# Patient Record
Sex: Male | Born: 1960
Health system: Southern US, Community
[De-identification: ages and names within clinical notes are randomized; demographics above are authoritative.]

## PROBLEM LIST (undated history)

## (undated) DIAGNOSIS — M1990 Unspecified osteoarthritis, unspecified site: Secondary | ICD-10-CM

## (undated) DIAGNOSIS — Z87891 Personal history of nicotine dependence: Secondary | ICD-10-CM

## (undated) DIAGNOSIS — I10 Essential (primary) hypertension: Secondary | ICD-10-CM

## (undated) DIAGNOSIS — I82409 Acute embolism and thrombosis of unspecified deep veins of unspecified lower extremity: Secondary | ICD-10-CM

## (undated) DIAGNOSIS — F329 Major depressive disorder, single episode, unspecified: Secondary | ICD-10-CM

## (undated) DIAGNOSIS — M5430 Sciatica, unspecified side: Secondary | ICD-10-CM

## (undated) DIAGNOSIS — G5600 Carpal tunnel syndrome, unspecified upper limb: Secondary | ICD-10-CM

## (undated) DIAGNOSIS — C61 Malignant neoplasm of prostate: Secondary | ICD-10-CM

## (undated) DIAGNOSIS — R319 Hematuria, unspecified: Secondary | ICD-10-CM

## (undated) DIAGNOSIS — M549 Dorsalgia, unspecified: Secondary | ICD-10-CM

## (undated) DIAGNOSIS — R011 Cardiac murmur, unspecified: Secondary | ICD-10-CM

## (undated) DIAGNOSIS — F32A Depression, unspecified: Secondary | ICD-10-CM

## (undated) DIAGNOSIS — E119 Type 2 diabetes mellitus without complications: Secondary | ICD-10-CM

## (undated) DIAGNOSIS — N529 Male erectile dysfunction, unspecified: Secondary | ICD-10-CM

## (undated) DIAGNOSIS — F419 Anxiety disorder, unspecified: Secondary | ICD-10-CM

## (undated) DIAGNOSIS — I213 ST elevation (STEMI) myocardial infarction of unspecified site: Secondary | ICD-10-CM

## (undated) DIAGNOSIS — R7611 Nonspecific reaction to tuberculin skin test without active tuberculosis: Secondary | ICD-10-CM

## (undated) DIAGNOSIS — M4800 Spinal stenosis, site unspecified: Secondary | ICD-10-CM

## (undated) DIAGNOSIS — E785 Hyperlipidemia, unspecified: Secondary | ICD-10-CM

## (undated) DIAGNOSIS — G8929 Other chronic pain: Secondary | ICD-10-CM

## (undated) DIAGNOSIS — M797 Fibromyalgia: Secondary | ICD-10-CM

## (undated) HISTORY — PX: BACK SURGERY: SHX140

## (undated) HISTORY — DX: Carpal tunnel syndrome, unspecified upper limb: G56.00

## (undated) HISTORY — DX: Spinal stenosis, site unspecified: M48.00

## (undated) HISTORY — DX: Sciatica, unspecified side: M54.30

---

## 1993-02-04 HISTORY — PX: APPENDECTOMY: SHX54

## 2001-02-04 HISTORY — PX: OTHER SURGICAL HISTORY: SHX169

## 2012-03-26 ENCOUNTER — Other Ambulatory Visit: Payer: Self-pay | Admitting: Urology

## 2012-04-08 ENCOUNTER — Encounter (HOSPITAL_BASED_OUTPATIENT_CLINIC_OR_DEPARTMENT_OTHER): Admission: RE | Payer: Self-pay | Source: Ambulatory Visit

## 2012-04-08 ENCOUNTER — Ambulatory Visit (HOSPITAL_BASED_OUTPATIENT_CLINIC_OR_DEPARTMENT_OTHER): Admission: RE | Admit: 2012-04-08 | Payer: Self-pay | Source: Ambulatory Visit | Admitting: Urology

## 2012-04-08 SURGERY — BIOPSY, PROSTATE, RECTAL APPROACH, WITH US GUIDANCE
Anesthesia: Monitor Anesthesia Care

## 2012-10-06 ENCOUNTER — Emergency Department (HOSPITAL_COMMUNITY): Payer: No Typology Code available for payment source

## 2012-10-06 ENCOUNTER — Emergency Department (HOSPITAL_COMMUNITY)
Admission: EM | Admit: 2012-10-06 | Discharge: 2012-10-06 | Disposition: A | Discharge status: Home / Self Care | Payer: No Typology Code available for payment source | Attending: Emergency Medicine | Admitting: Emergency Medicine

## 2012-10-06 ENCOUNTER — Encounter (HOSPITAL_COMMUNITY): Payer: Self-pay | Admitting: Emergency Medicine

## 2012-10-06 DIAGNOSIS — R1013 Epigastric pain: Secondary | ICD-10-CM | POA: Insufficient documentation

## 2012-10-06 DIAGNOSIS — E119 Type 2 diabetes mellitus without complications: Secondary | ICD-10-CM | POA: Insufficient documentation

## 2012-10-06 DIAGNOSIS — Z87891 Personal history of nicotine dependence: Secondary | ICD-10-CM | POA: Insufficient documentation

## 2012-10-06 DIAGNOSIS — R109 Unspecified abdominal pain: Secondary | ICD-10-CM

## 2012-10-06 DIAGNOSIS — Z9089 Acquired absence of other organs: Secondary | ICD-10-CM | POA: Insufficient documentation

## 2012-10-06 DIAGNOSIS — Z79899 Other long term (current) drug therapy: Secondary | ICD-10-CM | POA: Insufficient documentation

## 2012-10-06 DIAGNOSIS — R110 Nausea: Secondary | ICD-10-CM | POA: Insufficient documentation

## 2012-10-06 DIAGNOSIS — R739 Hyperglycemia, unspecified: Secondary | ICD-10-CM

## 2012-10-06 DIAGNOSIS — I10 Essential (primary) hypertension: Secondary | ICD-10-CM | POA: Insufficient documentation

## 2012-10-06 HISTORY — DX: Essential (primary) hypertension: I10

## 2012-10-06 LAB — URINALYSIS W MICROSCOPIC + REFLEX CULTURE
Glucose, UA: NEGATIVE mg/dL
Hgb urine dipstick: NEGATIVE
Ketones, ur: NEGATIVE mg/dL
Protein, ur: NEGATIVE mg/dL
Urine-Other: NONE SEEN
Urobilinogen, UA: 1 mg/dL (ref 0.0–1.0)

## 2012-10-06 LAB — CBC WITH DIFFERENTIAL/PLATELET
Basophils Relative: 0 % (ref 0–1)
Eosinophils Absolute: 0.2 10*3/uL (ref 0.0–0.7)
Eosinophils Relative: 3 % (ref 0–5)
Hemoglobin: 12.8 g/dL — ABNORMAL LOW (ref 13.0–17.0)
Lymphs Abs: 2.3 10*3/uL (ref 0.7–4.0)
MCH: 27.4 pg (ref 26.0–34.0)
MCHC: 32.3 g/dL (ref 30.0–36.0)
MCV: 84.8 fL (ref 78.0–100.0)
Monocytes Relative: 8 % (ref 3–12)
Neutrophils Relative %: 64 % (ref 43–77)
Platelets: 254 10*3/uL (ref 150–400)
RBC: 4.67 MIL/uL (ref 4.22–5.81)

## 2012-10-06 LAB — COMPREHENSIVE METABOLIC PANEL
GFR calc Af Amer: >90 mL/min (ref >90)
GFR calc non Af Amer: >90 mL/min (ref >90)
Potassium: 3.6 mEq/L (ref 3.5–5.1)
Sodium: 131 mEq/L — ABNORMAL LOW (ref 135–145)
Total Bilirubin: 0.8 mg/dL (ref 0.3–1.2)
Total Protein: 7.4 g/dL (ref 6.0–8.3)

## 2012-10-06 LAB — CG4 I-STAT (LACTIC ACID): Lactic Acid, Venous: 1.43 mmol/L (ref 0.5–2.2)

## 2012-10-06 MED ORDER — HYDROMORPHONE HCL PF 1 MG/ML IJ SOLN
0.5000 mg | Freq: Once | INTRAMUSCULAR | Status: AC
  Administered 2012-10-06: 0.5 mg via INTRAVENOUS
  Filled 2012-10-06: qty 1

## 2012-10-06 MED ORDER — IOHEXOL 300 MG/ML  SOLN
100.0000 mL | Freq: Once | INTRAMUSCULAR | Status: AC | PRN
  Administered 2012-10-06: 100 mL via INTRAVENOUS

## 2012-10-06 MED ORDER — IOHEXOL 300 MG/ML  SOLN
50.0000 mL | Freq: Once | INTRAMUSCULAR | Status: AC | PRN
  Administered 2012-10-06: 50 mL via ORAL

## 2012-10-06 MED ORDER — POLYETHYLENE GLYCOL 3350 17 GM/SCOOP PO POWD: 17.0000 g | Freq: Every day | ORAL | Status: DC

## 2012-10-06 MED ORDER — HYDROCODONE-ACETAMINOPHEN 5-325 MG PO TABS: 1.0000 | ORAL_TABLET | ORAL | Status: DC | PRN

## 2012-10-06 MED ORDER — FAMOTIDINE 20 MG PO TABS: 20.0000 mg | ORAL_TABLET | Freq: Two times a day (BID) | ORAL | Status: DC

## 2012-10-06 MED ORDER — ONDANSETRON HCL 4 MG/2ML IJ SOLN
4.0000 mg | Freq: Once | INTRAMUSCULAR | Status: AC
  Administered 2012-10-06: 4 mg via INTRAVENOUS
  Filled 2012-10-06: qty 2

## 2012-10-06 NOTE — ED Notes (Signed)
Patient is alert and oriented x3.  He was given DC instructions and follow up visit instructions.  Patient gave verbal understanding.  He was DC ambulatory under his own power to home.  V/S stable.  He was not showing any signs of distress on DC 

## 2012-10-06 NOTE — ED Provider Notes (Signed)
CSN: [id number]     Arrival date & time 10/06/12  1547 History   First MD Initiated Contact with Patient 10/06/12 1552     Chief Complaint  Patient presents with  . Abdominal Pain   (Consider location/radiation/quality/duration/timing/severity/associated sxs/prior Treatment) HPI [name] is a 52 y.o. male who presents to ED with complaint of abdominal pain. Pt states pain began last night. It is constant, rated 7/10, with episodes of sharp pain up to 10/10. States abdomen feels distended, associated nausea. Denies vomiting. States noted that his stools were black today. States that he did take pepto bismol for pain yesterday. Pt states he recently stopped metformin, and started on amaryl and not sure if that is what causing him to have pain. Pt went to PCP today and was sent here for evaluation.    Past Medical History  Diagnosis Date  . Diabetes mellitus without complication   . Hypertension    Past Surgical History  Procedure Laterality Date  . Appendectomy     No family history on file. History  Substance Use Topics  . Smoking status: Former Games developer  . Smokeless tobacco: Not on file  . Alcohol Use: Yes     Comment: social    Review of Systems  Constitutional: Negative for fever and chills.  HENT: Negative for neck pain and neck stiffness.   Respiratory: Negative for cough, chest tightness and shortness of breath.   Cardiovascular: Negative for chest pain, palpitations and leg swelling.  Gastrointestinal: Positive for nausea and abdominal pain. Negative for vomiting, diarrhea, constipation and abdominal distention.  Genitourinary: Negative for dysuria, urgency, frequency and hematuria.  Musculoskeletal: Negative for myalgias and arthralgias.  Skin: Negative for rash.  Allergic/Immunologic: Negative for immunocompromised state.  Neurological: Negative for dizziness, weakness, light-headedness, numbness and headaches.    Allergies  Review of patient's allergies  indicates no known allergies.  Home Medications   Current Outpatient Rx  Name  Route  Sig  Dispense  Refill  . bismuth subsalicylate (PEPTO BISMOL) 262 MG/15ML suspension   Oral   Take 15 mLs by mouth every 6 (six) hours as needed for indigestion.         Marland Kitchen glimepiride (AMARYL) 1 MG tablet   Oral   Take 1 mg by mouth daily before breakfast.         . lisinopril (PRINIVIL,ZESTRIL) 10 MG tablet   Oral   Take 10 mg by mouth daily.         . nabumetone (RELAFEN) 500 MG tablet   Oral   Take 500 mg by mouth 2 (two) times daily.         Marland Kitchen oxymetazoline (AFRIN) 0.05 % nasal spray   Nasal   Place 1 spray into the nose 2 (two) times daily.          There were no vitals taken for this visit. Physical Exam  Nursing note and vitals reviewed. Constitutional: He appears well-developed and well-nourished. No distress.  HENT:  Head: Normocephalic and atraumatic.  Eyes: Conjunctivae are normal.  Neck: Neck supple.  Cardiovascular: Normal rate, regular rhythm and normal heart sounds.   Pulmonary/Chest: Effort normal. No respiratory distress. He has no wheezes. He has no rales.  Abdominal: He exhibits distension. There is tenderness. There is guarding. There is no rebound.  Diffuse tenderness, with most tender area in epigastric region.   Musculoskeletal: He exhibits no edema.  Neurological: He is alert.  Skin: Skin is warm and dry.    ED Course  Procedures (including critical care time) Results for orders placed during the hospital encounter of 10/06/12  COMPREHENSIVE METABOLIC PANEL      Result Value Range   Sodium 131 (*) 135 - 145 mEq/L   Potassium 3.6  3.5 - 5.1 mEq/L   Chloride 99  96 - 112 mEq/L   CO2 24  19 - 32 mEq/L   Glucose, Bld 194 (*) 70 - 99 mg/dL   BUN 10  6 - 23 mg/dL   Creatinine, Ser 1.61  0.50 - 1.35 mg/dL   Calcium 8.4  8.4 - 09.6 mg/dL   Total Protein 7.4  6.0 - 8.3 g/dL   Albumin 3.4 (*) 3.5 - 5.2 g/dL   AST 16  0 - 37 U/L   ALT 17  0 - 53 U/L    Alkaline Phosphatase 73  39 - 117 U/L   Total Bilirubin 0.8  0.3 - 1.2 mg/dL   GFR calc non Af Amer >90  >90 mL/min   GFR calc Af Amer >90  >90 mL/min  LIPASE, BLOOD      Result Value Range   Lipase 34  11 - 59 U/L  CBC WITH DIFFERENTIAL      Result Value Range   WBC 8.6  4.0 - 10.5 K/uL   RBC 4.67  4.22 - 5.81 MIL/uL   Hemoglobin 12.8 (*) 13.0 - 17.0 g/dL   HCT 04.5  40.9 - 81.1 %   MCV 84.8  78.0 - 100.0 fL   MCH 27.4  26.0 - 34.0 pg   MCHC 32.3  30.0 - 36.0 g/dL   RDW 91.4  78.2 - 95.6 %   Platelets 254  150 - 400 K/uL   Neutrophils Relative % 64  43 - 77 %   Neutro Abs 5.5  1.7 - 7.7 K/uL   Lymphocytes Relative 26  12 - 46 %   Lymphs Abs 2.3  0.7 - 4.0 K/uL   Monocytes Relative 8  3 - 12 %   Monocytes Absolute 0.7  0.1 - 1.0 K/uL   Eosinophils Relative 3  0 - 5 %   Eosinophils Absolute 0.2  0.0 - 0.7 K/uL   Basophils Relative 0  0 - 1 %   Basophils Absolute 0.0  0.0 - 0.1 K/uL  CG4 I-STAT (LACTIC ACID)      Result Value Range   Lactic Acid, Venous 1.43  0.5 - 2.2 mmol/L  OCCULT BLOOD, POC DEVICE      Result Value Range   Fecal Occult Bld NEGATIVE  NEGATIVE   Ct Abdomen Pelvis W Contrast  10/06/2012   *RADIOLOGY REPORT*  Clinical Data: Abdominal pain centered at left lower quadrant with rebound.  Change in bowel habits.  Diabetes.  Hypertension.  CT ABDOMEN AND PELVIS WITH CONTRAST  Technique:  Multidetector CT imaging of the abdomen and pelvis was performed following the standard protocol during bolus administration of intravenous contrast.  Contrast: 50mL OMNIPAQUE IOHEXOL 300 MG/ML  SOLN, OMNIPAQUE IOHEXOL 300 MG/ML  SOLN  Comparison: Plain films earlier today.  No prior CT.  Findings: Lung bases:  Clear lung bases.  Normal heart size without pericardial or pleural effusion.  Abdomen/pelvis:  Mild hepatic steatosis.  Mild hepatomegaly, 19.6 cm cranial caudal.  Mild motion degradation in the upper abdomen. Normal spleen, stomach, pancreas, gallbladder, biliary tract,  adrenal glands.  Interpolar 9 mm left renal lesion which is too small to characterize.  Likely a cyst or minimally complex cyst. Normal right  kidney.  No retroperitoneal or retrocrural adenopathy.  Normal colon and terminal ileum. Appendix surgically absent. Normal small bowel without abdominal ascites.    No pelvic adenopathy.    Normal urinary bladder and prostate.  No significant free fluid.  Bones/Musculoskeletal:  Incomplete fusion of the posterior elements at L5. No acute osseous abnormality.  IMPRESSION:  1. No acute process in the abdomen or pelvis. 2.  Mild hepatic steatosis and hepatomegaly.   Original Report Authenticated By: Jeronimo Greaves, M.D.   Dg Abd Acute W/chest  10/06/2012   CLINICAL DATA:  52 year old male abdominal pain.  EXAM: ACUTE ABDOMEN SERIES (ABDOMEN 2 VIEW & CHEST 1 VIEW)  COMPARISON:  Lumbar radiographs 06/24/2012.  FINDINGS: Mild cardiomegaly. Other mediastinal contours are within normal limits. No pneumothorax or pneumoperitoneum. No confluent pulmonary opacity.  Non obstructed bowel gas pattern. Abdominal and pelvic visceral contours are within normal limits. No acute osseous abnormality identified.  IMPRESSION: Non obstructed bowel gas pattern, no free air.  No acute cardiopulmonary abnormality.   Electronically Signed   By: Augusto Gamble   On: 10/06/2012 18:00      MDM   1. Abdominal pain   2. Hyperglycemia      PT in ED with abdominal pain onset last night. Nausea. Abdominal distention. Sent from PCP's office. On exam, initially pt's abdomen is distended, tender diffusely with main pain in epigastric area. Some guarding. CT and labs obtained.   CT normal. CBC including WBC normal. The rest of the labs unremarkable. UA shows elevated specific gravity otherwise unremarkable. Hemoccult negative negative lactic acid. Given the above findings I do not suspect a surgical abdomen. A shunt will need further followup by primary care doctor gastroenterologist. Patient will be  discharged home with pain medications, Pepcid, followup is as able.  Filed Vitals:   10/06/12 1821  BP: 131/88  Pulse: 75  Resp: 18  SpO2: 99%     Lottie Mussel, PA-C 10/07/12 0056

## 2012-10-06 NOTE — ED Notes (Signed)
Per EMS pt comes from [place] [facility] c/o abdominal pain started in [place] and has rebound, abd is distended.  Pain starts lower then intermittently radiates sharply up to epigastric area. Pt denies chest pain and ShoB.  Pt recently was stopped of Metformin due to pain in his body.  Pt states that he had dark tarry stools yesterday that were loose and abd pain started last night. Pt also states that when he urinates the flow is slower than usual, denies pain.

## 2012-10-07 LAB — URINALYSIS W MICROSCOPIC + REFLEX CULTURE

## 2012-10-14 NOTE — ED Provider Notes (Signed)
Medical screening examination/treatment/procedure(s) were performed by non-physician practitioner and as supervising physician I was immediately available for consultation/collaboration. Devoria Albe, MD, Armando Gang   Ward Givens, MD 10/14/12 [phone]

## 2012-12-16 ENCOUNTER — Encounter: Payer: Self-pay | Admitting: Physical Medicine & Rehabilitation

## 2012-12-16 ENCOUNTER — Other Ambulatory Visit: Payer: Self-pay | Admitting: Rheumatology

## 2012-12-16 DIAGNOSIS — M545 Low back pain: Secondary | ICD-10-CM

## 2012-12-22 ENCOUNTER — Ambulatory Visit
Admission: RE | Admit: 2012-12-22 | Discharge: 2012-12-22 | Disposition: A | Discharge status: Home / Self Care | Payer: No Typology Code available for payment source | Source: Ambulatory Visit | Attending: Rheumatology | Admitting: Rheumatology

## 2012-12-22 DIAGNOSIS — M545 Low back pain: Secondary | ICD-10-CM

## 2012-12-24 ENCOUNTER — Other Ambulatory Visit: Payer: Self-pay | Admitting: Rheumatology

## 2012-12-24 DIAGNOSIS — M4800 Spinal stenosis, site unspecified: Secondary | ICD-10-CM

## 2012-12-28 ENCOUNTER — Ambulatory Visit
Admission: RE | Admit: 2012-12-28 | Discharge: 2012-12-28 | Disposition: A | Discharge status: Home / Self Care | Payer: No Typology Code available for payment source | Source: Ambulatory Visit | Attending: Rheumatology | Admitting: Rheumatology

## 2012-12-28 DIAGNOSIS — M4800 Spinal stenosis, site unspecified: Secondary | ICD-10-CM

## 2012-12-28 MED ORDER — IOHEXOL 180 MG/ML  SOLN
1.0000 mL | Freq: Once | INTRAMUSCULAR | Status: AC | PRN
  Administered 2012-12-28: 1 mL via EPIDURAL

## 2012-12-28 MED ORDER — METHYLPREDNISOLONE ACETATE 40 MG/ML INJ SUSP (RADIOLOG
120.0000 mg | Freq: Once | INTRAMUSCULAR | Status: AC
  Administered 2012-12-28: 120 mg via EPIDURAL

## 2013-01-17 ENCOUNTER — Emergency Department (HOSPITAL_COMMUNITY): Payer: Self-pay

## 2013-01-17 ENCOUNTER — Encounter (HOSPITAL_COMMUNITY): Payer: Self-pay | Admitting: Emergency Medicine

## 2013-01-17 ENCOUNTER — Emergency Department (HOSPITAL_COMMUNITY)
Admission: EM | Admit: 2013-01-17 | Discharge: 2013-01-17 | Disposition: A | Discharge status: Home / Self Care | Payer: Self-pay | Attending: Emergency Medicine | Admitting: Emergency Medicine

## 2013-01-17 DIAGNOSIS — I10 Essential (primary) hypertension: Secondary | ICD-10-CM | POA: Insufficient documentation

## 2013-01-17 DIAGNOSIS — R0789 Other chest pain: Secondary | ICD-10-CM | POA: Insufficient documentation

## 2013-01-17 DIAGNOSIS — Z87891 Personal history of nicotine dependence: Secondary | ICD-10-CM | POA: Insufficient documentation

## 2013-01-17 DIAGNOSIS — R079 Chest pain, unspecified: Secondary | ICD-10-CM

## 2013-01-17 DIAGNOSIS — E119 Type 2 diabetes mellitus without complications: Secondary | ICD-10-CM | POA: Insufficient documentation

## 2013-01-17 DIAGNOSIS — Z79899 Other long term (current) drug therapy: Secondary | ICD-10-CM | POA: Insufficient documentation

## 2013-01-17 LAB — BASIC METABOLIC PANEL
BUN: 14 mg/dL (ref 6–23)
CO2: 24 mEq/L (ref 19–32)
Calcium: 9.2 mg/dL (ref 8.4–10.5)
Creatinine, Ser: 1.01 mg/dL (ref 0.50–1.35)
Glucose, Bld: 269 mg/dL — ABNORMAL HIGH (ref 70–99)
Potassium: 4 mEq/L (ref 3.5–5.1)

## 2013-01-17 LAB — POCT I-STAT TROPONIN I: Troponin i, poc: 0 ng/mL (ref 0.00–0.08)

## 2013-01-17 LAB — CBC
HCT: 40.5 % (ref 39.0–52.0)
Hemoglobin: 13.6 g/dL (ref 13.0–17.0)
MCH: 28.4 pg (ref 26.0–34.0)
MCHC: 33.6 g/dL (ref 30.0–36.0)
MCV: 84.6 fL (ref 78.0–100.0)
Platelets: 261 10*3/uL (ref 150–400)
RBC: 4.79 MIL/uL (ref 4.22–5.81)
RDW: 12.3 % (ref 11.5–15.5)

## 2013-01-17 MED ORDER — NAPROXEN 375 MG PO TABS: 375.0000 mg | ORAL_TABLET | Freq: Two times a day (BID) | ORAL | Status: DC | PRN

## 2013-01-17 MED ORDER — KETOROLAC TROMETHAMINE 30 MG/ML IJ SOLN
30.0000 mg | Freq: Once | INTRAMUSCULAR | Status: AC
  Administered 2013-01-17: 30 mg via INTRAVENOUS
  Filled 2013-01-17: qty 1

## 2013-01-17 MED ORDER — LORAZEPAM 2 MG/ML IJ SOLN
1.0000 mg | Freq: Once | INTRAMUSCULAR | Status: AC
  Administered 2013-01-17: 1 mg via INTRAVENOUS
  Filled 2013-01-17: qty 1

## 2013-01-17 NOTE — ED Notes (Signed)
Pt complains of R chest pain since yesterday evening along with sob and lightheadedness. Pt describes pain as sharp, no aggravating/relieving factors. Pt also has bilateral shoulder pain, HA and leg numbness. Pt has chronic back pain. Pain worse with palpation.

## 2013-01-17 NOTE — ED Provider Notes (Signed)
CSN: [id number]     Arrival date & time 01/17/13  4098 History   First MD Initiated Contact with Patient 01/17/13 [phone]     Chief Complaint  Patient presents with  . Chest Pain   (Consider location/radiation/quality/duration/timing/severity/associated sxs/prior Treatment) HPI  52 year old male with chest pain. Gradual onset yesterday afternoon while sitting on the couch watching TV. Pain is along the right parasternal border. Patient describes the pain as sharp. It is constant. Does not radiate. No appreciable exacerbating relieving factors. No shortness of breath, palpitations, diaphoresis or nausea. No history similar pain. No fevers or chills. No cough. No unusual leg pain or swelling. No diagnosed history of coronary artery disease. He does have a history of diabetes, hypertension and a former smoker.  Past Medical History  Diagnosis Date  . Diabetes mellitus without complication   . Hypertension    Past Surgical History  Procedure Laterality Date  . Appendectomy     History reviewed. No pertinent family history. History  Substance Use Topics  . Smoking status: Former Games developer  . Smokeless tobacco: Not on file  . Alcohol Use: Yes     Comment: social    Review of Systems  All systems reviewed and negative, other than as noted in HPI.   Allergies  Review of patient's allergies indicates no known allergies.  Home Medications   Current Outpatient Rx  Name  Route  Sig  Dispense  Refill  . bismuth subsalicylate (PEPTO BISMOL) 262 MG/15ML suspension   Oral   Take 15 mLs by mouth every 6 (six) hours as needed for indigestion.         . famotidine (PEPCID) 20 MG tablet   Oral   Take 1 tablet (20 mg total) by mouth 2 (two) times daily.   30 tablet   0   . glimepiride (AMARYL) 1 MG tablet   Oral   Take 1 mg by mouth daily before breakfast.         . HYDROcodone-acetaminophen (NORCO/VICODIN) 5-325 MG per tablet   Oral   Take 1 tablet by mouth every 4 (four) hours  as needed for pain.   15 tablet   0   . lisinopril (PRINIVIL,ZESTRIL) 10 MG tablet   Oral   Take 10 mg by mouth daily.         . nabumetone (RELAFEN) 500 MG tablet   Oral   Take 500 mg by mouth 2 (two) times daily.         Marland Kitchen oxymetazoline (AFRIN) 0.05 % nasal spray   Nasal   Place 1 spray into the nose 2 (two) times daily.         . polyethylene glycol powder (MIRALAX) powder   Oral   Take 17 g by mouth daily.   255 g   0    BP 142/92  Pulse 88  Temp(Src) 98.3 F (36.8 C) (Oral)  Resp 20  SpO2 99% Physical Exam  Nursing note and vitals reviewed. Constitutional: He appears well-developed and well-nourished. No distress.  HENT:  Head: Normocephalic and atraumatic.  Eyes: Conjunctivae are normal. Right eye exhibits no discharge. Left eye exhibits no discharge.  Neck: Neck supple.  Cardiovascular: Normal rate, regular rhythm and normal heart sounds.  Exam reveals no gallop and no friction rub.   No murmur heard. Pulmonary/Chest: Effort normal and breath sounds normal. No respiratory distress. He exhibits tenderness.  Tenderness along the right parasternal border. No concerning skin changes noted.  Abdominal: Soft. He exhibits no  distension. There is no tenderness.  Musculoskeletal: He exhibits no edema and no tenderness.  Lower extremities symmetric as compared to each other. No calf tenderness. Negative Homan's. No palpable cords.   Neurological: He is alert.  Skin: Skin is warm and dry.  Psychiatric: He has a normal mood and affect. His behavior is normal. Thought content normal.    ED Course  Procedures (including critical care time) Labs Review Labs Reviewed  BASIC METABOLIC PANEL - Abnormal; Notable for the following:    Sodium 132 (*)    Glucose, Bld 269 (*)    GFR calc non Af Amer 85 (*)    All other components within normal limits  CBC  PRO B NATRIURETIC PEPTIDE  POCT I-STAT TROPONIN I   Imaging Review  No results found. No results  found.  Dg Chest 2 View  01/17/2013   CLINICAL DATA:  Right chest pain  EXAM: CHEST  2 VIEW  COMPARISON:  10/06/2012  FINDINGS: The heart size and mediastinal contours are within normal limits. Both lungs are clear. The visualized skeletal structures are unremarkable.  IMPRESSION: No active cardiopulmonary disease.   Electronically Signed   By: Ruel Favors M.D.   On: 01/17/2013 09:48     EKG Interpretation    Date/Time:  Sunday January 17 2013 09:04:30 EST Ventricular Rate:  86 PR Interval:  163 QRS Duration: 90 QT Interval:  352 QTC Calculation: 421 R Axis:   43 Text Interpretation:  Sinus rhythm EARLY R WAVE PROGRESSION NSST changes, t wave flattening multiple leads No old tracing to compare Confirmed by Kenetra Hildenbrand  MD, Daymeon Fischman ([id number]) on 01/17/2013 9:19:57 AM            MDM   1. Chest pain    52 year old male with chest pain. Very atypical for ACS given constant duration for more than a day in such reproducibility with palpation. Suspect musculoskeletal etiology such as costochondritis and will treat as such at this time. Doubt pulmonary embolism, infectious or dissection.     Raeford Razor, MD 01/20/13 [phone]

## 2013-01-19 ENCOUNTER — Ambulatory Visit: Payer: No Typology Code available for payment source | Admitting: Physical Medicine & Rehabilitation

## 2013-01-28 ENCOUNTER — Emergency Department (HOSPITAL_COMMUNITY)
Admission: EM | Admit: 2013-01-28 | Discharge: 2013-01-28 | Disposition: A | Discharge status: Home / Self Care | Payer: No Typology Code available for payment source | Attending: Emergency Medicine | Admitting: Emergency Medicine

## 2013-01-28 ENCOUNTER — Encounter (HOSPITAL_COMMUNITY): Payer: Self-pay | Admitting: Emergency Medicine

## 2013-01-28 DIAGNOSIS — Z87891 Personal history of nicotine dependence: Secondary | ICD-10-CM | POA: Insufficient documentation

## 2013-01-28 DIAGNOSIS — M545 Low back pain, unspecified: Secondary | ICD-10-CM | POA: Insufficient documentation

## 2013-01-28 DIAGNOSIS — E119 Type 2 diabetes mellitus without complications: Secondary | ICD-10-CM | POA: Insufficient documentation

## 2013-01-28 DIAGNOSIS — F3289 Other specified depressive episodes: Secondary | ICD-10-CM | POA: Insufficient documentation

## 2013-01-28 DIAGNOSIS — F329 Major depressive disorder, single episode, unspecified: Secondary | ICD-10-CM

## 2013-01-28 DIAGNOSIS — Z79899 Other long term (current) drug therapy: Secondary | ICD-10-CM | POA: Insufficient documentation

## 2013-01-28 DIAGNOSIS — Z791 Long term (current) use of non-steroidal anti-inflammatories (NSAID): Secondary | ICD-10-CM | POA: Insufficient documentation

## 2013-01-28 DIAGNOSIS — I10 Essential (primary) hypertension: Secondary | ICD-10-CM | POA: Insufficient documentation

## 2013-01-28 DIAGNOSIS — G8929 Other chronic pain: Secondary | ICD-10-CM | POA: Insufficient documentation

## 2013-01-28 MED ORDER — DIAZEPAM 5 MG PO TABS
5.0000 mg | ORAL_TABLET | Freq: Once | ORAL | Status: AC
  Administered 2013-01-28: 5 mg via ORAL
  Filled 2013-01-28: qty 1

## 2013-01-28 MED ORDER — OXYCODONE-ACETAMINOPHEN 5-325 MG PO TABS
2.0000 | ORAL_TABLET | Freq: Once | ORAL | Status: AC
  Administered 2013-01-28: 2 via ORAL
  Filled 2013-01-28: qty 2

## 2013-01-28 MED ORDER — DIAZEPAM 5 MG PO TABS: 5.0000 mg | ORAL_TABLET | Freq: Three times a day (TID) | ORAL | Status: DC | PRN

## 2013-01-28 NOTE — ED Notes (Signed)
Pt arrived to the Ed with a complaint of pain in his spine, legs, wrists.  Pt has spinal setosus.  Pt has codeine, naproxen for the pain but it has been ineffective.  Pt's pain began to become severe yesterday and he has been unable to sleep last night due to the pain.

## 2013-01-28 NOTE — ED Provider Notes (Signed)
CSN: [id number]     Arrival date & time 01/28/13  7829 History   First MD Initiated Contact with Patient 01/28/13 [phone]     Chief Complaint  Patient presents with  . Back Pain   (Consider location/radiation/quality/duration/timing/severity/associated sxs/prior Treatment) HPI Comments: Patient has been treated by Rheumatologist for chronic pain. Patient states his codeine hasn't been working. Patient has depression, denies SI.  Patient is a 52 y.o. male presenting with back pain. The history is provided by the patient.  Back Pain Location:  Thoracic spine and lumbar spine Quality:  Aching Radiates to:  Does not radiate Pain severity:  Moderate Pain is:  Same all the time Onset quality:  Sudden Timing:  Constant Progression:  Worsening Chronicity:  Chronic Context: not MCA, not MVA, not occupational injury, not recent illness and not recent injury   Relieved by:  Nothing Worsened by:  Nothing tried Associated symptoms: no abdominal pain and no fever     Past Medical History  Diagnosis Date  . Diabetes mellitus without complication   . Hypertension    Past Surgical History  Procedure Laterality Date  . Appendectomy     History reviewed. No pertinent family history. History  Substance Use Topics  . Smoking status: Former Games developer  . Smokeless tobacco: Not on file  . Alcohol Use: Yes     Comment: social    Review of Systems  Constitutional: Negative for fever.  Respiratory: Negative for cough and shortness of breath.   Gastrointestinal: Negative for nausea, vomiting and abdominal pain.  Musculoskeletal: Positive for back pain.  All other systems reviewed and are negative.    Allergies  Review of patient's allergies indicates no known allergies.  Home Medications   Current Outpatient Rx  Name  Route  Sig  Dispense  Refill  . glimepiride (AMARYL) 1 MG tablet   Oral   Take 1 mg by mouth daily before breakfast.         . HYDROcodone-acetaminophen  (NORCO/VICODIN) 5-325 MG per tablet   Oral   Take 1 tablet by mouth every 4 (four) hours as needed for pain.   15 tablet   0   . ibuprofen (ADVIL,MOTRIN) 200 MG tablet   Oral   Take 400 mg by mouth every 6 (six) hours as needed.         Marland Kitchen lisinopril (PRINIVIL,ZESTRIL) 10 MG tablet   Oral   Take 10 mg by mouth daily.         . nabumetone (RELAFEN) 500 MG tablet   Oral   Take 500 mg by mouth 2 (two) times daily.         . naproxen (NAPROSYN) 375 MG tablet   Oral   Take 1 tablet (375 mg total) by mouth 2 (two) times daily as needed.   30 tablet   0   . oxymetazoline (AFRIN) 0.05 % nasal spray   Nasal   Place 1 spray into the nose 2 (two) times daily.         . polyethylene glycol powder (MIRALAX) powder   Oral   Take 17 g by mouth daily.   255 g   0    BP 155/91  Pulse 85  Temp(Src) 98.3 F (36.8 C) (Oral)  Resp 20  Ht 5\' 11"  (1.803 m)  Wt 260 lb (117.935 kg)  BMI 36.28 kg/m2  SpO2 95% Physical Exam  Nursing note and vitals reviewed. Constitutional: He is oriented to person, place, and time. He appears well-developed  and well-nourished. No distress.  HENT:  Head: Normocephalic and atraumatic.  Mouth/Throat: No oropharyngeal exudate.  Eyes: EOM are normal. Pupils are equal, round, and reactive to light.  Neck: Normal range of motion. Neck supple.  Cardiovascular: Normal rate and regular rhythm.  Exam reveals no friction rub.   No murmur heard. Pulmonary/Chest: Effort normal and breath sounds normal. No respiratory distress. He has no wheezes. He has no rales.  Abdominal: He exhibits no distension. There is no tenderness. There is no rebound.  Musculoskeletal: Normal range of motion. He exhibits no edema.       Thoracic back: He exhibits spasm (in mid back).  Neurological: He is alert and oriented to person, place, and time. No cranial nerve deficit. He exhibits normal muscle tone. Coordination normal.  Skin: No rash noted. He is not diaphoretic.    ED  Course  Procedures (including critical care time) Labs Review Labs Reviewed - No data to display Imaging Review No results found.  EKG Interpretation   None       MDM   1. Chronic back pain   2. Depression    52 year old male presents with chronic pain. He's been followed by Dr. Kellie Simmering, who is a rheumatologist. Patient states he has spinal stenosis, carpal tunnel syndrome. He states he's on codeine 3 times a day is not helping his pain. Is also reporting depression. He denies any suicidal ideation or homicidal ideation. He really wants relief from his pain and wants some help with his depression. Patient denies any falls, acute worsening of his chronic pain from any trauma, fevers, nausea, vomiting. Patient's vitals are stable here. Exam is benign. We will give him 2 Percocet for pain relief.. I instructed him to continue taking his codeine at home as prescribed. Also given prescription for sign of muscle spasm as he reports muscle tightening in his back. We'll give him resource guide for followup for psychiatry. He is stable for discharge.    Dagmar Hait, MD 01/28/13 [phone]

## 2013-01-28 NOTE — ED Notes (Signed)
Pt reports pain in his lower back which radiates to bilateral legs. Pt also sts weakness in legs, reports hx of spinal stenosis.

## 2013-02-17 ENCOUNTER — Encounter (HOSPITAL_COMMUNITY): Payer: Self-pay | Admitting: Emergency Medicine

## 2013-02-17 ENCOUNTER — Emergency Department (HOSPITAL_COMMUNITY)
Admission: EM | Admit: 2013-02-17 | Discharge: 2013-02-17 | Disposition: A | Discharge status: Home / Self Care | Payer: No Typology Code available for payment source | Attending: Emergency Medicine | Admitting: Emergency Medicine

## 2013-02-17 DIAGNOSIS — IMO0001 Reserved for inherently not codable concepts without codable children: Secondary | ICD-10-CM | POA: Insufficient documentation

## 2013-02-17 DIAGNOSIS — Z79899 Other long term (current) drug therapy: Secondary | ICD-10-CM | POA: Insufficient documentation

## 2013-02-17 DIAGNOSIS — I10 Essential (primary) hypertension: Secondary | ICD-10-CM | POA: Insufficient documentation

## 2013-02-17 DIAGNOSIS — Z87891 Personal history of nicotine dependence: Secondary | ICD-10-CM | POA: Insufficient documentation

## 2013-02-17 DIAGNOSIS — M4800 Spinal stenosis, site unspecified: Secondary | ICD-10-CM

## 2013-02-17 DIAGNOSIS — G8929 Other chronic pain: Secondary | ICD-10-CM

## 2013-02-17 DIAGNOSIS — Z791 Long term (current) use of non-steroidal anti-inflammatories (NSAID): Secondary | ICD-10-CM | POA: Insufficient documentation

## 2013-02-17 DIAGNOSIS — M25519 Pain in unspecified shoulder: Secondary | ICD-10-CM | POA: Insufficient documentation

## 2013-02-17 DIAGNOSIS — F329 Major depressive disorder, single episode, unspecified: Secondary | ICD-10-CM | POA: Insufficient documentation

## 2013-02-17 DIAGNOSIS — M5430 Sciatica, unspecified side: Secondary | ICD-10-CM

## 2013-02-17 DIAGNOSIS — E119 Type 2 diabetes mellitus without complications: Secondary | ICD-10-CM | POA: Insufficient documentation

## 2013-02-17 DIAGNOSIS — F3289 Other specified depressive episodes: Secondary | ICD-10-CM | POA: Insufficient documentation

## 2013-02-17 MED ORDER — OXYCODONE-ACETAMINOPHEN 5-325 MG PO TABS: 1.0000 | ORAL_TABLET | Freq: Four times a day (QID) | ORAL | Status: DC | PRN

## 2013-02-17 MED ORDER — PREDNISONE 20 MG PO TABS: ORAL_TABLET | ORAL | Status: DC

## 2013-02-17 NOTE — ED Notes (Signed)
Patient c/o of generalized body aches, unwilling to sit at triage due to pain.  States he has previous diagnosis of spinal stenosis and carpal tunnel syndrome.  C/o radiating pain down leg bilaterally , thigh pain and buttock pain, shoulder pain was being seen for same and put out of work but has since lost insurance and has been unable to follow up.  C/o of numb fingers and toes in all extremities.  C/o pain in back when flexing neck

## 2013-02-17 NOTE — Discharge Instructions (Signed)
Take percocet for severe pain only. No driving or operating heavy machinery while taking percocet. This medication may cause drowsiness. Take prednisone as directed. Follow up with your doctor. It is important to monitor your blood sugar closely while on prednisone. Back Pain, Adult Back pain is very common. The pain often gets better over time. The cause of back pain is usually not dangerous. Most people can learn to manage their back pain on their own.  HOME CARE   Stay active. Start with short walks on flat ground if you can. Try to walk farther each day.  Do not sit, drive, or stand in one place for more than 30 minutes. Do not stay in bed.  Do not avoid exercise or work. Activity can help your back heal faster.  Be careful when you bend or lift an object. Bend at your knees, keep the object close to you, and do not twist.  Sleep on a firm mattress. Lie on your side, and bend your knees. If you lie on your back, put a pillow under your knees.  Only take medicines as told by your doctor.  Put ice on the injured area.  Put ice in a plastic bag.  Place a towel between your skin and the bag.  Leave the ice on for 15-20 minutes, 03-04 times a day for the first 2 to 3 days. After that, you can switch between ice and heat packs.  Ask your doctor about back exercises or massage.  Avoid feeling anxious or stressed. Find good ways to deal with stress, such as exercise. GET HELP RIGHT AWAY IF:   Your pain does not go away with rest or medicine.  Your pain does not go away in 1 week.  You have new problems.  You do not feel well.  The pain spreads into your legs.  You cannot control when you poop (bowel movement) or pee (urinate).  Your arms or legs feel weak or lose feeling (numbness).  You feel sick to your stomach (nauseous) or throw up (vomit).  You have belly (abdominal) pain.  You feel like you may pass out (faint). MAKE SURE YOU:   Understand these  instructions.  Will watch your condition.  Will get help right away if you are not doing well or get worse. Document Released: 07/10/2007 Document Revised: 04/15/2011 Document Reviewed: 06/11/2010 [facility] Patient Information 2014 [place].  Chronic Pain Chronic pain can be defined as pain that is off and on and lasts for 3 6 months or longer. Many things cause chronic pain, which can make it difficult to make a diagnosis. There are many treatment options available for chronic pain. However, finding a treatment that works well for you may require trying various approaches until the right one is found. Many people benefit from a combination of two or more types of treatment to control their pain. SYMPTOMS  Chronic pain can occur anywhere in the body and can range from mild to very severe. Some types of chronic pain include:  Headache.  Low back pain.  Cancer pain.  Arthritis pain.  Neurogenic pain. This is pain resulting from damage to nerves. People with chronic pain may also have other symptoms such as:  Depression.  Anger.  Insomnia.  Anxiety. DIAGNOSIS  Your health care provider will help diagnose your condition over time. In many cases, the initial focus will be on excluding possible conditions that could be causing the pain. Depending on your symptoms, your health care provider may order  tests to diagnose your condition. Some of these tests may include:   Blood tests.   CT scan.   MRI.   X-rays.   Ultrasounds.   Nerve conduction studies.  You may need to see a specialist.  TREATMENT  Finding treatment that works well may take time. You may be referred to a pain specialist. He or she may prescribe medicine or therapies, such as:   Mindful meditation or yoga.  Shots (injections) of numbing or pain-relieving medicines into the spine or area of pain.  Local electrical stimulation.  Acupuncture.   Massage therapy.   Aroma, color, light, or  sound therapy.   Biofeedback.   Working with a physical therapist to keep from getting stiff.   Regular, gentle exercise.   Cognitive or behavioral therapy.   Group support.  Sometimes, surgery may be recommended.  HOME CARE INSTRUCTIONS   Take all medicines as directed by your health care provider.   Lessen stress in your life by relaxing and doing things such as listening to calming music.   Exercise or be active as directed by your health care provider.   Eat a healthy diet and include things such as vegetables, fruits, fish, and lean meats in your diet.   Keep all follow-up appointments with your health care provider.   Attend a support group with others suffering from chronic pain. SEEK MEDICAL CARE IF:   Your pain gets worse.   You develop a new pain that was not there before.   You cannot tolerate medicines given to you by your health care provider.   You have new symptoms since your last visit with your health care provider.  SEEK IMMEDIATE MEDICAL CARE IF:   You feel weak.   You have decreased sensation or numbness.   You lose control of bowel or bladder function.   Your pain suddenly gets much worse.   You develop shaking.  You develop chills.  You develop confusion.  You develop chest pain.  You develop shortness of breath.  MAKE SURE YOU:  Understand these instructions.  Will watch your condition.  Will get help right away if you are not doing well or get worse. Document Released: 10/13/2001 Document Revised: 09/23/2012 Document Reviewed: 07/17/2012 [facility] Patient Information 2014 [place].

## 2013-02-17 NOTE — ED Provider Notes (Signed)
CSN: [id number]     Arrival date & time 02/17/13  0820 History   First MD Initiated Contact with Patient 02/17/13 [phone]     Chief Complaint  Patient presents with  . Generalized Body Aches   (Consider location/radiation/quality/duration/timing/severity/associated sxs/prior Treatment) HPI Comments: Pt is a 53 y/o male with a PMHx of DM, HTN and spinal stenosis who presents to the ED complaining of generalized body aches, back pain, shoulder pain, hip pain which has been going on for a few months, has been managed by PCP, however out of work and lost insurance and has been unable to f/u with neurosurgeon due to lack of insurance. He is complaining of radiating pain down bilateral legs, left worse than right, left buttock pain and bilateral shoulder pain. States he feels weak, gets occasional numbness and tingling down extremities. Pain worse when sitting or walking, alleviated by laying down. He has been taking vicodin with minimal relief of his symptoms along with valium. States he is depressed because he is not working and cannot care for his wife as he used to. Denies loss of control of bowels or bladder or saddle anesthesia. Denies fever, chills or night sweats. No hx of IV drug abuse. Denies SI/HI.  The history is provided by the patient.    Past Medical History  Diagnosis Date  . Diabetes mellitus without complication   . Hypertension    Past Surgical History  Procedure Laterality Date  . Appendectomy     No family history on file. History  Substance Use Topics  . Smoking status: Former Research scientist (life sciences)  . Smokeless tobacco: Not on file  . Alcohol Use: Yes     Comment: social    Review of Systems  Constitutional: Positive for activity change.  Musculoskeletal: Positive for arthralgias, back pain and myalgias.  All other systems reviewed and are negative.    Allergies  Review of patient's allergies indicates no known allergies.  Home Medications   Current Outpatient Rx  Name   Route  Sig  Dispense  Refill  . Aspirin-Salicylamide-Caffeine (BC HEADACHE POWDER PO)   Oral   Take 1 packet by mouth daily as needed (pain).         . diazepam (VALIUM) 5 MG tablet   Oral   Take 1 tablet (5 mg total) by mouth every 8 (eight) hours as needed for anxiety.   20 tablet   0   . glimepiride (AMARYL) 1 MG tablet   Oral   Take 1 mg by mouth daily before breakfast.         . HYDROcodone-acetaminophen (NORCO/VICODIN) 5-325 MG per tablet   Oral   Take 1 tablet by mouth every 4 (four) hours as needed for pain.   15 tablet   0   . lisinopril (PRINIVIL,ZESTRIL) 10 MG tablet   Oral   Take 10 mg by mouth daily.         . nabumetone (RELAFEN) 500 MG tablet   Oral   Take 500 mg by mouth 2 (two) times daily.         . naproxen sodium (ANAPROX) 220 MG tablet   Oral   Take 440 mg by mouth daily as needed (pain).         Marland Kitchen oxymetazoline (AFRIN) 0.05 % nasal spray   Nasal   Place 1 spray into the nose 3 (three) times daily.          . polyethylene glycol (MIRALAX / GLYCOLAX) packet   Oral  Take 17 g by mouth daily.         Marland Kitchen oxyCODONE-acetaminophen (PERCOCET) 5-325 MG per tablet   Oral   Take 1-2 tablets by mouth every 6 (six) hours as needed for severe pain.   15 tablet   0   . polyethylene glycol powder (MIRALAX) powder   Oral   Take 17 g by mouth daily.   255 g   0   . predniSONE (DELTASONE) 20 MG tablet      3 tabs po day one, then 2 po daily x 4 days   11 tablet   0    BP 172/96  Pulse 80  Temp(Src) 98.9 F (37.2 C) (Oral)  SpO2 100% Physical Exam  Nursing note and vitals reviewed. Constitutional: He is oriented to person, place, and time. He appears well-developed and well-nourished. No distress.  HENT:  Head: Normocephalic and atraumatic.  Mouth/Throat: Oropharynx is clear and moist.  Eyes: Conjunctivae are normal.  Neck: Normal range of motion. Neck supple.  Cardiovascular: Normal rate, regular rhythm and normal heart sounds.    Pulmonary/Chest: Effort normal and breath sounds normal.  Abdominal: Soft. Bowel sounds are normal. There is no tenderness.  Musculoskeletal: Normal range of motion. He exhibits tenderness. He exhibits no edema.  Generalized tenderness of low back, bilateral shoulders and hips. Full ROM. Pain exacerbated by standing. Positive SLR on left. No deformities.  Neurological: He is alert and oriented to person, place, and time. He has normal strength. No sensory deficit.  Skin: Skin is warm and dry. He is not diaphoretic.  Psychiatric: His behavior is normal. He exhibits a depressed mood.  Tearful.    ED Course  Procedures (including critical care time) Labs Review Labs Reviewed - No data to display Imaging Review No results found.  EKG Interpretation   None       MDM   1. Chronic pain   2. Spinal stenosis   3. Sciatica    Patient with chronic pain presenting with generalized body pain, worsening back pain. He is depressed, however denies suicidal or homicidal ideations. He appears frustrated that he lost his insurance and is unable to see the neurosurgeon. He has a followup appointment with his primary care physician on January 21. The Vicodin he has been prescribed is not helping him much. No red flags concerning patient's back pain. No s/s of central cord compression or cauda equina. Lower extremities are neurovascularly intact and patient ambulated on his own into the ED. I will place him on a prednisone taper, prescribe Percocet. Advised him to followup with his PCP. Stable for discharge. Return precautions given. Patient states understanding of treatment care plan and is agreeable.      Illene Labrador, PA-C 02/17/13 [phone]

## 2013-02-17 NOTE — ED Notes (Signed)
Vital signs stable. 

## 2013-02-17 NOTE — ED Provider Notes (Signed)
Medical screening examination/treatment/procedure(s) were performed by non-physician practitioner and as supervising physician I was immediately available for consultation/collaboration.  EKG Interpretation   None        Merryl Hacker, MD 02/17/13 [phone]

## 2013-02-17 NOTE — ED Notes (Signed)
[name] Miss, PA student at bedside

## 2013-02-24 ENCOUNTER — Encounter: Payer: Self-pay | Admitting: Internal Medicine

## 2013-02-24 ENCOUNTER — Ambulatory Visit: Payer: No Typology Code available for payment source | Attending: Internal Medicine | Admitting: Internal Medicine

## 2013-02-24 VITALS — BP 159/91 | HR 78 | Temp 98.0°F | Resp 15 | Wt 265.4 lb

## 2013-02-24 DIAGNOSIS — I10 Essential (primary) hypertension: Secondary | ICD-10-CM

## 2013-02-24 DIAGNOSIS — Z139 Encounter for screening, unspecified: Secondary | ICD-10-CM

## 2013-02-24 DIAGNOSIS — R202 Paresthesia of skin: Secondary | ICD-10-CM

## 2013-02-24 DIAGNOSIS — E119 Type 2 diabetes mellitus without complications: Secondary | ICD-10-CM

## 2013-02-24 DIAGNOSIS — M545 Low back pain, unspecified: Secondary | ICD-10-CM

## 2013-02-24 DIAGNOSIS — K029 Dental caries, unspecified: Secondary | ICD-10-CM

## 2013-02-24 DIAGNOSIS — R200 Anesthesia of skin: Secondary | ICD-10-CM

## 2013-02-24 DIAGNOSIS — M48061 Spinal stenosis, lumbar region without neurogenic claudication: Secondary | ICD-10-CM

## 2013-02-24 DIAGNOSIS — R209 Unspecified disturbances of skin sensation: Secondary | ICD-10-CM

## 2013-02-24 LAB — CBC WITH DIFFERENTIAL/PLATELET
BASOS PCT: 1 % (ref 0–1)
Basophils Absolute: 0 10*3/uL (ref 0.0–0.1)
Eosinophils Absolute: 0.3 10*3/uL (ref 0.0–0.7)
Eosinophils Relative: 5 % (ref 0–5)
HCT: 40.7 % (ref 39.0–52.0)
Hemoglobin: 13.4 g/dL (ref 13.0–17.0)
Lymphocytes Relative: 44 % (ref 12–46)
Lymphs Abs: 2.2 10*3/uL (ref 0.7–4.0)
MCH: 27.9 pg (ref 26.0–34.0)
MCHC: 32.9 g/dL (ref 30.0–36.0)
MCV: 84.8 fL (ref 78.0–100.0)
MONOS PCT: 8 % (ref 3–12)
Monocytes Absolute: 0.4 10*3/uL (ref 0.1–1.0)
NEUTROS PCT: 42 % — AB (ref 43–77)
Neutro Abs: 2.1 10*3/uL (ref 1.7–7.7)
PLATELETS: 316 10*3/uL (ref 150–400)
RBC: 4.8 MIL/uL (ref 4.22–5.81)
RDW: 13.1 % (ref 11.5–15.5)
WBC: 5 10*3/uL (ref 4.0–10.5)

## 2013-02-24 LAB — COMPLETE METABOLIC PANEL WITH GFR
ALT: 23 U/L (ref 0–53)
AST: 19 U/L (ref 0–37)
Albumin: 4 g/dL (ref 3.5–5.2)
Alkaline Phosphatase: 64 U/L (ref 39–117)
BILIRUBIN TOTAL: 0.4 mg/dL (ref 0.3–1.2)
BUN: 13 mg/dL (ref 6–23)
CO2: 25 meq/L (ref 19–32)
Calcium: 9.2 mg/dL (ref 8.4–10.5)
Chloride: 100 mEq/L (ref 96–112)
Creat: 0.94 mg/dL (ref 0.50–1.35)
GLUCOSE: 154 mg/dL — AB (ref 70–99)
Potassium: 4 mEq/L (ref 3.5–5.3)
SODIUM: 135 meq/L (ref 135–145)
TOTAL PROTEIN: 7.3 g/dL (ref 6.0–8.3)

## 2013-02-24 LAB — LIPID PANEL
CHOLESTEROL: 199 mg/dL (ref 0–200)
HDL: 45 mg/dL (ref >39)
LDL CALC: 117 mg/dL — AB (ref 0–99)
TRIGLYCERIDES: 186 mg/dL — AB (ref <150)
Total CHOL/HDL Ratio: 4.4 Ratio
VLDL: 37 mg/dL (ref 0–40)

## 2013-02-24 LAB — GLUCOSE, POCT (MANUAL RESULT ENTRY): POC GLUCOSE: 189 mg/dL — AB (ref 70–99)

## 2013-02-24 LAB — POCT GLYCOSYLATED HEMOGLOBIN (HGB A1C): Hemoglobin A1C: 7.6

## 2013-02-24 MED ORDER — METFORMIN HCL 500 MG PO TABS: 500.0000 mg | ORAL_TABLET | Freq: Two times a day (BID) | ORAL | Status: DC

## 2013-02-24 MED ORDER — LISINOPRIL 10 MG PO TABS: 10.0000 mg | ORAL_TABLET | Freq: Every day | ORAL | Status: DC

## 2013-02-24 MED ORDER — NABUMETONE 500 MG PO TABS: 500.0000 mg | ORAL_TABLET | Freq: Two times a day (BID) | ORAL | Status: DC

## 2013-02-24 NOTE — Progress Notes (Signed)
Patient here to establish care Has history of DM Spinal stenosis Carpal tunnel syndrome Chronic pain

## 2013-02-24 NOTE — Patient Instructions (Signed)
Diabetes Meal Planning Guide The diabetes meal planning guide is a tool to help you plan your meals and snacks. It is important for people with diabetes to manage their blood glucose (sugar) levels. Choosing the right foods and the right amounts throughout your day will help control your blood glucose. Eating right can even help you improve your blood pressure and reach or maintain a healthy weight. CARBOHYDRATE COUNTING MADE EASY When you eat carbohydrates, they turn to sugar. This raises your blood glucose level. Counting carbohydrates can help you control this level so you feel better. When you plan your meals by counting carbohydrates, you can have more flexibility in what you eat and balance your medicine with your food intake. Carbohydrate counting simply means adding up the total amount of carbohydrate grams in your meals and snacks. Try to eat about the same amount at each meal. Foods with carbohydrates are listed below. Each portion below is 1 carbohydrate serving or 15 grams of carbohydrates. Ask your dietician how many grams of carbohydrates you should eat at each meal or snack. Grains and Starches  1 slice bread.   English muffin or hotdog/hamburger bun.   cup cold cereal (unsweetened).   cup cooked pasta or rice.   cup starchy vegetables (corn, potatoes, peas, beans, winter squash).  1 tortilla (6 inches).   bagel.  1 waffle or pancake (size of a CD).   cup cooked cereal.  4 to 6 small crackers. *Whole grain is recommended. Fruit  1 cup fresh unsweetened berries, melon, papaya, pineapple.  1 small fresh fruit.   banana or mango.   cup fruit juice (4 oz unsweetened).   cup canned fruit in natural juice or water.  2 tbs dried fruit.  12 to 15 grapes or cherries. Milk and Yogurt  1 cup fat-free or 1% milk.  1 cup soy milk.  6 oz light yogurt with sugar-free sweetener.  6 oz low-fat soy yogurt.  6 oz plain yogurt. Vegetables  1 cup raw or  cup  cooked is counted as 0 carbohydrates or a "free" food.  If you eat 3 or more servings at 1 meal, count them as 1 carbohydrate serving. Other Carbohydrates   oz chips or pretzels.   cup ice cream or frozen yogurt.   cup sherbet or sorbet.  2 inch square cake, no frosting.  1 tbs honey, sugar, jam, jelly, or syrup.  2 small cookies.  3 squares of graham crackers.  3 cups popcorn.  6 crackers.  1 cup broth-based soup.  Count 1 cup casserole or other mixed foods as 2 carbohydrate servings.  Foods with less than 20 calories in a serving may be counted as 0 carbohydrates or a "free" food. You may want to purchase a book or computer software that lists the carbohydrate gram counts of different foods. In addition, the nutrition facts panel on the labels of the foods you eat are a good source of this information. The label will tell you how big the serving size is and the total number of carbohydrate grams you will be eating per serving. Divide this number by 15 to obtain the number of carbohydrate servings in a portion. Remember, 1 carbohydrate serving equals 15 grams of carbohydrate. SERVING SIZES Measuring foods and serving sizes helps you make sure you are getting the right amount of food. The list below tells how big or small some common serving sizes are.  1 oz.........4 stacked dice.  3 oz.........Deck of cards.  1 tsp........Tip   of little finger.  1 tbs......Marland KitchenMarland KitchenThumb.  2 tbs.......Marland KitchenGolf ball.   cup......Marland KitchenHalf of a fist.  1 cup.......Marland KitchenA fist. SAMPLE DIABETES MEAL PLAN Below is a sample meal plan that includes foods from the grain and starches, dairy, vegetable, fruit, and meat groups. A dietician can individualize a meal plan to fit your calorie needs and tell you the number of servings needed from each food group. However, controlling the total amount of carbohydrates in your meal or snack is more important than making sure you include all of the food groups at every  meal. You may interchange carbohydrate containing foods (dairy, starches, and fruits). The meal plan below is an example of a 2000 calorie diet using carbohydrate counting. This meal plan has 17 carbohydrate servings. Breakfast  1 cup oatmeal (2 carb servings).   cup light yogurt (1 carb serving).  1 cup blueberries (1 carb serving).   cup almonds. Snack  1 large apple (2 carb servings).  1 low-fat string cheese stick. Lunch  Chicken breast salad.  1 cup spinach.   cup chopped tomatoes.  2 oz chicken breast, sliced.  2 tbs low-fat [place] dressing.  12 whole-wheat crackers (2 carb servings).  12 to 15 grapes (1 carb serving).  1 cup low-fat milk (1 carb serving). Snack  1 cup carrots.   cup hummus (1 carb serving). Dinner  3 oz broiled salmon.  1 cup brown rice (3 carb servings). Snack  1  cups steamed broccoli (1 carb serving) drizzled with 1 tsp olive oil and lemon juice.  1 cup light pudding (2 carb servings). DIABETES MEAL PLANNING WORKSHEET Your dietician can use this worksheet to help you decide how many servings of foods and what types of foods are right for you.  BREAKFAST Food Group and Servings / Carb Servings Grain/Starches __________________________________ Dairy __________________________________________ Vegetable ______________________________________ Fruit ___________________________________________ Meat __________________________________________ Fat ____________________________________________ LUNCH Food Group and Servings / Carb Servings Grain/Starches ___________________________________ Dairy ___________________________________________ Fruit ____________________________________________ Meat ___________________________________________ Fat _____________________________________________ [name] Food Group and Servings / Carb Servings Grain/Starches ___________________________________ Dairy  ___________________________________________ Fruit ____________________________________________ Meat ___________________________________________ Fat _____________________________________________ SNACKS Food Group and Servings / Carb Servings Grain/Starches ___________________________________ Dairy ___________________________________________ Vegetable _______________________________________ Fruit ____________________________________________ Meat ___________________________________________ Fat _____________________________________________ DAILY TOTALS Starches _________________________ Vegetable ________________________ Fruit ____________________________ Dairy ____________________________ Meat ____________________________ Fat ______________________________ Document Released: 10/18/2004 Document Revised: 04/15/2011 Document Reviewed: 08/29/2008 [employer] Patient Information 2014 [employer]. 2 Gram Low Sodium Diet A 2 gram sodium diet restricts the amount of sodium in the diet to no more than 2 g or 2000 mg daily. Limiting the amount of sodium is often used to help lower blood pressure. It is important if you have heart, liver, or kidney problems. Many foods contain sodium for flavor and sometimes as a preservative. When the amount of sodium in a diet needs to be low, it is important to know what to look for when choosing foods and drinks. The following includes some information and guidelines to help make it easier for you to adapt to a low sodium diet. QUICK TIPS  Do not add salt to food.  Avoid convenience items and fast food.  Choose unsalted snack foods.  Buy lower sodium products, often labeled as "lower sodium" or "no salt added."  Check food labels to learn how much sodium is in 1 serving.  When eating at a restaurant, ask that your food be prepared with less salt or none, if possible. READING FOOD LABELS FOR SODIUM INFORMATION The nutrition facts label is a good place to  find how much sodium is in foods. Look for products with no more than 500 to 600 mg of sodium per meal and no more than 150 mg per serving. Remember that 2 g = 2000 mg. The food label may also list foods as:  Sodium-free: Less than 5 mg in a serving.  Very low sodium: 35 mg or less in a serving.  Low-sodium: 140 mg or less in a serving.  Light in sodium: 50% less sodium in a serving. For example, if a food that usually has 300 mg of sodium is changed to become light in sodium, it will have 150 mg of sodium.  Reduced sodium: 25% less sodium in a serving. For example, if a food that usually has 400 mg of sodium is changed to reduced sodium, it will have 300 mg of sodium. CHOOSING FOODS Grains  Avoid: Salted crackers and snack items. Some cereals, including instant hot cereals. Bread stuffing and biscuit mixes. Seasoned rice or pasta mixes.  Choose: Unsalted snack items. Low-sodium cereals, oats, puffed wheat and rice, shredded wheat. English muffins and bread. Pasta. Meats  Avoid: Salted, canned, smoked, spiced, pickled meats, including fish and poultry. Bacon, ham, sausage, cold cuts, hot dogs, anchovies.  Choose: Low-sodium canned tuna and salmon. Fresh or frozen meat, poultry, and fish. Dairy  Avoid: Processed cheese and spreads. Cottage cheese. Buttermilk and condensed milk. Regular cheese.  Choose: Milk. Low-sodium cottage cheese. Yogurt. Sour cream. Low-sodium cheese. Fruits and Vegetables  Avoid: Regular canned vegetables. Regular canned tomato sauce and paste. Frozen vegetables in sauces. Olives. Pickles. Relishes. Sauerkraut.  Choose: Low-sodium canned vegetables. Low-sodium tomato sauce and paste. Frozen or fresh vegetables. Fresh and frozen fruit. Condiments  Avoid: Canned and packaged gravies. Worcestershire sauce. Tartar sauce. Barbecue sauce. Soy sauce. Steak sauce. Ketchup. Onion, garlic, and table salt. Meat flavorings and tenderizers.  Choose: Fresh and dried  herbs and spices. Low-sodium varieties of mustard and ketchup. Lemon juice. Tabasco sauce. Horseradish. SAMPLE 2 GRAM SODIUM MEAL PLAN Breakfast / Sodium (mg)  1 cup low-fat milk / 143 mg  2 slices whole-wheat toast / 270 mg  1 tbs heart-healthy margarine / 153 mg  1 hard-boiled egg / 139 mg  1 small orange / 0 mg Lunch / Sodium (mg)  1 cup raw carrots / 76 mg   cup hummus / 298 mg  1 cup low-fat milk / 143 mg   cup red grapes / 2 mg  1 whole-wheat pita bread / 356 mg Dinner / Sodium (mg)  1 cup whole-wheat pasta / 2 mg  1 cup low-sodium tomato sauce / 73 mg  3 oz lean ground beef / 57 mg  1 small side salad (1 cup raw spinach leaves,  cup cucumber,  cup yellow bell pepper) with 1 tsp olive oil and 1 tsp red wine vinegar / 25 mg Snack / Sodium (mg)  1 container low-fat vanilla yogurt / 107 mg  3 graham cracker squares / 127 mg Nutrient Analysis  Calories: 2033  Protein: 77 g  Carbohydrate: 282 g  Fat: 72 g  Sodium: 1971 mg Document Released: 01/21/2005 Document Revised: 04/15/2011 Document Reviewed: 04/24/2009 [employer] Patient Information 2014 [employer].  

## 2013-02-24 NOTE — Progress Notes (Signed)
Patient Demographics  [name], is a 53 y.o. male  QHU:[id number]  WSF:[id number]  DOB - [birth year]  CC:  Chief Complaint  Patient presents with  . Diabetes  . Spinal Stenosis       HPI: [name] is a 53 y.o. male here today to establish medical care. He has history of diabetes for several years as per patient initially was taking metformin then he was switched to [facility] as per patient it was thought that metformin was causing her lower extremities pain, but recently he has been diagnosed with spinal stenosis, has been having lot of lower back pain, as per patient he had a scheduled appointment with neurosurgery last month but she lost his insurance currently has orange card, also has history of numbness tingling in both hands, patient was supposed to see a neurology for possible no conduction study testing done to rule out carpal tunnel syndrome, patient is requesting refill on his diabetes medication, also hemoglobin A1c today is 7.6%, patient wants to resume back on metformin. Patient is also requesting to see a dentist has lot of dental caries. Patient has No headache, No chest pain, No abdominal pain - No Nausea,  No Cough - SOB.  No Known Allergies Past Medical History  Diagnosis Date  . Diabetes mellitus without complication   . Hypertension   . Spinal stenosis   . Carpal tunnel syndrome   . Sciatica    Current Outpatient Prescriptions on File Prior to Visit  Medication Sig Dispense Refill  . diazepam (VALIUM) 5 MG tablet Take 1 tablet (5 mg total) by mouth every 8 (eight) hours as needed for anxiety.  20 tablet  0  . HYDROcodone-acetaminophen (NORCO/VICODIN) 5-325 MG per tablet Take 1 tablet by mouth every 4 (four) hours as needed for pain.  15 tablet  0  . naproxen sodium (ANAPROX) 220 MG tablet Take 440 mg by mouth daily as needed (pain).      . polyethylene glycol powder (MIRALAX) powder Take 17 g by mouth daily.  255 g  0  . predniSONE (DELTASONE) 20 MG tablet 3  tabs po day one, then 2 po daily x 4 days  11 tablet  0  . Aspirin-Salicylamide-Caffeine (BC HEADACHE POWDER PO) Take 1 packet by mouth daily as needed (pain).      Marland Kitchen oxyCODONE-acetaminophen (PERCOCET) 5-325 MG per tablet Take 1-2 tablets by mouth every 6 (six) hours as needed for severe pain.  15 tablet  0  . oxymetazoline (AFRIN) 0.05 % nasal spray Place 1 spray into the nose 3 (three) times daily.       . polyethylene glycol (MIRALAX / GLYCOLAX) packet Take 17 g by mouth daily.       No current facility-administered medications on file prior to visit.   Family History  Problem Relation Age of Onset  . Diabetes Mother   . Arthritis Mother   . Heart disease Mother   . Diabetes Sister   . Cancer Sister   . Arthritis Maternal Grandmother    History   Social History  . Marital Status: Married    Spouse Name: N/A    Number of Children: N/A  . Years of Education: N/A   Occupational History  . Not on file.   Social History Main Topics  . Smoking status: Former Smoker -- 30 years  . Smokeless tobacco: Not on file  . Alcohol Use: Yes     Comment: social  . Drug Use: Yes    Special:  Marijuana  . Sexual Activity: Not on file   Other Topics Concern  . Not on file   Social History Narrative  . No narrative on file    Review of Systems: Constitutional: Negative for fever, chills, diaphoresis, activity change, appetite change and fatigue. HENT: Negative for ear pain, nosebleeds, congestion, facial swelling, rhinorrhea, neck pain, neck stiffness and ear discharge. Dental caries. Eyes: Negative for pain, discharge, redness, itching and visual disturbance. Respiratory: Negative for cough, choking, chest tightness, shortness of breath, wheezing and stridor.  Cardiovascular: Negative for chest pain, palpitations and leg swelling. Gastrointestinal: Negative for abdominal distention. Genitourinary: Negative for dysuria, urgency, frequency, hematuria, flank pain, decreased urine volume,  difficulty urinating and dyspareunia.  Musculoskeletal: Positive for back pain, joint swelling, +arthralgia and gait problem. Neurological: Negative for dizziness, tremors, seizures, syncope, facial asymmetry, speech difficulty, weakness, light-headedness, +numbness and headaches.  Hematological: Negative for adenopathy. Does not bruise/bleed easily. Psychiatric/Behavioral: Negative for hallucinations, behavioral problems, confusion, dysphoric mood, decreased concentration and agitation.    Objective:   Filed Vitals:   02/24/13 1148  BP: 159/91  Pulse: 78  Temp: 98 F (36.7 C)  Resp: 15    Physical Exam: Constitutional: Patient appears well-developed and well-nourished. No distress. HENT: Normocephalic, atraumatic, External right and left ear normal. Oropharynx is clear and moist.  Eyes: Conjunctivae and EOM are normal. PERRLA, no scleral icterus. Neck: Normal ROM. Neck supple. No JVD. No tracheal deviation. No thyromegaly. CVS: RRR, S1/S2 +, no murmurs, no gallops, no carotid bruit.  Pulmonary: Effort and breath sounds normal, no stridor, rhonchi, wheezes, rales.  Abdominal: Soft. BS +, no distension, tenderness, rebound or guarding.  Musculoskeletal: Lower spinal and paraspinal tenderness, patient could not perform SLR test because of severe pain  Neuro: Alert. Normal reflexes, muscle tone coordination. No cranial nerve deficit. Skin: Skin is warm and dry. No rash noted. Not diaphoretic. No erythema. No pallor. Psychiatric: Normal mood and affect. Behavior, judgment, thought content normal.  Lab Results  Component Value Date   WBC 4.4 01/17/2013   HGB 13.6 01/17/2013   HCT 40.5 01/17/2013   MCV 84.6 01/17/2013   PLT 261 01/17/2013   Lab Results  Component Value Date   CREATININE 1.01 01/17/2013   BUN 14 01/17/2013   NA 132* 01/17/2013   K 4.0 01/17/2013   CL 99 01/17/2013   CO2 24 01/17/2013    Lab Results  Component Value Date   HGBA1C 7.6% 02/24/2013   Lipid  Panel  No results found for this basename: chol, trig, hdl, cholhdl, vldl, ldlcalc       Assessment and plan:   1. DM (diabetes mellitus)  - Glucose (CBG) - HgB A1c 7.6% Resume back on metformin 500 mg 2 times a day - metFORMIN (GLUCOPHAGE) 500 MG tablet; Take 1 tablet (500 mg total) by mouth 2 (two) times daily with a meal.  Dispense: 180 tablet; Refill: 3 Advised patient to keep the fingerstick log.  2. Spinal stenosis at L4-L5 level  - Ambulatory referral to Neurosurgery  3. Lower back pain  - Ambulatory referral to Neurosurgery - nabumetone (RELAFEN) 500 MG tablet; Take 1 tablet (500 mg total) by mouth 2 (two) times daily.  Dispense: 60 tablet; Refill: 2  4. Essential hypertension, benign  - lisinopril (PRINIVIL,ZESTRIL) 10 MG tablet; Take 1 tablet (10 mg total) by mouth daily.  Dispense: 30 tablet; Refill: 4 Advised patient follow salt diet.  5. Dental caries  - Ambulatory referral to Dentistry  6. Numbness and tingling  in hands  - Ambulatory referral to Neurology  7. Screening  - CBC with Differential - COMPLETE METABOLIC PANEL WITH GFR - TSH - Lipid panel - Vit D  25 hydroxy (rtn osteoporosis monitoring)  Return in about 6 weeks (around 04/07/2013).     Lorayne Marek, MD

## 2013-02-25 LAB — TSH: TSH: 2.688 u[IU]/mL (ref 0.350–4.500)

## 2013-02-25 LAB — VITAMIN D 25 HYDROXY (VIT D DEFICIENCY, FRACTURES): Vit D, 25-Hydroxy: 21 ng/mL — ABNORMAL LOW (ref 30–89)

## 2013-02-26 ENCOUNTER — Telehealth: Payer: Self-pay

## 2013-02-26 MED ORDER — VITAMIN D (ERGOCALCIFEROL) 1.25 MG (50000 UNIT) PO CAPS: 50000.0000 [IU] | ORAL_CAPSULE | ORAL | Status: DC

## 2013-02-26 NOTE — Telephone Encounter (Signed)
Patient returned phone call Aware of his lab results [facility]

## 2013-02-26 NOTE — Telephone Encounter (Signed)
Message copied by Dorothe Pea on Fri Feb 26, 2013 10:00 AM ------      Message from: Lorayne Marek      Created: Fri Feb 26, 2013  9:24 AM       Blood work reviewed, noticed low vitamin D, call patient advise to start ergocalciferol 50,000 units once a week for the duration of  12 weeks.       noticed elevated cholesterol, advise patient for low fat diet.       ------

## 2013-02-26 NOTE — Telephone Encounter (Signed)
Patient not available Left message to return our call 

## 2013-03-22 ENCOUNTER — Encounter: Payer: Self-pay | Admitting: Internal Medicine

## 2013-03-24 ENCOUNTER — Telehealth: Payer: Self-pay

## 2013-03-24 NOTE — Telephone Encounter (Signed)
Can we make this patient and appt asap

## 2013-03-26 ENCOUNTER — Encounter: Payer: Self-pay | Admitting: *Deleted

## 2013-03-31 ENCOUNTER — Ambulatory Visit: Payer: No Typology Code available for payment source | Admitting: Neurology

## 2013-04-07 ENCOUNTER — Ambulatory Visit: Payer: No Typology Code available for payment source | Attending: Internal Medicine | Admitting: Internal Medicine

## 2013-04-07 ENCOUNTER — Encounter: Payer: Self-pay | Admitting: Internal Medicine

## 2013-04-07 VITALS — BP 130/70 | HR 72 | Temp 98.0°F | Resp 16 | Wt 260.6 lb

## 2013-04-07 DIAGNOSIS — F3289 Other specified depressive episodes: Secondary | ICD-10-CM

## 2013-04-07 DIAGNOSIS — E559 Vitamin D deficiency, unspecified: Secondary | ICD-10-CM | POA: Insufficient documentation

## 2013-04-07 DIAGNOSIS — E119 Type 2 diabetes mellitus without complications: Secondary | ICD-10-CM | POA: Insufficient documentation

## 2013-04-07 DIAGNOSIS — F329 Major depressive disorder, single episode, unspecified: Secondary | ICD-10-CM | POA: Insufficient documentation

## 2013-04-07 DIAGNOSIS — R52 Pain, unspecified: Secondary | ICD-10-CM

## 2013-04-07 DIAGNOSIS — R209 Unspecified disturbances of skin sensation: Secondary | ICD-10-CM

## 2013-04-07 DIAGNOSIS — R200 Anesthesia of skin: Secondary | ICD-10-CM

## 2013-04-07 DIAGNOSIS — I10 Essential (primary) hypertension: Secondary | ICD-10-CM | POA: Insufficient documentation

## 2013-04-07 DIAGNOSIS — M48061 Spinal stenosis, lumbar region without neurogenic claudication: Secondary | ICD-10-CM | POA: Insufficient documentation

## 2013-04-07 DIAGNOSIS — Z09 Encounter for follow-up examination after completed treatment for conditions other than malignant neoplasm: Secondary | ICD-10-CM | POA: Insufficient documentation

## 2013-04-07 DIAGNOSIS — F32A Depression, unspecified: Secondary | ICD-10-CM | POA: Insufficient documentation

## 2013-04-07 DIAGNOSIS — R202 Paresthesia of skin: Secondary | ICD-10-CM

## 2013-04-07 MED ORDER — DULOXETINE HCL 20 MG PO CPEP: 40.0000 mg | ORAL_CAPSULE | Freq: Every day | ORAL | Status: DC

## 2013-04-07 NOTE — Progress Notes (Signed)
Patient here for follow up Complains of still having all sorts of pain Wakes up with hands swollen Pain between shoulder blades Having a hard time sleeping Feels depressed at times Feels like no one is taking care of his issues Can afford the out of pocket expense for specialist

## 2013-04-07 NOTE — Patient Instructions (Signed)

## 2013-04-07 NOTE — Progress Notes (Signed)
MRN: [id number] Name: [name]  Sex: male Age: 53 y.o. DOB: 1960/02/12  Allergies: Review of patient's allergies indicates no known allergies.  Chief Complaint  Patient presents with  . Follow-up    HPI: Patient is 53 y.o. male who has to of diabetes hypertension, lower back pain numbness tingling in the hands, patient has already been referred to neurosurgery, as per patient he was supposed to get MRI done, recently patient has been feeling depressed and has lot of lower back pain, patient denies any SI or HI, patient took his blood pressure medication today is repeat manual blood pressure is 130/70 denies any headache or dizziness, previous blood work reviewed with the patient on to have vitamin D deficiency , he is already started taking supplement.  Past Medical History  Diagnosis Date  . Diabetes mellitus without complication   . Hypertension   . Spinal stenosis   . Carpal tunnel syndrome   . Sciatica     Past Surgical History  Procedure Laterality Date  . Appendectomy        Medication List       This list is accurate as of: 04/07/13 12:24 PM.  Always use your most recent med list.               BC HEADACHE POWDER PO  Take 1 packet by mouth daily as needed (pain).     diazepam 5 MG tablet  Commonly known as:  VALIUM  Take 1 tablet (5 mg total) by mouth every 8 (eight) hours as needed for anxiety.     DULoxetine 20 MG capsule  Commonly known as:  CYMBALTA  Take 2 capsules (40 mg total) by mouth daily.     HYDROcodone-acetaminophen 5-325 MG per tablet  Commonly known as:  NORCO/VICODIN  Take 1 tablet by mouth every 4 (four) hours as needed for pain.     lisinopril 10 MG tablet  Commonly known as:  PRINIVIL,ZESTRIL  Take 1 tablet (10 mg total) by mouth daily.     metFORMIN 500 MG tablet  Commonly known as:  GLUCOPHAGE  Take 1 tablet (500 mg total) by mouth 2 (two) times daily with a meal.     nabumetone 500 MG tablet  Commonly known as:   RELAFEN  Take 1 tablet (500 mg total) by mouth 2 (two) times daily.     naproxen sodium 220 MG tablet  Commonly known as:  ANAPROX  Take 440 mg by mouth daily as needed (pain).     oxyCODONE-acetaminophen 5-325 MG per tablet  Commonly known as:  PERCOCET  Take 1-2 tablets by mouth every 6 (six) hours as needed for severe pain.     oxymetazoline 0.05 % nasal spray  Commonly known as:  AFRIN  Place 1 spray into the nose 3 (three) times daily.     polyethylene glycol powder powder  Commonly known as:  MIRALAX  Take 17 g by mouth daily.     polyethylene glycol packet  Commonly known as:  MIRALAX / GLYCOLAX  Take 17 g by mouth daily.     predniSONE 20 MG tablet  Commonly known as:  DELTASONE  3 tabs po day one, then 2 po daily x 4 days     Vitamin D (Ergocalciferol) 50000 UNITS Caps capsule  Commonly known as:  DRISDOL  Take 1 capsule (50,000 Units total) by mouth every 7 (seven) days.        Meds ordered this encounter  Medications  .  DULoxetine (CYMBALTA) 20 MG capsule    Sig: Take 2 capsules (40 mg total) by mouth daily.    Dispense:  30 capsule    Refill:  3     There is no immunization history on file for this patient.  Family History  Problem Relation Age of Onset  . Diabetes Mother   . Arthritis Mother   . Heart disease Mother   . Diabetes Sister   . Cancer Sister   . Arthritis Maternal Grandmother     History  Substance Use Topics  . Smoking status: Former Smoker -- 30 years  . Smokeless tobacco: Not on file  . Alcohol Use: Yes     Comment: social    Review of Systems   As noted in HPI  Filed Vitals:   04/07/13 1148  BP: 130/70  Pulse:   Temp:   Resp:     Physical Exam  Physical Exam  Constitutional: No distress.  Eyes: EOM are normal. Pupils are equal, round, and reactive to light.  Cardiovascular: Normal rate and regular rhythm.   Pulmonary/Chest: Breath sounds normal. No respiratory distress. He has no wheezes. He has no rales.    Musculoskeletal:  Lower spinal and paraspinal tenderness     CBC    Component Value Date/Time   WBC 5.0 02/24/2013 1233   RBC 4.80 02/24/2013 1233   HGB 13.4 02/24/2013 1233   HCT 40.7 02/24/2013 1233   PLT 316 02/24/2013 1233   MCV 84.8 02/24/2013 1233   LYMPHSABS 2.2 02/24/2013 1233   MONOABS 0.4 02/24/2013 1233   EOSABS 0.3 02/24/2013 1233   BASOSABS 0.0 02/24/2013 1233    CMP     Component Value Date/Time   NA 135 02/24/2013 1233   K 4.0 02/24/2013 1233   CL 100 02/24/2013 1233   CO2 25 02/24/2013 1233   GLUCOSE 154* 02/24/2013 1233   BUN 13 02/24/2013 1233   CREATININE 0.94 02/24/2013 1233   CREATININE 1.01 01/17/2013 0915   CALCIUM 9.2 02/24/2013 1233   PROT 7.3 02/24/2013 1233   ALBUMIN 4.0 02/24/2013 1233   AST 19 02/24/2013 1233   ALT 23 02/24/2013 1233   ALKPHOS 64 02/24/2013 1233   BILITOT 0.4 02/24/2013 1233   GFRNONAA 85* 01/17/2013 0915   GFRAA >90 01/17/2013 0915    Lab Results  Component Value Date/Time   CHOL 199 02/24/2013 12:33 PM    No components found with this basename: hga1c    Lab Results  Component Value Date/Time   AST 19 02/24/2013 12:33 PM    Assessment and Plan  Unspecified vitamin D deficiency Continue with vitamin D supplement.  Essential hypertension, benign Well-controlled continued lisinopril.   DM (diabetes mellitus) On metformin will repeat hemoglobin A1c in 3 months.  Spinal stenosis at L4-L5 level/Numbness and tingling in hands  Follow with neurosurgery.  Depression/Pain - Plan: Patient will try DULoxetine (CYMBALTA) 20 MG capsule  Return in about 3 months (around 07/08/2013).  Lorayne Marek, MD

## 2013-04-08 ENCOUNTER — Other Ambulatory Visit: Payer: Self-pay | Admitting: Internal Medicine

## 2013-04-15 ENCOUNTER — Ambulatory Visit: Payer: No Typology Code available for payment source | Attending: Internal Medicine

## 2013-04-16 ENCOUNTER — Other Ambulatory Visit (HOSPITAL_COMMUNITY): Payer: Self-pay | Admitting: Neurosurgery

## 2013-04-16 DIAGNOSIS — M5412 Radiculopathy, cervical region: Secondary | ICD-10-CM

## 2013-04-19 ENCOUNTER — Telehealth: Payer: Self-pay | Admitting: Internal Medicine

## 2013-04-19 ENCOUNTER — Other Ambulatory Visit: Payer: Self-pay

## 2013-04-19 DIAGNOSIS — F32A Depression, unspecified: Secondary | ICD-10-CM

## 2013-04-19 DIAGNOSIS — F329 Major depressive disorder, single episode, unspecified: Secondary | ICD-10-CM

## 2013-04-19 DIAGNOSIS — R52 Pain, unspecified: Secondary | ICD-10-CM

## 2013-04-19 MED ORDER — DULOXETINE HCL 20 MG PO CPEP: 40.0000 mg | ORAL_CAPSULE | Freq: Every day | ORAL | Status: DC

## 2013-04-19 NOTE — Telephone Encounter (Signed)
Pt came in today requesting a letter from Dr. Annitta Needs stating his inability to work; pt says that social services is requiring the form by today or his license will be revoked; please f/u with pt for fax information

## 2013-04-19 NOTE — Telephone Encounter (Signed)
Pt came in today to see if he can get a letter written by Dr. Annitta Needs stating his inability to work; pt says that [employer] is requiring the letter be faxed over by today or pt will have his license revoked; please f/u with pt

## 2013-04-21 NOTE — Telephone Encounter (Signed)
Spoke with patient and he stated he received a letter already and sent it to social services

## 2013-04-23 ENCOUNTER — Telehealth: Payer: Self-pay | Admitting: Internal Medicine

## 2013-04-23 NOTE — Telephone Encounter (Signed)
Pt. Needs letter for [facility] stating patient can look for work. Please call patient if this is possible.

## 2013-04-26 NOTE — Telephone Encounter (Signed)
Pt need letter stating he can look for work. Please f/u

## 2013-04-27 NOTE — Telephone Encounter (Signed)
Pt calling again regarding letter.  Would like to know when he can pick it up. Please f/u with pt.

## 2013-04-28 NOTE — Telephone Encounter (Signed)
Pt already faxed paper in

## 2013-04-30 ENCOUNTER — Ambulatory Visit (HOSPITAL_COMMUNITY)
Admission: RE | Admit: 2013-04-30 | Discharge: 2013-04-30 | Disposition: A | Discharge status: Home / Self Care | Payer: No Typology Code available for payment source | Source: Ambulatory Visit | Attending: Neurosurgery | Admitting: Neurosurgery

## 2013-04-30 DIAGNOSIS — M5412 Radiculopathy, cervical region: Secondary | ICD-10-CM | POA: Insufficient documentation

## 2013-05-04 ENCOUNTER — Telehealth: Payer: Self-pay | Admitting: Internal Medicine

## 2013-05-04 ENCOUNTER — Ambulatory Visit (INDEPENDENT_AMBULATORY_CARE_PROVIDER_SITE_OTHER): Payer: Self-pay | Admitting: Neurology

## 2013-05-04 ENCOUNTER — Encounter: Payer: Self-pay | Admitting: Neurology

## 2013-05-04 VITALS — BP 118/78 | HR 81 | Ht 71.26 in | Wt 259.0 lb

## 2013-05-04 DIAGNOSIS — M5417 Radiculopathy, lumbosacral region: Secondary | ICD-10-CM

## 2013-05-04 DIAGNOSIS — R52 Pain, unspecified: Secondary | ICD-10-CM

## 2013-05-04 DIAGNOSIS — R209 Unspecified disturbances of skin sensation: Secondary | ICD-10-CM

## 2013-05-04 DIAGNOSIS — IMO0002 Reserved for concepts with insufficient information to code with codable children: Secondary | ICD-10-CM

## 2013-05-04 DIAGNOSIS — M5412 Radiculopathy, cervical region: Secondary | ICD-10-CM

## 2013-05-04 MED ORDER — GABAPENTIN 300 MG PO CAPS: 300.0000 mg | ORAL_CAPSULE | Freq: Every day | ORAL | Status: DC

## 2013-05-04 NOTE — Telephone Encounter (Signed)
Pt has come in today requesting a letter be written to unemployment to be cleared for work; pt can be contacted at [phone] when letter is ready for pick -up

## 2013-05-04 NOTE — Patient Instructions (Addendum)
1.  EMG of the left side 2.  Start taking neurontin 300mg  at bedtime 3.  Start physical therapy and occupation therapy 4.  Return to clinic in 6-weeks   [place] (EMG/NCS) INSTRUCTIONS  How to Prepare The neurologist conducting the EMG will need to know if you have certain medical conditions. Tell the neurologist and other EMG lab personnel if you:   Have a pacemaker or any other electrical medical device   Take blood-thinning medications   Have hemophilia, a blood-clotting disorder that causes prolonged bleeding Bathing Take a shower or bath shortly before your exam in order to remove oils from your skin. Don't apply lotions or creams before the exam.  What to Expect You'll likely be asked to change into a hospital gown for the procedure and lie down on an examination table. The following explanations can help you understand what will happen during the exam.    Electrodes. The neurologist or a technician places surface electrodes at various locations on your skin depending on where you're experiencing symptoms. Or the neurologist may insert needle electrodes at different sites depending on your symptoms.    Sensations. The electrodes will at times transmit a tiny electrical current that you may feel as a twinge or spasm. The needle electrode may cause discomfort or pain that usually ends shortly after the needle is removed. If you are concerned about discomfort or pain, you may want to talk to the neurologist about taking a short break during the exam.    Instructions. During the needle EMG, the neurologist will assess whether there is any spontaneous electrical activity when the muscle is at rest - activity that isn't present in healthy muscle tissue - and the degree of activity when you slightly contract the muscle.  He or she will give you instructions on resting and contracting a muscle at appropriate times. Depending on what muscles and nerves the  neurologist is examining, he or she may ask you to change positions during the exam.  After your EMG You may experience some temporary, minor bruising where the needle electrode was inserted into your muscle. This bruising should fade within several days. If it persists, contact your primary care doctor.

## 2013-05-04 NOTE — Progress Notes (Signed)
a [facility] Clinic Note - Initial Visit   Date: 05/04/2013    [name] MRN: [id number] DOB: 12-14-1960   Dear Dr Annitta Needs:  Thank you for your kind referral of [name] for consultation of left arm pain, neck pain, and back pain. Although his history is well known to you, please allow [place] to reiterate it for the purpose of our medical record. The patient was accompanied to the clinic by self.    History of Present Illness: [name] is a 53 y.o. right-handed African American male with history of diabetes mellitus (HbA1c 7.6), hypertension, spinal stenosis, and carpal tunnel syndrome  presenting for evaluation of left arm pain, neck pain, and back pain.    1.  Low back pain started in 2014, worse with walking and prolonged sitting and improved when he leans on things.  He is unable to bend over and pick up things.  Pain is characterized as soreness involving the leg and back and occasional sharp pain in the left leg.  He has tried valium, oxycodone, and relafen.  He has tripped a few times.  Climbing stairs is particularly difficult.  2.  He also complains of neck pain which started around the same time.  He wakes up several times at night because of bilateral arm pain.  His pain is located over the shoulder blades and bilateral arms.  He has pain over the medial forearm into the wrist that started about 32-month ago.  3.  Starting in March 2014, he noticed weakness and numbness of the hand. Bilateral wrist soreness pain and numbness involving the left thumb, index, and middle finger.  He has been using a wrist splint which helps.  Fine motor movements, opening jars is more difficult for him.  He is complaining of generalized fatigue, headaches, nasal congestion, poor sleep, and night sweats.  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   Out-side paper records, electronic medical record, and images have been reviewed where available  and summarized as:  MRI lumbar spine wo contrast 12/22/2012: Congenital and acquired spinal stenosis most notable at L4-5. Small central subligamentous protrusion with moderate facet and ligamentum flavum hypertrophy at this level narrow the lateral recesses and foramina.  Similar less severe changes noted L3-L4, with short pedicles, posterior element hypertrophy, but a normal disc space.   MRI cervical spine 04/30/2013: 1. Mild bilateral foraminal stenosis at C3-4.  2. Bulging annulus and osteophytic ridging at C4-5 with mass effect on the ventral thecal sac and left greater than right foraminal stenosis.  3. Focal central disc protrusion at C5-6 with mass effect on the thecal sac and anterior cervical cord.   Lab Results  Component Value Date   HGBA1C 7.6% 02/24/2013   Lab Results  Component Value Date   TSH 2.688 02/24/2013     Past Medical History  Diagnosis Date  . Diabetes mellitus without complication   . Hypertension   . Spinal stenosis   . Carpal tunnel syndrome   . Sciatica     Past Surgical History  Procedure Laterality Date  . Appendectomy       Medications:  Current Outpatient Prescriptions on File Prior to Visit  Medication Sig Dispense Refill  . Aspirin-Salicylamide-Caffeine (BC HEADACHE POWDER PO) Take 1 packet by mouth daily as needed (pain).      . diazepam (VALIUM) 5 MG tablet Take 1 tablet (5 mg total) by mouth every 8 (eight) hours as needed for anxiety.  20 tablet  0  . DULoxetine (CYMBALTA) 20 MG capsule Take 2 capsules (40 mg total) by mouth daily.  240 capsule  2  . HYDROcodone-acetaminophen (NORCO/VICODIN) 5-325 MG per tablet Take 1 tablet by mouth every 4 (four) hours as needed for pain.  15 tablet  0  . lisinopril (PRINIVIL,ZESTRIL) 10 MG tablet Take 1 tablet (10 mg total) by mouth daily.  30 tablet  4  . metFORMIN (GLUCOPHAGE) 500 MG tablet Take 1 tablet (500 mg total) by mouth 2 (two) times daily with a meal.  180 tablet  3  . nabumetone  (RELAFEN) 500 MG tablet Take 1 tablet (500 mg total) by mouth 2 (two) times daily.  60 tablet  2  . naproxen sodium (ANAPROX) 220 MG tablet Take 440 mg by mouth daily as needed (pain).      Marland Kitchen oxyCODONE-acetaminophen (PERCOCET) 5-325 MG per tablet Take 1-2 tablets by mouth every 6 (six) hours as needed for severe pain.  15 tablet  0  . oxymetazoline (AFRIN) 0.05 % nasal spray Place 1 spray into the nose 3 (three) times daily.       . polyethylene glycol (MIRALAX / GLYCOLAX) packet Take 17 g by mouth daily.      . polyethylene glycol powder (MIRALAX) powder Take 17 g by mouth daily.  255 g  0  . predniSONE (DELTASONE) 20 MG tablet 3 tabs po day one, then 2 po daily x 4 days  11 tablet  0  . Vitamin D, Ergocalciferol, (DRISDOL) 50000 UNITS CAPS capsule Take 1 capsule (50,000 Units total) by mouth every 7 (seven) days.  12 capsule  0   No current facility-administered medications on file prior to visit.    Allergies: No Known Allergies  Family History: Family History  Problem Relation Age of Onset  . Diabetes Mother   . Arthritis Mother   . Heart disease Mother   . Diabetes Sister   . Cancer Sister   . Arthritis Maternal Grandmother     Social History: History   Social History  . Marital Status: Married    Spouse Name: N/A    Number of Children: 8  . Years of Education: N/A   Occupational History  . truck driver    Social History Main Topics  . Smoking status: Former Smoker -- 30 years  . Smokeless tobacco: Not on file  . Alcohol Use: Yes     Comment: social  . Drug Use: Yes    Special: Marijuana  . Sexual Activity: Not on file   Other Topics Concern  . Not on file   Social History Narrative  . No narrative on file    Review of Systems:  CONSTITUTIONAL: No fevers, chills, + night sweats,no weight loss.   EYES: No visual changes or eye pain ENT: No hearing changes.  No history of nose bleeds.   RESPIRATORY: No cough, wheezing and shortness of breath.     CARDIOVASCULAR: Negative for chest pain, and palpitations.   GI: Negative for abdominal discomfort, blood in stools or black stools.  No recent change in bowel habits.   GU:  No history of incontinence.   MUSCLOSKELETAL: +history of joint pain or swelling.  +o myalgias.   SKIN: Negative for lesions, rash, and itching.   HEMATOLOGY/ONCOLOGY: Negative for prolonged bleeding, bruising easily, and swollen nodes.   ENDOCRINE: Negative for cold or heat intolerance, polydipsia or goiter.   PSYCH:  +depression or anxiety symptoms.   NEURO: As Above.  Vital Signs:  BP 118/78  Pulse 81  Ht 5' 11.26" (1.81 m)  Wt 259 lb (117.482 kg)  BMI 35.86 kg/m2  SpO2 97%  Neurological Exam: MENTAL STATUS including orientation to time, place, person, recent and remote memory, attention span and concentration, language, and fund of knowledge is normal.  Speech is not dysarthric.  CRANIAL NERVES: II:  No visual field defects.  Unremarkable fundi.   III-IV-VI: Pupils equal round and reactive to light.  Normal conjugate, extra-ocular eye movements in all directions of gaze.  No nystagmus.  No ptosis.   V:  Normal facial sensation. VII:  Normal facial symmetry and movements.   VIII:  Normal hearing and vestibular function.   IX-X:  Normal palatal movement.   XI:  Normal shoulder shrug and head rotation.   XII:  Normal tongue strength and range of motion, no deviation or fasciculation.  MOTOR:  Give-way vs pain-related weakness throughout.  Motor strength is at least antigravity in all extremities and there is no focal pattern of weakness.  No atrophy, fasciculations or abnormal movements.  No pronator drift.  Tone is normal.    MSRs:  Right                                                                 Left brachioradialis 2+  brachioradialis 2+  biceps 2+  biceps 2+  triceps 2+  triceps 2+  patellar 2+  patellar 2+  ankle jerk 2+  ankle jerk 2+  Hoffman no  Hoffman no  plantar response down   plantar response down   SENSORY:  Normal and symmetric perception of light touch, pinprick, vibration, and proprioception.  Romberg's sign absent.   COORDINATION/GAIT: Normal finger-to- nose-finger and heel-to-shin. Slowed finger tapping bilaterally.  Antalgic gait   IMPRESSION/PLAN: Mr. Muska is a 53 year-old gentleman presenting for evaluation of neck, back, and left arm pain.  His exam is non-focal.  I have reviewed his MRI imaging which shows left > right foraminal stenosis at C4-5 and disc protrusion at C5-6 with mass effect on cervical cord.  There are no myelopathic findings on exam.  EMG will be ordered to better characterize left hand symptoms (CTS vs C6 radiculopathy).  I started him on gabapentin 300mg  qhs for symptomatic control and suggested that he start PT/OT.  If there is no improvement, trial of epidural injections can be considered.  I will see him back in 4-6 weeks.   The duration of this appointment visit was 45 minutes of face-to-face time with the patient.  Greater than 50% of this time was spent in counseling, explanation of diagnosis, planning of further management, and coordination of care.   Thank you for allowing me to participate in patient's care.  If I can answer any additional questions, I would be pleased to do so.    Sincerely,    Yvonne Stopher K. Posey Pronto, DO

## 2013-05-19 ENCOUNTER — Ambulatory Visit: Payer: No Typology Code available for payment source | Attending: Physical Therapy | Admitting: Physical Therapy

## 2013-06-11 ENCOUNTER — Other Ambulatory Visit: Payer: Self-pay | Admitting: *Deleted

## 2013-06-11 DIAGNOSIS — M79602 Pain in left arm: Secondary | ICD-10-CM

## 2013-06-14 ENCOUNTER — Encounter: Payer: No Typology Code available for payment source | Admitting: Neurology

## 2013-06-15 ENCOUNTER — Telehealth: Payer: Self-pay | Admitting: Neurology

## 2013-06-15 NOTE — Telephone Encounter (Signed)
Pt no showed 06/14/13 appt w/ Dr. Posey Pronto for EMG/NCV. No show letter mailed to pt / Sherri S.

## 2013-06-17 ENCOUNTER — Ambulatory Visit: Payer: No Typology Code available for payment source | Admitting: Neurology

## 2013-06-21 ENCOUNTER — Telehealth: Payer: Self-pay | Admitting: Neurology

## 2013-06-21 NOTE — Telephone Encounter (Signed)
Pt no showed 06/17/13 follow up appt w/ Dr. Posey Pronto. No show letter mailed to pt / Sherri S.

## 2013-09-24 ENCOUNTER — Other Ambulatory Visit (HOSPITAL_COMMUNITY): Payer: Self-pay | Admitting: Urology

## 2013-09-24 DIAGNOSIS — C61 Malignant neoplasm of prostate: Secondary | ICD-10-CM

## 2013-10-12 ENCOUNTER — Ambulatory Visit (HOSPITAL_COMMUNITY)
Admission: RE | Admit: 2013-10-12 | Discharge: 2013-10-12 | Disposition: A | Discharge status: Home / Self Care | Payer: Managed Care, Other (non HMO) | Source: Ambulatory Visit | Attending: Urology | Admitting: Urology

## 2013-10-12 DIAGNOSIS — C61 Malignant neoplasm of prostate: Secondary | ICD-10-CM | POA: Insufficient documentation

## 2013-10-12 MED ORDER — GADOBENATE DIMEGLUMINE 529 MG/ML IV SOLN
20.0000 mL | Freq: Once | INTRAVENOUS | Status: AC
  Administered 2013-10-12: 20 mL via INTRAVENOUS

## 2014-02-04 DIAGNOSIS — C61 Malignant neoplasm of prostate: Secondary | ICD-10-CM

## 2014-02-04 HISTORY — DX: Malignant neoplasm of prostate: C61

## 2014-02-04 HISTORY — PX: PROSTATECTOMY: SHX69

## 2014-12-07 ENCOUNTER — Encounter (HOSPITAL_COMMUNITY): Payer: Self-pay | Admitting: *Deleted

## 2014-12-07 ENCOUNTER — Emergency Department (HOSPITAL_COMMUNITY)
Admission: EM | Admit: 2014-12-07 | Discharge: 2014-12-07 | Disposition: A | Discharge status: Home / Self Care | Payer: Managed Care, Other (non HMO) | Attending: Emergency Medicine | Admitting: Emergency Medicine

## 2014-12-07 ENCOUNTER — Other Ambulatory Visit: Payer: Self-pay

## 2014-12-07 ENCOUNTER — Emergency Department (HOSPITAL_COMMUNITY): Payer: Managed Care, Other (non HMO)

## 2014-12-07 DIAGNOSIS — Z7982 Long term (current) use of aspirin: Secondary | ICD-10-CM | POA: Insufficient documentation

## 2014-12-07 DIAGNOSIS — I10 Essential (primary) hypertension: Secondary | ICD-10-CM | POA: Insufficient documentation

## 2014-12-07 DIAGNOSIS — M5030 Other cervical disc degeneration, unspecified cervical region: Secondary | ICD-10-CM | POA: Insufficient documentation

## 2014-12-07 DIAGNOSIS — Z87891 Personal history of nicotine dependence: Secondary | ICD-10-CM | POA: Insufficient documentation

## 2014-12-07 DIAGNOSIS — E119 Type 2 diabetes mellitus without complications: Secondary | ICD-10-CM | POA: Insufficient documentation

## 2014-12-07 DIAGNOSIS — M792 Neuralgia and neuritis, unspecified: Secondary | ICD-10-CM

## 2014-12-07 DIAGNOSIS — G629 Polyneuropathy, unspecified: Secondary | ICD-10-CM | POA: Insufficient documentation

## 2014-12-07 DIAGNOSIS — Z7952 Long term (current) use of systemic steroids: Secondary | ICD-10-CM | POA: Insufficient documentation

## 2014-12-07 DIAGNOSIS — Z79899 Other long term (current) drug therapy: Secondary | ICD-10-CM | POA: Insufficient documentation

## 2014-12-07 DIAGNOSIS — R42 Dizziness and giddiness: Secondary | ICD-10-CM | POA: Insufficient documentation

## 2014-12-07 DIAGNOSIS — Z7951 Long term (current) use of inhaled steroids: Secondary | ICD-10-CM | POA: Insufficient documentation

## 2014-12-07 DIAGNOSIS — Z791 Long term (current) use of non-steroidal anti-inflammatories (NSAID): Secondary | ICD-10-CM | POA: Insufficient documentation

## 2014-12-07 MED ORDER — HYDROCODONE-ACETAMINOPHEN 5-325 MG PO TABS
1.0000 | ORAL_TABLET | Freq: Once | ORAL | Status: AC
  Administered 2014-12-07: 1 via ORAL
  Filled 2014-12-07: qty 1

## 2014-12-07 MED ORDER — OXYCODONE-ACETAMINOPHEN 5-325 MG PO TABS: 1.0000 | ORAL_TABLET | Freq: Once | ORAL | Status: DC

## 2014-12-07 MED ORDER — LORAZEPAM 1 MG PO TABS
1.0000 mg | ORAL_TABLET | Freq: Once | ORAL | Status: AC
  Administered 2014-12-07: 1 mg via ORAL
  Filled 2014-12-07: qty 1

## 2014-12-07 NOTE — Discharge Instructions (Signed)
Please follow-up with your primary care physician and neurosurgeon regarding further management. Return for new weakness/numbness, or any other symptoms concerning to you.  Degenerative Disk Disease Degenerative disk disease is a condition caused by the changes that occur in spinal disks as you grow older. Spinal disks are soft and compressible disks located between the bones of your spine (vertebrae). These disks act like shock absorbers. Degenerative disk disease can affect the whole spine. However, the neck and lower back are most commonly affected. Many changes can occur in the spinal disks with aging, such as:  The spinal disks may dry and shrink.  Small tears may occur in the tough, outer covering of the disk (annulus).  The disk space may become smaller due to loss of water.  Abnormal growths in the bone (spurs) may occur. This can put pressure on the nerve roots exiting the spinal canal, causing pain.  The spinal canal may become narrowed. RISK FACTORS   Being overweight.  Having a family history of degenerative disk disease.  Smoking.  There is increased risk if you are doing heavy lifting or have a sudden injury. SIGNS AND SYMPTOMS  Symptoms vary from person to person and may include:  Pain that varies in intensity. Some people have no pain, while others have severe pain. The location of the pain depends on the part of your backbone that is affected.  You will have neck or arm pain if a disk in the neck area is affected.  You will have pain in your back, buttocks, or legs if a disk in the lower back is affected.  Pain that becomes worse while bending, reaching up, or with twisting movements.  Pain that may start gradually and then get worse as time passes. It may also start after a major or minor injury.  Numbness or tingling in the arms or legs. DIAGNOSIS  Your health care provider will ask you about your symptoms and about activities or habits that may cause the  pain. He or she may also ask about any injuries, diseases, or treatments you have had. Your health care provider will examine you to check for the range of movement that is possible in the affected area, to check for strength in your extremities, and to check for sensation in the areas of the arms and legs supplied by different nerve roots. You may also have:   An X-ray of the spine.  Other imaging tests, such as MRI. TREATMENT  Your health care provider will advise you on the best plan for treatment. Treatment may include:  Medicines.  Rehabilitation exercises. HOME CARE INSTRUCTIONS   Follow proper lifting and walking techniques as advised by your health care provider.  Maintain good posture.  Exercise regularly as advised by your health care provider.  Perform relaxation exercises.  Change your sitting, standing, and sleeping habits as advised by your health care provider.  Change positions frequently.  Lose weight or maintain a healthy weight as advised by your health care provider.  Do not use any tobacco products, including cigarettes, chewing tobacco, or electronic cigarettes. If you need help quitting, ask your health care provider.  Wear supportive footwear.  Take medicines only as directed by your health care provider. SEEK MEDICAL CARE IF:   Your pain does not go away within 1-4 weeks.  You have significant appetite or weight loss. SEEK IMMEDIATE MEDICAL CARE IF:   Your pain is severe.  You notice weakness in your arms, hands, or legs.  You  begin to lose control of your bladder or bowel movements.  You have fevers or night sweats. MAKE SURE YOU:   Understand these instructions.  Will watch your condition.  Will get help right away if you are not doing well or get worse.   This information is not intended to replace advice given to you by your health care provider. Make sure you discuss any questions you have with your health care provider.   Document  Released: 11/18/2006 Document Revised: 02/11/2014 Document Reviewed: 05/25/2013 Elsevier Interactive Patient Education [employer].

## 2014-12-07 NOTE — ED Notes (Signed)
Patient transported to MRI 

## 2014-12-07 NOTE — ED Notes (Signed)
Pt reports left arm heaviness and numbness that started last night. Pt also reports dizziness and headache.
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
## 2015-02-05 HISTORY — PX: BILATERAL CARPAL TUNNEL RELEASE: SHX6508

## 2015-03-08 HISTORY — PX: ANTERIOR CERVICAL DECOMP/DISCECTOMY FUSION: SHX1161

## 2016-01-04 ENCOUNTER — Emergency Department (HOSPITAL_COMMUNITY)
Admission: EM | Admit: 2016-01-04 | Discharge: 2016-01-04 | Disposition: A | Discharge status: Home / Self Care | Payer: Self-pay | Attending: Emergency Medicine | Admitting: Emergency Medicine

## 2016-01-04 ENCOUNTER — Emergency Department (HOSPITAL_COMMUNITY): Payer: Self-pay

## 2016-01-04 ENCOUNTER — Encounter (HOSPITAL_COMMUNITY): Payer: Self-pay | Admitting: Emergency Medicine

## 2016-01-04 DIAGNOSIS — M546 Pain in thoracic spine: Secondary | ICD-10-CM | POA: Insufficient documentation

## 2016-01-04 DIAGNOSIS — G8929 Other chronic pain: Secondary | ICD-10-CM | POA: Insufficient documentation

## 2016-01-04 DIAGNOSIS — Z87891 Personal history of nicotine dependence: Secondary | ICD-10-CM | POA: Insufficient documentation

## 2016-01-04 DIAGNOSIS — E119 Type 2 diabetes mellitus without complications: Secondary | ICD-10-CM | POA: Insufficient documentation

## 2016-01-04 DIAGNOSIS — Z7982 Long term (current) use of aspirin: Secondary | ICD-10-CM | POA: Insufficient documentation

## 2016-01-04 DIAGNOSIS — M542 Cervicalgia: Secondary | ICD-10-CM | POA: Insufficient documentation

## 2016-01-04 DIAGNOSIS — Z7984 Long term (current) use of oral hypoglycemic drugs: Secondary | ICD-10-CM | POA: Insufficient documentation

## 2016-01-04 DIAGNOSIS — I10 Essential (primary) hypertension: Secondary | ICD-10-CM | POA: Insufficient documentation

## 2016-01-04 MED ORDER — NAPROXEN 500 MG PO TABS: 500.0000 mg | ORAL_TABLET | Freq: Two times a day (BID) | ORAL | 0 refills | Status: DC

## 2016-01-04 NOTE — Discharge Instructions (Signed)
Take your medication as prescribed. I recommend continuing to take your home pain medications as prescribed. Continue to apply heat to affected area for 15-20 minutes 3-4 times daily for additional pain relief. Refrain from doing any heavy lifting, squatting or repetitive movements of exacerbate her symptoms for the next few days. I recommend following up with your pain doctor if your symptoms have not improved over the next week. Please return to the Emergency Department if symptoms worsen or new onset of fever, numbness, tingling, groin anesthesia, loss of bowel or bladder, weakness.

## 2016-01-04 NOTE — ED Provider Notes (Signed)
[facility] DEPT Provider Note   CSN: [id number] Arrival date & time: 01/04/16  1231  By signing my name below, I, [name], attest that this documentation has been prepared under the direction and in the presence of [name], [place]. Electronically Signed: Evelene [name], Scribe. 01/04/2016. 1:41 PM.  History   Chief Complaint Chief Complaint  Patient presents with  . Neck Pain  . Shoulder Pain    The history is provided by the patient. No language interpreter was used.    HPI Comments:  [name] is a 55 y.o. male with a history of chronic pain, who presents to the Emergency Department complaining of worsening pain to his neck and mid upper back with greater pain to the right side and right shoulder. He has been experiencing this pain x a few months, worse in recent weeks. Pt is followed by a pain specialist and has been taking hydrocodone with mild relief. He denies fever, CP, SOB, acute numbness/weakness, bowel/bladder incontinence. No recent fall or injury.  Reports history of cervical spine surgery which was performed years ago.  Past Medical History:  Diagnosis Date  . Carpal tunnel syndrome   . Diabetes mellitus without complication ([place])   . Hypertension   . Sciatica   . Spinal stenosis     Patient Active Problem List   Diagnosis Date Noted  . Depression 04/07/2013  . Unspecified vitamin D deficiency 04/07/2013  . Essential hypertension, benign 02/24/2013  . Lower back pain 02/24/2013  . DM (diabetes mellitus) ([place]) 02/24/2013    Past Surgical History:  Procedure Laterality Date  . APPENDECTOMY         Home Medications    Prior to Admission medications   Medication Sig Start Date End Date Taking? Authorizing Provider  aspirin EC 81 MG tablet Take 81 mg by mouth daily.    Historical Provider, MD  atorvastatin (LIPITOR) 20 MG tablet Take 20 mg by mouth at bedtime. 09/14/14 09/14/15  Historical Provider, MD  diazepam (VALIUM) 5 MG tablet Take  1 tablet (5 mg total) by mouth every 8 (eight) hours as needed for anxiety. 01/28/13   Evelina Bucy, MD  diclofenac sodium (VOLTAREN) 1 % GEL Apply 4 g topically daily as needed (FOR LOWER BACK).  08/04/14   Historical Provider, MD  DULoxetine (CYMBALTA) 20 MG capsule Take 2 capsules (40 mg total) by mouth daily. 04/19/13   Lorayne Marek, MD  fluticasone (FLONASE) 50 MCG/ACT nasal spray Place 2 sprays into both nostrils daily.    Historical Provider, MD  gabapentin (NEURONTIN) 300 MG capsule Take 1 capsule (300 mg total) by mouth at bedtime. 05/04/13   Donika K Patel, DO  glipiZIDE (GLUCOTROL XL) 5 MG 24 hr tablet Take 5 mg by mouth daily. 09/14/14 09/14/15  Historical Provider, MD  HYDROcodone-acetaminophen (NORCO/VICODIN) 5-325 MG per tablet Take 1 tablet by mouth every 4 (four) hours as needed for pain. 10/06/12   Tatyana Kirichenko, PA-C  hydrocortisone (ANUCORT-HC) 25 MG suppository Place 1 suppository rectally daily as needed for hemorrhoids or itching.  09/28/14   Historical Provider, MD  lidocaine (LIDODERM) 5 % Place 1 patch onto the skin daily as needed (FOR LOWER BACK).  09/14/14   Historical Provider, MD  lisinopril (PRINIVIL,ZESTRIL) 10 MG tablet Take 1 tablet (10 mg total) by mouth daily. 02/24/13   Lorayne Marek, MD  metFORMIN (GLUCOPHAGE XR) 500 MG 24 hr tablet Take 1,000 mg by mouth 2 (two) times daily. 09/14/14 09/14/15  Historical Provider, MD  metFORMIN (GLUCOPHAGE)  500 MG tablet Take 1 tablet (500 mg total) by mouth 2 (two) times daily with a meal. 02/24/13   Lorayne Marek, MD  nabumetone (RELAFEN) 500 MG tablet Take 1 tablet (500 mg total) by mouth 2 (two) times daily. 02/24/13   Lorayne Marek, MD  naproxen (NAPROSYN) 500 MG tablet Take 1 tablet (500 mg total) by mouth 2 (two) times daily. 01/04/16   Nona Dell, PA-C  oxyCODONE-acetaminophen (PERCOCET) 5-325 MG per tablet Take 1-2 tablets by mouth every 6 (six) hours as needed for severe pain. 02/17/13   Robyn M Hess, PA-C    polyethylene glycol powder (MIRALAX) powder Take 17 g by mouth daily. 10/06/12   Tatyana Kirichenko, PA-C  predniSONE (DELTASONE) 20 MG tablet 3 tabs po day one, then 2 po daily x 4 days 02/17/13   Carman Ching, PA-C  sildenafil (VIAGRA) 100 MG tablet Take 100 mg by mouth daily as needed for erectile dysfunction.  11/02/14   Historical Provider, MD  Vitamin D, Ergocalciferol, (DRISDOL) 50000 UNITS CAPS capsule Take 1 capsule (50,000 Units total) by mouth every 7 (seven) days. 02/26/13   Lorayne Marek, MD    Family History Family History  Problem Relation Age of Onset  . Diabetes Mother   . Arthritis Mother   . Heart disease Mother   . Diabetes Sister   . Cancer Sister   . Arthritis Maternal Grandmother     Social History Social History  Substance Use Topics  . Smoking status: Former Smoker    Years: 30.00  . Smokeless tobacco: Never Used  . Alcohol use Yes     Comment: ocassionally     Allergies   Patient has no known allergies.   Review of Systems Review of Systems  Musculoskeletal: Positive for arthralgias, back pain and neck pain.  Neurological: Negative for weakness and numbness.  All other systems reviewed and are negative.    Physical Exam Updated Vital Signs BP 150/78 (BP Location: Left Arm)   Pulse 93   Temp 98.2 F (36.8 C) (Oral)   Resp 18   SpO2 99%   Physical Exam  Constitutional: He is oriented to person, place, and time. He appears well-developed and well-nourished.  HENT:  Head: Normocephalic and atraumatic.  Eyes: Conjunctivae and EOM are normal. Right eye exhibits no discharge. Left eye exhibits no discharge. No scleral icterus.  Neck: Normal range of motion. Neck supple.  Cardiovascular: Normal rate, regular rhythm, normal heart sounds and intact distal pulses.   Pulmonary/Chest: Effort normal and breath sounds normal. No respiratory distress. He has no wheezes. He has no rales. He exhibits no tenderness.  Abdominal: Soft. Bowel sounds are  normal. He exhibits no distension and no mass. There is no tenderness. There is no rebound and no guarding. No hernia.  Musculoskeletal: He exhibits tenderness. He exhibits no edema.  No midline C,  or L tenderness. Full range of motion of neck and back. Full range of motion of bilateral upper and lower extremities, with 5/5 strength. Sensation intact. 2+ radial and PT pulses. Cap refill <2 seconds. Patient able to stand and ambulate without assistance.  Midline thoracic tenderness TTP over mid thoracic bilateral paraspinal muscles, rhomboids, and upper trapezius    Neurological: He is alert and oriented to person, place, and time.  Skin: Skin is warm and dry.  Nursing note and vitals reviewed.    ED Treatments / Results  DIAGNOSTIC STUDIES:  Oxygen Saturation is 99% on RA, normal by my interpretation.  COORDINATION OF CARE:  1:40 PM Discussed treatment plan with pt at bedside and pt agreed to plan.  Labs (all labs ordered are listed, but only abnormal results are displayed) Labs Reviewed - No data to display  EKG  EKG Interpretation None       Radiology Dg Thoracic Spine 2 View  Result Date: 01/04/2016 CLINICAL DATA:  Back pain for about 2 months EXAM: THORACIC SPINE 2 VIEWS COMPARISON:  02/11/2014 FINDINGS: Three views of thoracic spine submitted. No acute fracture or subluxation. Alignment and vertebral body heights are preserved. Minimal dextroscoliosis. Minimal degenerative changes with anterior spurring upper thoracic spine. IMPRESSION: Negative. Electronically Signed   By: Lahoma Crocker M.D.   On: 01/04/2016 14:46    Procedures Procedures (including critical care time)  Medications Ordered in ED Medications - No data to display   Initial Impression / Assessment and Plan / ED Course  I have reviewed the triage vital signs and the nursing notes.  Pertinent labs & imaging results that were available during my care of the patient were reviewed by me and considered in  my medical decision making (see chart for details).  Clinical Course     Patient presents with worsening chronic neck and upper back pain. Denies any recent fall or injury. No neuro deficits. VSS. Exam revealed midline thoracic tenderness, TTP over mid thoracic bilateral paraspinal muscles, rhomboids, and upper trapezius. Bilateral upper extremities are neurovascularly intact. No neuro deficits. Patient declined pain medications. Thoracic spine x-ray negative. Suspect patient's symptoms are likely due to muscle strain/spasm associated with chronic pain. Discussed results and plan for discharge with patient. Plan discharge patient home with anti-inflammatory and advised him to continue taking his home contacts as prescribed. Advised patient to follow up with his pain physician for further management of his chronic pain. Discussed strict return precautions.  Final Clinical Impressions(s) / ED Diagnoses   Final diagnoses:  Chronic bilateral thoracic back pain    New Prescriptions Discharge Medication List as of 01/04/2016  3:54 PM    START taking these medications   Details  naproxen (NAPROSYN) 500 MG tablet Take 1 tablet (500 mg total) by mouth 2 (two) times daily., Starting Thu 01/04/2016, Print       I personally performed the services described in this documentation, which was scribed in my presence. The recorded information has been reviewed and is accurate.     Chesley Noon [place], [place] 01/04/16 1658    Lacretia Leigh, MD 01/05/16 [phone]

## 2016-01-04 NOTE — ED Triage Notes (Signed)
Charted in error.

## 2016-01-04 NOTE — ED Triage Notes (Signed)
Pt from home with c/o right sided neck and shoulder pain worse with movement x 3 months.  Pt reports being seen by PCP and xray was negative.  NAD, A&O.

## 2016-11-08 ENCOUNTER — Emergency Department (HOSPITAL_COMMUNITY): Payer: Self-pay

## 2016-11-08 ENCOUNTER — Emergency Department (HOSPITAL_COMMUNITY)
Admission: EM | Admit: 2016-11-08 | Discharge: 2016-11-08 | Disposition: A | Discharge status: Home / Self Care | Payer: Self-pay | Attending: Emergency Medicine | Admitting: Emergency Medicine

## 2016-11-08 ENCOUNTER — Encounter (HOSPITAL_COMMUNITY): Payer: Self-pay | Admitting: *Deleted

## 2016-11-08 DIAGNOSIS — Z7982 Long term (current) use of aspirin: Secondary | ICD-10-CM | POA: Insufficient documentation

## 2016-11-08 DIAGNOSIS — I10 Essential (primary) hypertension: Secondary | ICD-10-CM | POA: Insufficient documentation

## 2016-11-08 DIAGNOSIS — Z7984 Long term (current) use of oral hypoglycemic drugs: Secondary | ICD-10-CM | POA: Insufficient documentation

## 2016-11-08 DIAGNOSIS — E119 Type 2 diabetes mellitus without complications: Secondary | ICD-10-CM | POA: Insufficient documentation

## 2016-11-08 DIAGNOSIS — Y939 Activity, unspecified: Secondary | ICD-10-CM | POA: Insufficient documentation

## 2016-11-08 DIAGNOSIS — S29011A Strain of muscle and tendon of front wall of thorax, initial encounter: Secondary | ICD-10-CM | POA: Insufficient documentation

## 2016-11-08 DIAGNOSIS — T148XXA Other injury of unspecified body region, initial encounter: Secondary | ICD-10-CM

## 2016-11-08 DIAGNOSIS — Z87891 Personal history of nicotine dependence: Secondary | ICD-10-CM | POA: Insufficient documentation

## 2016-11-08 DIAGNOSIS — Y929 Unspecified place or not applicable: Secondary | ICD-10-CM | POA: Insufficient documentation

## 2016-11-08 DIAGNOSIS — Y999 Unspecified external cause status: Secondary | ICD-10-CM | POA: Insufficient documentation

## 2016-11-08 DIAGNOSIS — W010XXA Fall on same level from slipping, tripping and stumbling without subsequent striking against object, initial encounter: Secondary | ICD-10-CM | POA: Insufficient documentation

## 2016-11-08 LAB — CBC
HCT: 40.5 % (ref 39.0–52.0)
Hemoglobin: 12.8 g/dL — ABNORMAL LOW (ref 13.0–17.0)
MCH: 28.4 pg (ref 26.0–34.0)
MCHC: 31.6 g/dL (ref 30.0–36.0)
MCV: 89.8 fL (ref 78.0–100.0)
PLATELETS: 251 10*3/uL (ref 150–400)
RBC: 4.51 MIL/uL (ref 4.22–5.81)
RDW: 13.1 % (ref 11.5–15.5)
WBC: 5.1 10*3/uL (ref 4.0–10.5)

## 2016-11-08 LAB — BASIC METABOLIC PANEL
Anion gap: 6 (ref 5–15)
BUN: 12 mg/dL (ref 6–20)
CALCIUM: 8.5 mg/dL — AB (ref 8.9–10.3)
CHLORIDE: 104 mmol/L (ref 101–111)
CO2: 25 mmol/L (ref 22–32)
CREATININE: 0.92 mg/dL (ref 0.61–1.24)
GFR calc non Af Amer: >60 mL/min (ref >60)
Glucose, Bld: 160 mg/dL — ABNORMAL HIGH (ref 65–99)
Potassium: 3.6 mmol/L (ref 3.5–5.1)
SODIUM: 135 mmol/L (ref 135–145)

## 2016-11-08 LAB — I-STAT TROPONIN, ED: TROPONIN I, POC: 0 ng/mL (ref 0.00–0.08)

## 2016-11-08 MED ORDER — DIAZEPAM 5 MG PO TABS: 5.0000 mg | ORAL_TABLET | Freq: Two times a day (BID) | ORAL | 0 refills | Status: DC

## 2016-11-08 MED ORDER — KETOROLAC TROMETHAMINE 60 MG/2ML IM SOLN
60.0000 mg | Freq: Once | INTRAMUSCULAR | Status: AC
  Administered 2016-11-08: 60 mg via INTRAMUSCULAR
  Filled 2016-11-08: qty 2

## 2016-11-08 MED ORDER — DIAZEPAM 5 MG PO TABS
5.0000 mg | ORAL_TABLET | Freq: Once | ORAL | Status: AC
  Administered 2016-11-08: 5 mg via ORAL
  Filled 2016-11-08: qty 1

## 2016-11-08 NOTE — ED Notes (Signed)
ED Provider at bedside. 

## 2016-11-08 NOTE — ED Provider Notes (Signed)
[facility] DEPT Provider Note   CSN: [id number] Arrival date & time: 11/08/16  4917     History   Chief Complaint Chief Complaint  Patient presents with  . Chest Pain  . Fall    HPI [name] is a 56 y.o. male.  Patient is a 56 year old male with a past medical history of spinal stenosis, chronic pain, hypertension, diabetes presenting today with right-sided chest pain and right-sided neck pain which is worse with movement. Patient was recently admitted to [facility] at the end of September for left-sided chest pain. He underwent a chest pain rule out with nuclear medicine stress testing. His EKGs, stress test and troponins were all within normal limits. Patient was not found to have any cardiac cause for his pain. Patient has been following up with his PCP and his pain specialist. He has recently been referred to neurosurgery who he will see back next week. However patient was getting out his car 2 days ago when he tripped over a tree root causing him to fall onto his right side. He states he caught himself with his right arm but since that time he has had pain in his right wrist, chest and right side of his neck. He denies any numbness or tingling but states now the pain is so severe when he twists his upper body, tries to lay down to sleep or takes a deep breath. He he went to his doctor's office yesterday but they were unable to see him. He is still taking his pain medication oxycodone and naproxen as prescribed as well as his gabapentin but states it's not helping his pain. He denies any urinary or bowel or issues. No fevers. No shortness of breath or cough.   The history is provided by the patient.  Chest Pain   This is a new problem. The pain is present in the lateral region. The pain is at a severity of 8/10. The pain is severe. The quality of the pain is described as sharp, stabbing and pleuritic. The pain radiates to the upper back. Duration of episode(s) is 2  days.  Fall  Associated symptoms include chest pain.    Past Medical History:  Diagnosis Date  . Carpal tunnel syndrome   . Diabetes mellitus without complication ([place])   . Hypertension   . Sciatica   . Spinal stenosis     Patient Active Problem List   Diagnosis Date Noted  . Depression 04/07/2013  . Unspecified vitamin D deficiency 04/07/2013  . Essential hypertension, benign 02/24/2013  . Lower back pain 02/24/2013  . DM (diabetes mellitus) ([place]) 02/24/2013    Past Surgical History:  Procedure Laterality Date  . APPENDECTOMY         Home Medications    Prior to Admission medications   Medication Sig Start Date End Date Taking? Authorizing Provider  aspirin EC 81 MG tablet Take 81 mg by mouth daily.    [provider]  atorvastatin (LIPITOR) 20 MG tablet Take 20 mg by mouth at bedtime. 09/14/14 09/14/15  [provider]  diazepam (VALIUM) 5 MG tablet Take 1 tablet (5 mg total) by mouth every 8 (eight) hours as needed for anxiety. 01/28/13   Evelina Bucy, MD  diclofenac sodium (VOLTAREN) 1 % GEL Apply 4 g topically daily as needed (FOR LOWER BACK).  08/04/14   [provider]  DULoxetine (CYMBALTA) 20 MG capsule Take 2 capsules (40 mg total) by mouth daily. 04/19/13   Lorayne Marek, MD  fluticasone (FLONASE) 50 MCG/ACT nasal spray Place 2 sprays into both nostrils daily.    [provider]  gabapentin (NEURONTIN) 300 MG capsule Take 1 capsule (300 mg total) by mouth at bedtime. 05/04/13   Patel, Arvin Collard K, DO  glipiZIDE (GLUCOTROL XL) 5 MG 24 hr tablet Take 5 mg by mouth daily. 09/14/14 09/14/15  [provider]  HYDROcodone-acetaminophen (NORCO/VICODIN) 5-325 MG per tablet Take 1 tablet by mouth every 4 (four) hours as needed for pain. 10/06/12   Kirichenko, Lahoma Rocker, PA-C  hydrocortisone (ANUCORT-HC) 25 MG suppository Place 1 suppository rectally daily as needed for hemorrhoids or itching.  09/28/14   [provider]    lidocaine (LIDODERM) 5 % Place 1 patch onto the skin daily as needed (FOR LOWER BACK).  09/14/14   [provider]  lisinopril (PRINIVIL,ZESTRIL) 10 MG tablet Take 1 tablet (10 mg total) by mouth daily. 02/24/13   Lorayne Marek, MD  metFORMIN (GLUCOPHAGE XR) 500 MG 24 hr tablet Take 1,000 mg by mouth 2 (two) times daily. 09/14/14 09/14/15  [provider]  metFORMIN (GLUCOPHAGE) 500 MG tablet Take 1 tablet (500 mg total) by mouth 2 (two) times daily with a meal. 02/24/13   Advani, Vernon Prey, MD  nabumetone (RELAFEN) 500 MG tablet Take 1 tablet (500 mg total) by mouth 2 (two) times daily. 02/24/13   Lorayne Marek, MD  naproxen (NAPROSYN) 500 MG tablet Take 1 tablet (500 mg total) by mouth 2 (two) times daily. 01/04/16   Nona Dell, PA-C  oxyCODONE-acetaminophen (PERCOCET) 5-325 MG per tablet Take 1-2 tablets by mouth every 6 (six) hours as needed for severe pain. 02/17/13   Hess, [name], PA-C  polyethylene glycol powder (MIRALAX) powder Take 17 g by mouth daily. 10/06/12   Kirichenko, Lahoma Rocker, PA-C  predniSONE (DELTASONE) 20 MG tablet 3 tabs po day one, then 2 po daily x 4 days 02/17/13   Hess, [name], PA-C  sildenafil (VIAGRA) 100 MG tablet Take 100 mg by mouth daily as needed for erectile dysfunction.  11/02/14   [provider]  Vitamin D, Ergocalciferol, (DRISDOL) 50000 UNITS CAPS capsule Take 1 capsule (50,000 Units total) by mouth every 7 (seven) days. 02/26/13   Lorayne Marek, MD    Family History Family History  Problem Relation Age of Onset  . Diabetes Mother   . Arthritis Mother   . Heart disease Mother   . Diabetes Sister   . Cancer Sister   . Arthritis Maternal Grandmother     Social History Social History  Substance Use Topics  . Smoking status: Former Smoker    Years: 30.00  . Smokeless tobacco: Never Used  . Alcohol use Yes     Comment: ocassionally     Allergies   Patient has no known allergies.   Review of Systems Review of  Systems  Cardiovascular: Positive for chest pain.  All other systems reviewed and are negative.    Physical Exam Updated Vital Signs BP 138/85   Pulse (!) 54   Temp 98.3 F (36.8 C) (Oral)   Resp 14   Ht 5\' 9"  (1.753 m)   Wt 115.7 kg (255 lb)   SpO2 100%   BMI 37.66 kg/m   Physical Exam  Constitutional: He is oriented to person, place, and time. He appears well-developed and well-nourished. No distress.  HENT:  Head: Normocephalic and atraumatic.  Mouth/Throat: Oropharynx is clear and moist.  Eyes: Pupils are equal, round, and reactive to light. Conjunctivae and EOM are  normal.  Neck: Normal range of motion. Neck supple.  Cardiovascular: Normal rate, regular rhythm and intact distal pulses.   No murmur heard. Pulmonary/Chest: Effort normal and breath sounds normal. No respiratory distress. He has no wheezes. He has no rales. He exhibits tenderness.    Abdominal: Soft. He exhibits no distension. There is no tenderness. There is no rebound and no guarding.  Musculoskeletal: He exhibits tenderness. He exhibits no edema.       Right wrist: He exhibits tenderness and bony tenderness. He exhibits normal range of motion and no deformity.       Cervical back: He exhibits decreased range of motion, tenderness, pain and spasm. He exhibits no bony tenderness.       Back:  Neurological: He is alert and oriented to person, place, and time.  Skin: Skin is warm and dry. No rash noted. No erythema.  Psychiatric: He has a normal mood and affect. His behavior is normal.  Nursing note and vitals reviewed.    ED Treatments / Results  Labs (all labs ordered are listed, but only abnormal results are displayed) Labs Reviewed  BASIC METABOLIC PANEL - Abnormal; Notable for the following:       Result Value   Glucose, Bld 160 (*)    Calcium 8.5 (*)    All other components within normal limits  CBC - Abnormal; Notable for the following:    Hemoglobin 12.8 (*)    All other components  within normal limits  I-STAT TROPONIN, ED    EKG  EKG Interpretation  Date/Time:  Friday November 08 2016 06:47:14 EDT Ventricular Rate:  68 PR Interval:  168 QRS Duration: 82 QT Interval:  366 QTC Calculation: 389 R Axis:   60 Text Interpretation:  Normal sinus rhythm with sinus arrhythmia Normal ECG No significant change since last tracing Confirmed by Blanchie Dessert [phone]) on 11/08/2016 9:26:16 AM       Radiology Dg Chest 2 View  Result Date: 11/08/2016 CLINICAL DATA:  Fall yesterday with right-sided chest pain and shortness of breath, initial encounter EXAM: CHEST  2 VIEW COMPARISON:  None. FINDINGS: Cardiac shadow is within normal limits. The lungs are well aerated bilaterally. No focal infiltrate, effusion or pneumothorax is seen. Postsurgical changes are noted in the cervical spine. No acute bony abnormality is seen. IMPRESSION: No active cardiopulmonary disease. Electronically Signed   By: Inez Catalina M.D.   On: 11/08/2016 07:26    Procedures Procedures (including critical care time)  Medications Ordered in ED Medications  ketorolac (TORADOL) injection 60 mg (60 mg Intramuscular Given 11/08/16 1018)  diazepam (VALIUM) tablet 5 mg (5 mg Oral Given 11/08/16 1017)     Initial Impression / Assessment and Plan / ED Course  I have reviewed the triage vital signs and the nursing notes.  Pertinent labs & imaging results that were available during my care of the patient were reviewed by me and considered in my medical decision making (see chart for details).     Patient with a history of chronic pain which is usually relatively controlled with naproxen, gabapentin and oxycodone presenting today after mechanical fall 2 days ago with worsening pain. He now has pain in the right side of his chest that wraps around into his back, right-sided neck pain and wrist pain. This all started after the fall. He is still taking his home medication regime but it just doesn't seem to be  working. He is unable to sleep last night because he could not get  comfortable. He denies any infectious symptoms. He has no symptoms of shortness of breath or concern for pneumothorax at this time. Lower suspicion for rib fracture and feel most likely this is all muscular in nature. Patient given IM Toradol and Valium here. He is under a pain contract and has his pain medication at home. Will add on Valium to the regime to help with muscle relaxation. He already has creams and various things he can rub on the sore areas. He does have hardware in his neck so cervical spine pending to ensure normal hardware placement. Right rib films and wrist pending. Low suspicion that patient's chest pain is related to PE, dissection or ACS. Patient recently underwent stress testing, troponin and EKGs within the last 2 weeks which were all within normal limits. Patient's symptoms today seem very musculoskeletal.  12:01 PM Imaging is neg for acute findins.  Pt feeling better after therapy.  Will d/c home. Final Clinical Impressions(s) / ED Diagnoses   Final diagnoses:  Musculoskeletal strain    New Prescriptions New Prescriptions   No medications on file     Blanchie Dessert, MD 11/08/16 1203

## 2016-11-08 NOTE — ED Notes (Signed)
Patient transported to X-ray 

## 2016-11-08 NOTE — ED Triage Notes (Signed)
Patient stated he tripped over a tree stump in the yard and landed on his right side.  C/o right arm pain, right side and chest pain  Was seen at [facility] for left chest pain

## 2016-11-08 NOTE — ED Notes (Signed)
Pt reports walking with his cane and tripping over a stump in the yard states he thinks he landed on his right side. Pain with lifting right arm, pain in right ribs stating "I think I may have broken a rib", and pain in right back and chest. Also reports pain with deep inspiration.

## 2017-09-13 ENCOUNTER — Emergency Department (HOSPITAL_COMMUNITY): Payer: Medicare HMO

## 2017-09-13 ENCOUNTER — Inpatient Hospital Stay (HOSPITAL_COMMUNITY)
Admission: EM | Admit: 2017-09-13 | Discharge: 2017-09-16 | DRG: 247 | Disposition: A | Discharge status: Home / Self Care | Payer: Medicare HMO | Attending: Cardiology | Admitting: Cardiology

## 2017-09-13 ENCOUNTER — Encounter (HOSPITAL_COMMUNITY): Payer: Self-pay | Admitting: Emergency Medicine

## 2017-09-13 ENCOUNTER — Encounter (HOSPITAL_COMMUNITY)
Admission: EM | Disposition: A | Discharge status: Home / Self Care | Payer: Self-pay | Source: Home / Self Care | Attending: Cardiology

## 2017-09-13 ENCOUNTER — Other Ambulatory Visit: Payer: Self-pay

## 2017-09-13 DIAGNOSIS — E559 Vitamin D deficiency, unspecified: Secondary | ICD-10-CM | POA: Diagnosis present

## 2017-09-13 DIAGNOSIS — I214 Non-ST elevation (NSTEMI) myocardial infarction: Secondary | ICD-10-CM | POA: Diagnosis present

## 2017-09-13 DIAGNOSIS — N529 Male erectile dysfunction, unspecified: Secondary | ICD-10-CM | POA: Diagnosis present

## 2017-09-13 DIAGNOSIS — E119 Type 2 diabetes mellitus without complications: Secondary | ICD-10-CM

## 2017-09-13 DIAGNOSIS — Z7982 Long term (current) use of aspirin: Secondary | ICD-10-CM

## 2017-09-13 DIAGNOSIS — Z791 Long term (current) use of non-steroidal anti-inflammatories (NSAID): Secondary | ICD-10-CM

## 2017-09-13 DIAGNOSIS — Z79891 Long term (current) use of opiate analgesic: Secondary | ICD-10-CM

## 2017-09-13 DIAGNOSIS — R0789 Other chest pain: Secondary | ICD-10-CM | POA: Diagnosis present

## 2017-09-13 DIAGNOSIS — E785 Hyperlipidemia, unspecified: Secondary | ICD-10-CM | POA: Diagnosis not present

## 2017-09-13 DIAGNOSIS — Z9049 Acquired absence of other specified parts of digestive tract: Secondary | ICD-10-CM | POA: Diagnosis not present

## 2017-09-13 DIAGNOSIS — I119 Hypertensive heart disease without heart failure: Secondary | ICD-10-CM | POA: Diagnosis present

## 2017-09-13 DIAGNOSIS — I10 Essential (primary) hypertension: Secondary | ICD-10-CM | POA: Diagnosis not present

## 2017-09-13 DIAGNOSIS — I2510 Atherosclerotic heart disease of native coronary artery without angina pectoris: Secondary | ICD-10-CM | POA: Diagnosis not present

## 2017-09-13 DIAGNOSIS — M545 Low back pain: Secondary | ICD-10-CM | POA: Diagnosis not present

## 2017-09-13 DIAGNOSIS — Z8546 Personal history of malignant neoplasm of prostate: Secondary | ICD-10-CM | POA: Diagnosis not present

## 2017-09-13 DIAGNOSIS — Z7984 Long term (current) use of oral hypoglycemic drugs: Secondary | ICD-10-CM | POA: Diagnosis not present

## 2017-09-13 DIAGNOSIS — Z79899 Other long term (current) drug therapy: Secondary | ICD-10-CM

## 2017-09-13 DIAGNOSIS — F32A Depression, unspecified: Secondary | ICD-10-CM | POA: Diagnosis present

## 2017-09-13 DIAGNOSIS — D649 Anemia, unspecified: Secondary | ICD-10-CM

## 2017-09-13 DIAGNOSIS — I213 ST elevation (STEMI) myocardial infarction of unspecified site: Secondary | ICD-10-CM

## 2017-09-13 DIAGNOSIS — Z881 Allergy status to other antibiotic agents status: Secondary | ICD-10-CM

## 2017-09-13 DIAGNOSIS — G8929 Other chronic pain: Secondary | ICD-10-CM | POA: Diagnosis not present

## 2017-09-13 DIAGNOSIS — Z8261 Family history of arthritis: Secondary | ICD-10-CM

## 2017-09-13 DIAGNOSIS — E1159 Type 2 diabetes mellitus with other circulatory complications: Secondary | ICD-10-CM | POA: Diagnosis not present

## 2017-09-13 DIAGNOSIS — Z87891 Personal history of nicotine dependence: Secondary | ICD-10-CM

## 2017-09-13 DIAGNOSIS — Z8249 Family history of ischemic heart disease and other diseases of the circulatory system: Secondary | ICD-10-CM

## 2017-09-13 DIAGNOSIS — Z955 Presence of coronary angioplasty implant and graft: Secondary | ICD-10-CM

## 2017-09-13 DIAGNOSIS — Z86718 Personal history of other venous thrombosis and embolism: Secondary | ICD-10-CM | POA: Diagnosis not present

## 2017-09-13 DIAGNOSIS — Z6836 Body mass index (BMI) 36.0-36.9, adult: Secondary | ICD-10-CM | POA: Diagnosis not present

## 2017-09-13 DIAGNOSIS — Z833 Family history of diabetes mellitus: Secondary | ICD-10-CM

## 2017-09-13 DIAGNOSIS — F329 Major depressive disorder, single episode, unspecified: Secondary | ICD-10-CM | POA: Diagnosis not present

## 2017-09-13 DIAGNOSIS — E669 Obesity, unspecified: Secondary | ICD-10-CM | POA: Diagnosis not present

## 2017-09-13 DIAGNOSIS — R079 Chest pain, unspecified: Secondary | ICD-10-CM | POA: Diagnosis not present

## 2017-09-13 DIAGNOSIS — I5030 Unspecified diastolic (congestive) heart failure: Secondary | ICD-10-CM | POA: Diagnosis not present

## 2017-09-13 DIAGNOSIS — I2121 ST elevation (STEMI) myocardial infarction involving left circumflex coronary artery: Secondary | ICD-10-CM | POA: Diagnosis not present

## 2017-09-13 HISTORY — DX: Anxiety disorder, unspecified: F41.9

## 2017-09-13 HISTORY — DX: Hyperlipidemia, unspecified: E78.5

## 2017-09-13 HISTORY — DX: ST elevation (STEMI) myocardial infarction of unspecified site: I21.3

## 2017-09-13 HISTORY — DX: Malignant neoplasm of prostate: C61

## 2017-09-13 HISTORY — DX: Type 2 diabetes mellitus without complications: E11.9

## 2017-09-13 HISTORY — DX: Acute embolism and thrombosis of unspecified deep veins of unspecified lower extremity: I82.409

## 2017-09-13 HISTORY — DX: Personal history of nicotine dependence: Z87.891

## 2017-09-13 HISTORY — DX: Major depressive disorder, single episode, unspecified: F32.9

## 2017-09-13 HISTORY — DX: Dorsalgia, unspecified: M54.9

## 2017-09-13 HISTORY — PX: LEFT HEART CATH AND CORONARY ANGIOGRAPHY: CATH118249

## 2017-09-13 HISTORY — DX: Fibromyalgia: M79.7

## 2017-09-13 HISTORY — PX: CORONARY/GRAFT ACUTE MI REVASCULARIZATION: CATH118305

## 2017-09-13 HISTORY — DX: Depression, unspecified: F32.A

## 2017-09-13 HISTORY — DX: Male erectile dysfunction, unspecified: N52.9

## 2017-09-13 HISTORY — DX: Nonspecific reaction to tuberculin skin test without active tuberculosis: R76.11

## 2017-09-13 HISTORY — DX: Cardiac murmur, unspecified: R01.1

## 2017-09-13 HISTORY — DX: Unspecified osteoarthritis, unspecified site: M19.90

## 2017-09-13 HISTORY — DX: Other chronic pain: G89.29

## 2017-09-13 LAB — HEPATIC FUNCTION PANEL
ALK PHOS: 63 U/L (ref 38–126)
ALT: 23 U/L (ref 0–44)
AST: 37 U/L (ref 15–41)
Albumin: 3.3 g/dL — ABNORMAL LOW (ref 3.5–5.0)
Bilirubin, Direct: 0.1 mg/dL (ref 0.0–0.2)
Indirect Bilirubin: 0.8 mg/dL (ref 0.3–0.9)
Total Bilirubin: 0.9 mg/dL (ref 0.3–1.2)
Total Protein: 6.4 g/dL — ABNORMAL LOW (ref 6.5–8.1)

## 2017-09-13 LAB — CBC
HCT: 36.4 % — ABNORMAL LOW (ref 39.0–52.0)
HEMOGLOBIN: 11.5 g/dL — AB (ref 13.0–17.0)
MCH: 28 pg (ref 26.0–34.0)
MCHC: 31.6 g/dL (ref 30.0–36.0)
MCV: 88.8 fL (ref 78.0–100.0)
Platelets: 224 10*3/uL (ref 150–400)
RBC: 4.1 MIL/uL — AB (ref 4.22–5.81)
RDW: 12.4 % (ref 11.5–15.5)
WBC: 5.6 10*3/uL (ref 4.0–10.5)

## 2017-09-13 LAB — BASIC METABOLIC PANEL
ANION GAP: 11 (ref 5–15)
BUN: 12 mg/dL (ref 6–20)
CALCIUM: 8.7 mg/dL — AB (ref 8.9–10.3)
CHLORIDE: 104 mmol/L (ref 98–111)
CO2: 23 mmol/L (ref 22–32)
CREATININE: 1.08 mg/dL (ref 0.61–1.24)
GFR calc non Af Amer: >60 mL/min (ref >60)
GLUCOSE: 178 mg/dL — AB (ref 70–99)
Potassium: 3.6 mmol/L (ref 3.5–5.1)
Sodium: 138 mmol/L (ref 135–145)

## 2017-09-13 LAB — I-STAT TROPONIN, ED: TROPONIN I, POC: 0.55 ng/mL — AB (ref 0.00–0.08)

## 2017-09-13 LAB — MRSA PCR SCREENING: MRSA BY PCR: NEGATIVE

## 2017-09-13 LAB — GLUCOSE, CAPILLARY
GLUCOSE-CAPILLARY: 159 mg/dL — AB (ref 70–99)
GLUCOSE-CAPILLARY: 159 mg/dL — AB (ref 70–99)
Glucose-Capillary: 155 mg/dL — ABNORMAL HIGH (ref 70–99)

## 2017-09-13 LAB — HEMOGLOBIN A1C
Hgb A1c MFr Bld: 6.9 % — ABNORMAL HIGH (ref 4.8–5.6)
MEAN PLASMA GLUCOSE: 151.33 mg/dL

## 2017-09-13 LAB — POCT ACTIVATED CLOTTING TIME: Activated Clotting Time: 290 seconds

## 2017-09-13 LAB — TROPONIN I
TROPONIN I: 0.87 ng/mL — AB (ref <0.03)
TROPONIN I: 59.55 ng/mL — AB (ref <0.03)
Troponin I: 38.99 ng/mL (ref <0.03)

## 2017-09-13 LAB — PROTIME-INR
INR: 0.94
PROTHROMBIN TIME: 12.4 s (ref 11.4–15.2)

## 2017-09-13 LAB — HEPARIN LEVEL (UNFRACTIONATED): Heparin Unfractionated: <0.1 IU/mL — ABNORMAL LOW (ref 0.30–0.70)

## 2017-09-13 SURGERY — CORONARY/GRAFT ACUTE MI REVASCULARIZATION
Anesthesia: LOCAL

## 2017-09-13 MED ORDER — TIROFIBAN (AGGRASTAT) BOLUS VIA INFUSION
INTRAVENOUS | Status: DC | PRN
  Administered 2017-09-13: 2835 ug via INTRAVENOUS

## 2017-09-13 MED ORDER — ASPIRIN 81 MG PO CHEW
324.0000 mg | CHEWABLE_TABLET | Freq: Once | ORAL | Status: AC
  Filled 2017-09-13: qty 4

## 2017-09-13 MED ORDER — NITROGLYCERIN 1 MG/10 ML FOR IR/CATH LAB
INTRA_ARTERIAL | Status: DC | PRN
  Administered 2017-09-13: 200 ug via INTRACORONARY
  Administered 2017-09-13: 30 ug
  Administered 2017-09-13: 20 ug
  Administered 2017-09-13 (×3): 200 ug via INTRACORONARY
  Administered 2017-09-13: 200 ug

## 2017-09-13 MED ORDER — SODIUM CHLORIDE 0.9% FLUSH
3.0000 mL | Freq: Two times a day (BID) | INTRAVENOUS | Status: DC
  Administered 2017-09-13: 3 mL via INTRAVENOUS

## 2017-09-13 MED ORDER — LIDOCAINE HCL (PF) 1 % IJ SOLN
INTRAMUSCULAR | Status: AC
  Filled 2017-09-13: qty 30

## 2017-09-13 MED ORDER — ONDANSETRON HCL 4 MG/2ML IJ SOLN
4.0000 mg | Freq: Once | INTRAMUSCULAR | Status: AC
  Administered 2017-09-13: 4 mg via INTRAVENOUS
  Filled 2017-09-13: qty 2

## 2017-09-13 MED ORDER — SODIUM CHLORIDE 0.9% FLUSH: 3.0000 mL | INTRAVENOUS | Status: DC | PRN

## 2017-09-13 MED ORDER — SODIUM CHLORIDE 0.9% FLUSH
3.0000 mL | Freq: Two times a day (BID) | INTRAVENOUS | Status: DC
  Administered 2017-09-13 – 2017-09-15 (×5): 3 mL via INTRAVENOUS

## 2017-09-13 MED ORDER — NITROGLYCERIN IN D5W 200-5 MCG/ML-% IV SOLN
0.0000 ug/min | INTRAVENOUS | Status: DC
  Administered 2017-09-13: 5 ug/min via INTRAVENOUS

## 2017-09-13 MED ORDER — ASPIRIN 81 MG PO CHEW: 81.0000 mg | CHEWABLE_TABLET | Freq: Every day | ORAL | Status: DC

## 2017-09-13 MED ORDER — LABETALOL HCL 5 MG/ML IV SOLN: 10.0000 mg | INTRAVENOUS | Status: AC | PRN

## 2017-09-13 MED ORDER — MORPHINE SULFATE (PF) 4 MG/ML IV SOLN
4.0000 mg | Freq: Once | INTRAVENOUS | Status: AC
  Administered 2017-09-13: 4 mg via INTRAVENOUS
  Filled 2017-09-13: qty 1

## 2017-09-13 MED ORDER — GLIPIZIDE ER 5 MG PO TB24
5.0000 mg | ORAL_TABLET | Freq: Every day | ORAL | Status: DC
  Administered 2017-09-14: 5 mg via ORAL
  Filled 2017-09-13 (×3): qty 1

## 2017-09-13 MED ORDER — VERAPAMIL HCL 2.5 MG/ML IV SOLN
INTRAVENOUS | Status: DC | PRN
  Administered 2017-09-13: 10 mL via INTRA_ARTERIAL

## 2017-09-13 MED ORDER — MIDAZOLAM HCL 2 MG/2ML IJ SOLN
INTRAMUSCULAR | Status: DC | PRN
  Administered 2017-09-13: 2 mg via INTRAVENOUS

## 2017-09-13 MED ORDER — ACETAMINOPHEN 325 MG PO TABS: 650.0000 mg | ORAL_TABLET | ORAL | Status: DC | PRN

## 2017-09-13 MED ORDER — SODIUM CHLORIDE 0.9 % WEIGHT BASED INFUSION: 1.0000 mL/kg/h | INTRAVENOUS | Status: DC

## 2017-09-13 MED ORDER — TICAGRELOR 90 MG PO TABS
ORAL_TABLET | ORAL | Status: AC
  Filled 2017-09-13: qty 2

## 2017-09-13 MED ORDER — TIROFIBAN HCL IN NACL 5-0.9 MG/100ML-% IV SOLN
0.1500 ug/kg/min | INTRAVENOUS | Status: AC
  Filled 2017-09-13 (×2): qty 100

## 2017-09-13 MED ORDER — TICAGRELOR 90 MG PO TABS
90.0000 mg | ORAL_TABLET | Freq: Two times a day (BID) | ORAL | Status: DC
  Administered 2017-09-13 – 2017-09-16 (×6): 90 mg via ORAL
  Filled 2017-09-13 (×6): qty 1

## 2017-09-13 MED ORDER — BUSPIRONE HCL 10 MG PO TABS
10.0000 mg | ORAL_TABLET | Freq: Two times a day (BID) | ORAL | Status: DC
  Administered 2017-09-13 – 2017-09-15 (×3): 10 mg via ORAL
  Filled 2017-09-13 (×6): qty 1

## 2017-09-13 MED ORDER — LIDOCAINE HCL (PF) 1 % IJ SOLN
INTRAMUSCULAR | Status: DC | PRN
  Administered 2017-09-13: 2 mL

## 2017-09-13 MED ORDER — OXYCODONE HCL 5 MG PO TABS: 5.0000 mg | ORAL_TABLET | Freq: Three times a day (TID) | ORAL | Status: DC | PRN

## 2017-09-13 MED ORDER — TIROFIBAN HCL IV 12.5 MG/250 ML
INTRAVENOUS | Status: AC
  Filled 2017-09-13: qty 250

## 2017-09-13 MED ORDER — IOHEXOL 350 MG/ML SOLN
INTRAVENOUS | Status: DC | PRN
  Administered 2017-09-13: 260 mL via INTRAVENOUS

## 2017-09-13 MED ORDER — ATORVASTATIN CALCIUM 80 MG PO TABS
80.0000 mg | ORAL_TABLET | Freq: Every day | ORAL | Status: DC
  Administered 2017-09-14 – 2017-09-15 (×2): 80 mg via ORAL
  Filled 2017-09-13 (×3): qty 1

## 2017-09-13 MED ORDER — HEPARIN BOLUS VIA INFUSION
4000.0000 [IU] | Freq: Once | INTRAVENOUS | Status: AC
Start: 2017-09-13 — End: 2017-09-13
  Administered 2017-09-13: 4000 [IU] via INTRAVENOUS
  Filled 2017-09-13: qty 4000

## 2017-09-13 MED ORDER — LISINOPRIL 10 MG PO TABS
10.0000 mg | ORAL_TABLET | Freq: Every day | ORAL | Status: DC
  Administered 2017-09-14 – 2017-09-16 (×2): 10 mg via ORAL
  Filled 2017-09-13 (×3): qty 1

## 2017-09-13 MED ORDER — NITROGLYCERIN IN D5W 200-5 MCG/ML-% IV SOLN
INTRAVENOUS | Status: AC
  Administered 2017-09-13: 5 ug/min via INTRAVENOUS
  Filled 2017-09-13: qty 250

## 2017-09-13 MED ORDER — NITROGLYCERIN 1 MG/10 ML FOR IR/CATH LAB
INTRA_ARTERIAL | Status: AC
  Filled 2017-09-13: qty 10

## 2017-09-13 MED ORDER — NITROGLYCERIN 0.4 MG SL SUBL: 0.4000 mg | SUBLINGUAL_TABLET | SUBLINGUAL | Status: DC | PRN

## 2017-09-13 MED ORDER — FENTANYL CITRATE (PF) 100 MCG/2ML IJ SOLN
INTRAMUSCULAR | Status: DC | PRN
  Administered 2017-09-13: 50 ug via INTRAVENOUS
  Administered 2017-09-13 (×2): 25 ug via INTRAVENOUS

## 2017-09-13 MED ORDER — HYDRALAZINE HCL 20 MG/ML IJ SOLN: 5.0000 mg | INTRAMUSCULAR | Status: AC | PRN

## 2017-09-13 MED ORDER — ASPIRIN EC 81 MG PO TBEC
81.0000 mg | DELAYED_RELEASE_TABLET | Freq: Every day | ORAL | Status: DC
  Administered 2017-09-14 – 2017-09-16 (×2): 81 mg via ORAL
  Filled 2017-09-13 (×2): qty 1

## 2017-09-13 MED ORDER — HEPARIN SODIUM (PORCINE) 1000 UNIT/ML IJ SOLN
INTRAMUSCULAR | Status: AC
  Filled 2017-09-13: qty 1

## 2017-09-13 MED ORDER — ONDANSETRON HCL 4 MG/2ML IJ SOLN: 4.0000 mg | Freq: Four times a day (QID) | INTRAMUSCULAR | Status: DC | PRN

## 2017-09-13 MED ORDER — BIVALIRUDIN TRIFLUOROACETATE 250 MG IV SOLR
INTRAVENOUS | Status: AC
  Filled 2017-09-13: qty 250

## 2017-09-13 MED ORDER — BIVALIRUDIN BOLUS VIA INFUSION - CUPID
INTRAVENOUS | Status: DC | PRN
  Administered 2017-09-13: 85.05 mg via INTRAVENOUS

## 2017-09-13 MED ORDER — GABAPENTIN 300 MG PO CAPS
300.0000 mg | ORAL_CAPSULE | Freq: Three times a day (TID) | ORAL | Status: DC
  Administered 2017-09-13 – 2017-09-16 (×8): 300 mg via ORAL
  Filled 2017-09-13 (×8): qty 1

## 2017-09-13 MED ORDER — SODIUM CHLORIDE 0.9 % IV SOLN: 250.0000 mL | INTRAVENOUS | Status: DC | PRN

## 2017-09-13 MED ORDER — INSULIN ASPART 100 UNIT/ML ~~LOC~~ SOLN
0.0000 [IU] | Freq: Three times a day (TID) | SUBCUTANEOUS | Status: DC
  Administered 2017-09-13 (×2): 2 [IU] via SUBCUTANEOUS
  Administered 2017-09-14 – 2017-09-16 (×3): 1 [IU] via SUBCUTANEOUS

## 2017-09-13 MED ORDER — MUPIROCIN 2 % EX OINT
1.0000 | TOPICAL_OINTMENT | Freq: Two times a day (BID) | CUTANEOUS | Status: DC
Start: 2017-09-13 — End: 2017-09-15
  Administered 2017-09-15: 1 via NASAL
  Filled 2017-09-13 (×3): qty 22

## 2017-09-13 MED ORDER — TIROFIBAN HCL IV 12.5 MG/250 ML
INTRAVENOUS | Status: AC | PRN
  Administered 2017-09-13: 0.15 ug/kg/min via INTRAVENOUS

## 2017-09-13 MED ORDER — HEPARIN (PORCINE) IN NACL 100-0.45 UNIT/ML-% IJ SOLN
1350.0000 [IU]/h | INTRAMUSCULAR | Status: DC
  Administered 2017-09-13: 1350 [IU]/h via INTRAVENOUS
  Filled 2017-09-13: qty 250

## 2017-09-13 MED ORDER — MEMANTINE HCL 10 MG PO TABS
10.0000 mg | ORAL_TABLET | Freq: Two times a day (BID) | ORAL | Status: DC
  Administered 2017-09-13 – 2017-09-15 (×4): 10 mg via ORAL
  Filled 2017-09-13 (×7): qty 1

## 2017-09-13 MED ORDER — ZOLPIDEM TARTRATE 5 MG PO TABS
5.0000 mg | ORAL_TABLET | Freq: Every evening | ORAL | Status: DC | PRN
  Administered 2017-09-13: 5 mg via ORAL
  Filled 2017-09-13: qty 1

## 2017-09-13 MED ORDER — VERAPAMIL HCL 2.5 MG/ML IV SOLN
INTRAVENOUS | Status: AC
  Filled 2017-09-13: qty 2

## 2017-09-13 MED ORDER — SODIUM CHLORIDE 0.9 % IV SOLN
INTRAVENOUS | Status: DC
  Administered 2017-09-13 – 2017-09-14 (×4): via INTRAVENOUS

## 2017-09-13 MED ORDER — NITROGLYCERIN PEDIATRIC IV INFUSION 100 MCG/ML: 0.2500 ug/kg/min | INTRAVENOUS | Status: DC

## 2017-09-13 MED ORDER — ASPIRIN 81 MG PO CHEW: 324.0000 mg | CHEWABLE_TABLET | ORAL | Status: AC

## 2017-09-13 MED ORDER — MIDAZOLAM HCL 2 MG/2ML IJ SOLN
INTRAMUSCULAR | Status: AC
  Filled 2017-09-13: qty 2

## 2017-09-13 MED ORDER — MODAFINIL 100 MG PO TABS
100.0000 mg | ORAL_TABLET | Freq: Every day | ORAL | Status: DC
  Administered 2017-09-14: 100 mg via ORAL
  Filled 2017-09-13 (×3): qty 1

## 2017-09-13 MED ORDER — SODIUM CHLORIDE 0.9 % IV SOLN
INTRAVENOUS | Status: AC | PRN
  Administered 2017-09-13 (×2): 1.75 mg/kg/h via INTRAVENOUS

## 2017-09-13 MED ORDER — POLYETHYLENE GLYCOL 3350 17 G PO PACK
17.0000 g | PACK | Freq: Every day | ORAL | Status: DC
Start: 2017-09-13 — End: 2017-09-16
  Filled 2017-09-13 (×2): qty 1

## 2017-09-13 MED ORDER — ONDANSETRON HCL 4 MG/2ML IJ SOLN
4.0000 mg | Freq: Four times a day (QID) | INTRAMUSCULAR | Status: DC | PRN
  Administered 2017-09-13 (×2): 4 mg via INTRAVENOUS
  Filled 2017-09-13 (×2): qty 2

## 2017-09-13 MED ORDER — HEPARIN (PORCINE) IN NACL 1000-0.9 UT/500ML-% IV SOLN
INTRAVENOUS | Status: AC
  Filled 2017-09-13: qty 1000

## 2017-09-13 MED ORDER — TICAGRELOR 90 MG PO TABS
ORAL_TABLET | ORAL | Status: DC | PRN
  Administered 2017-09-13: 180 mg via ORAL

## 2017-09-13 MED ORDER — SODIUM CHLORIDE 0.9 % WEIGHT BASED INFUSION
3.0000 mL/kg/h | INTRAVENOUS | Status: AC
  Administered 2017-09-13: 3 mL/kg/h via INTRAVENOUS

## 2017-09-13 MED ORDER — DIAZEPAM 5 MG PO TABS
5.0000 mg | ORAL_TABLET | Freq: Four times a day (QID) | ORAL | Status: DC | PRN
  Administered 2017-09-15 – 2017-09-16 (×2): 5 mg via ORAL
  Filled 2017-09-13 (×2): qty 1

## 2017-09-13 MED ORDER — FENTANYL CITRATE (PF) 100 MCG/2ML IJ SOLN
INTRAMUSCULAR | Status: AC
  Filled 2017-09-13: qty 2

## 2017-09-13 MED ORDER — CITALOPRAM HYDROBROMIDE 20 MG PO TABS
40.0000 mg | ORAL_TABLET | Freq: Every day | ORAL | Status: DC
  Administered 2017-09-14 – 2017-09-16 (×3): 40 mg via ORAL
  Filled 2017-09-13 (×3): qty 2

## 2017-09-13 MED ORDER — DOCUSATE SODIUM 100 MG PO CAPS
100.0000 mg | ORAL_CAPSULE | Freq: Two times a day (BID) | ORAL | Status: DC
  Administered 2017-09-15: 100 mg via ORAL
  Filled 2017-09-13 (×4): qty 1

## 2017-09-13 MED ORDER — ASPIRIN 300 MG RE SUPP: 300.0000 mg | RECTAL | Status: AC

## 2017-09-13 SURGICAL SUPPLY — 24 items
BALLN SAPPHIRE 2.0X12 (BALLOONS) ×2
BALLN SAPPHIRE 2.5X15 (BALLOONS) ×2
BALLN SAPPHIRE ~~LOC~~ 3.25X18 (BALLOONS) ×2 IMPLANT
BALLOON SAPPHIRE 2.0X12 (BALLOONS) ×1 IMPLANT
BALLOON SAPPHIRE 2.5X15 (BALLOONS) ×1 IMPLANT
CATH INFINITI 5FR ANG PIGTAIL (CATHETERS) ×2 IMPLANT
CATH LAUNCHER 6FR EBU3.5 (CATHETERS) ×2 IMPLANT
CATH OPTITORQUE TIG 4.0 5F (CATHETERS) ×2 IMPLANT
CATH VISTA GUIDE 6FR XB3.5 (CATHETERS) ×2 IMPLANT
CATH VISTA GUIDE 6FR XBLAD3.5 (CATHETERS) ×2 IMPLANT
COVER PRB 48X5XTLSCP FOLD TPE (BAG) ×1 IMPLANT
COVER PROBE 5X48 (BAG) ×1
DEVICE RAD COMP TR BAND LRG (VASCULAR PRODUCTS) ×2 IMPLANT
GLIDESHEATH SLEND SS 6F .021 (SHEATH) ×2 IMPLANT
GUIDEWIRE INQWIRE 1.5J.035X260 (WIRE) ×1 IMPLANT
INQWIRE 1.5J .035X260CM (WIRE) ×2
KIT ENCORE 26 ADVANTAGE (KITS) ×2 IMPLANT
KIT HEART LEFT (KITS) ×2 IMPLANT
PACK CARDIAC CATHETERIZATION (CUSTOM PROCEDURE TRAY) ×2 IMPLANT
STENT RESOLUTE ONYX3.0X38 (Permanent Stent) ×2 IMPLANT
TRANSDUCER W/STOPCOCK (MISCELLANEOUS) ×2 IMPLANT
TUBING CIL FLEX 10 FLL-RA (TUBING) ×2 IMPLANT
WIRE HI TORQ BMW 190CM (WIRE) ×2 IMPLANT
WIRE PT2 MS 185 (WIRE) ×2 IMPLANT

## 2017-09-13 NOTE — ED Provider Notes (Addendum)
[facility] EMERGENCY DEPARTMENT Provider Note   CSN: [id number] Arrival date & time: 09/13/17  8325     History   Chief Complaint Chief Complaint  Patient presents with  . Chest Pain    HPI [name] is a 57 y.o. male.  HPI Patient presents to the emergency department with chest pain that started 2 days ago.  The patient states that it was initially intermittent but last night became more constant.  Patient states that he is never had any heart related issues in the past.  He states that he is a diabetic with hypertension.  He states that he has a primary care doctor at [facility].  The patient states that he did not take any medications prior to arrival for his symptoms.  The patient states that he is also having nausea as well.  Patient states he did not identify anything that seemed to make it worse.  Patient states that the pain seems to radiate into the back and both shoulders along with the bilateral neck and jaw. the patient denies headache,blurred vision, neck pain, fever, cough, weakness, numbness, dizziness, anorexia, edema, abdominal pain, vomiting, diarrhea, rash, back pain, dysuria, hematemesis, bloody stool, near syncope, or syncope. Past Medical History:  Diagnosis Date  . Carpal tunnel syndrome   . Diabetes mellitus without complication ([place])   . Hypertension   . Sciatica   . Spinal stenosis     Patient Active Problem List   Diagnosis Date Noted  . Depression 04/07/2013  . Unspecified vitamin D deficiency 04/07/2013  . Essential hypertension, benign 02/24/2013  . Lower back pain 02/24/2013  . DM (diabetes mellitus) ([name]) 02/24/2013    Past Surgical History:  Procedure Laterality Date  . APPENDECTOMY          Home Medications    Prior to Admission medications   Medication Sig Start Date End Date Taking? Authorizing Provider  aspirin EC 81 MG tablet Take 81 mg by mouth daily.   Yes [provider]  busPIRone  (BUSPAR) 10 MG tablet Take 10 mg by mouth 2 (two) times daily.   Yes [provider]  citalopram (CELEXA) 40 MG tablet Take 40 mg by mouth daily.   Yes [provider]  docusate sodium (COLACE) 100 MG capsule Take 100 mg by mouth 2 (two) times daily.   Yes [provider]  gabapentin (NEURONTIN) 300 MG capsule Take 1 capsule (300 mg total) by mouth at bedtime. Patient taking differently: Take 300 mg by mouth 3 (three) times daily.  05/04/13  Yes Patel, Donika K, DO  glipiZIDE (GLUCOTROL XL) 5 MG 24 hr tablet Take 5 mg by mouth daily. 09/14/14 09/13/25 Yes [provider]  hydrocortisone (ANUCORT-HC) 25 MG suppository Place 1 suppository rectally daily as needed for hemorrhoids or itching.  09/28/14  Yes [provider]  lisinopril (PRINIVIL,ZESTRIL) 10 MG tablet Take 1 tablet (10 mg total) by mouth daily. 02/24/13  Yes Advani, Vernon Prey, MD  meloxicam (MOBIC) 15 MG tablet Take 15 mg by mouth daily.   Yes [provider]  memantine (NAMENDA) 10 MG tablet Take 10 mg by mouth 2 (two) times daily.   Yes [provider]  metFORMIN (GLUCOPHAGE) 500 MG tablet Take 1 tablet (500 mg total) by mouth 2 (two) times daily with a meal. 02/24/13  Yes Advani, Deepak, MD  modafinil (PROVIGIL) 100 MG tablet Take 100 mg by mouth daily.   Yes [provider]  mupirocin ointment Drue Stager)  2 % Place 1 application into the nose 2 (two) times daily.   Yes [provider]  nitroGLYCERIN (NITROGLYN) 2 % OINT ointment Apply 1 application topically every 12 (twelve) hours.   Yes [provider]  oxyCODONE (OXY IR/ROXICODONE) 5 MG immediate release tablet Take 5 mg by mouth every 8 (eight) hours as needed for severe pain.   Yes [provider]  polyethylene glycol (MIRALAX / GLYCOLAX) packet Take 17 g by mouth daily.   Yes [provider]  sildenafil (VIAGRA) 100 MG tablet Take 100 mg by mouth daily as needed for erectile  dysfunction.  11/02/14  Yes [provider]  diazepam (VALIUM) 5 MG tablet Take 1 tablet (5 mg total) by mouth 2 (two) times daily. Patient not taking: Reported on 09/13/2017 11/08/16   Blanchie Dessert, MD  DULoxetine (CYMBALTA) 20 MG capsule Take 2 capsules (40 mg total) by mouth daily. Patient not taking: Reported on 09/13/2017 04/19/13   Lorayne Marek, MD  HYDROcodone-acetaminophen (NORCO/VICODIN) 5-325 MG per tablet Take 1 tablet by mouth every 4 (four) hours as needed for pain. Patient not taking: Reported on 09/13/2017 10/06/12   Jeannett Senior, PA-C  nabumetone (RELAFEN) 500 MG tablet Take 1 tablet (500 mg total) by mouth 2 (two) times daily. Patient not taking: Reported on 09/13/2017 02/24/13   Lorayne Marek, MD  naproxen (NAPROSYN) 500 MG tablet Take 1 tablet (500 mg total) by mouth 2 (two) times daily. Patient not taking: Reported on 09/13/2017 01/04/16   Nona Dell, PA-C  oxyCODONE-acetaminophen (PERCOCET) 5-325 MG per tablet Take 1-2 tablets by mouth every 6 (six) hours as needed for severe pain. Patient not taking: Reported on 09/13/2017 02/17/13   Hess, [name], PA-C  polyethylene glycol powder (MIRALAX) powder Take 17 g by mouth daily. Patient not taking: Reported on 09/13/2017 10/06/12   Jeannett Senior, PA-C  predniSONE (DELTASONE) 20 MG tablet 3 tabs po day one, then 2 po daily x 4 days Patient not taking: Reported on 09/13/2017 02/17/13   Carman Ching, PA-C  Vitamin D, Ergocalciferol, (DRISDOL) 50000 UNITS CAPS capsule Take 1 capsule (50,000 Units total) by mouth every 7 (seven) days. Patient not taking: Reported on 09/13/2017 02/26/13   Lorayne Marek, MD    Family History Family History  Problem Relation Age of Onset  . Diabetes Mother   . Arthritis Mother   . Heart disease Mother   . Diabetes Sister   . Cancer Sister   . Arthritis Maternal Grandmother     Social History Social History   Tobacco Use  . Smoking status: Former Smoker    Years:  30.00  . Smokeless tobacco: Never Used  Substance Use Topics  . Alcohol use: Yes    Comment: ocassionally  . Drug use: Yes    Types: Marijuana     Allergies   Cephalexin   Review of Systems Review of Systems All other systems negative except as documented in the HPI. All pertinent positives and negatives as reviewed in the HPI.  Physical Exam Updated Vital Signs BP (!) 149/100   Pulse 72   Temp 98.6 F (37 C) (Oral)   Resp (!) 82   Ht 5\' 9"  (1.753 m)   Wt 113.4 kg   SpO2 99%   BMI 36.92 kg/m   Physical Exam  Constitutional: He is oriented to person, place, and time. He appears well-developed and well-nourished. No distress.  HENT:  Head: Normocephalic and atraumatic.  Mouth/Throat: Oropharynx is clear and moist.  Eyes: Pupils are equal, round, and reactive to light.  Neck: Normal range of motion. Neck supple.  Cardiovascular: Normal rate, regular rhythm and normal heart sounds. Exam reveals no gallop and no friction rub.  No murmur heard. Pulmonary/Chest: Effort normal and breath sounds normal. No respiratory distress. He has no wheezes. He has no rhonchi. He has no rales.  Abdominal: Soft. Bowel sounds are normal. He exhibits no distension. There is no tenderness.  Neurological: He is alert and oriented to person, place, and time. He exhibits normal muscle tone. Coordination normal.  Skin: Skin is warm and dry. Capillary refill takes less than 2 seconds. No rash noted. No erythema.  Psychiatric: He has a normal mood and affect. His behavior is normal.  Nursing note and vitals reviewed.    ED Treatments / Results  Labs (all labs ordered are listed, but only abnormal results are displayed) Labs Reviewed  BASIC METABOLIC PANEL - Abnormal; Notable for the following components:      Result Value   Glucose, Bld 178 (*)    Calcium 8.7 (*)    All other components within normal limits  CBC - Abnormal; Notable for the following components:   RBC 4.10 (*)     Hemoglobin 11.5 (*)    HCT 36.4 (*)    All other components within normal limits  I-STAT TROPONIN, ED - Abnormal; Notable for the following components:   Troponin i, poc 0.55 (*)    All other components within normal limits  PROTIME-INR  HEPARIN LEVEL (UNFRACTIONATED)    EKG None  Radiology Dg Chest 2 View  Result Date: 09/13/2017 CLINICAL DATA:  Chest pain EXAM: CHEST - 2 VIEW COMPARISON:  11/08/2016 FINDINGS: Mild cardiomegaly and pulmonary vascular congestion without overt edema. No pleural effusion or pneumothorax. No focal airspace consolidation. IMPRESSION: On cardiomegaly and pulmonary vascular congestion. Electronically Signed   By: Ulyses Jarred M.D.   On: 09/13/2017 05:54    Procedures Procedures (including critical care time)  Medications Ordered in ED Medications  heparin bolus via infusion 4,000 Units (has no administration in time range)  heparin ADULT infusion 100 units/mL (25000 units/279mL sodium chloride 0.45%) (has no administration in time range)  nitroGLYCERIN 50 mg in dextrose 5 % 250 mL (0.2 mg/mL) infusion (5 mcg/min Intravenous New Bag/Given 09/13/17 0710)  ondansetron (ZOFRAN) injection 4 mg (4 mg Intravenous Given 09/13/17 9628)  morphine 4 MG/ML injection 4 mg (4 mg Intravenous Given 09/13/17 0708)  ondansetron (ZOFRAN) injection 4 mg (4 mg Intravenous Given 09/13/17 0708)  aspirin chewable tablet 324 mg (324 mg Oral Given by EMS 09/13/17 3662)     Initial Impression / Assessment and Plan / ED Course  I have reviewed the triage vital signs and the nursing notes.  Pertinent labs & imaging results that were available during my care of the patient were reviewed by me and considered in my medical decision making (see chart for details).     I have contacted cardiology and awaiting their return phone call.  The patient is most likely having an acute coronary event based on the fact that he has EKG changes along with an elevated troponin.  I have started the  patient on heparin, nitroglycerin drip, morphine and Zofran.  Patient received 324 aspirin via EMS.  I did ask for repeat EKG.   CRITICAL CARE Performed by: Resa Miner Joann Kulpa Total critical care time: 30 minutes Critical care time was exclusive of separately billable procedures and treating other patients. Critical care was  necessary to treat or prevent imminent or life-threatening deterioration. Critical care was time spent personally by me on the following activities: development of treatment plan with patient and/or surrogate as well as nursing, discussions with consultants, evaluation of patient's response to treatment, examination of patient, obtaining history from patient or surrogate, ordering and performing treatments and interventions, ordering and review of laboratory studies, ordering and review of radiographic studies, pulse oximetry and re-evaluation of patient's condition.  7:28 AM Re-paged cardiology and still awaiting return phone call 7:32 AM I requested that the secretary page the STEMI doctor on call due to the fact it has been over 30 minutes since the initial page for cardiology was placed. Patient is still having midsternal chest pain with radiation to the neck back and shoulders which is better but still present.  Will increase the nitro drip at this time. 7:44 AM I spoke with Dr. Radford Pax of cardiology who will come and evaluate the patient. Final Clinical Impressions(s) / ED Diagnoses   Final diagnoses:  None    ED Discharge Orders    None          Dalia Heading, PA-C 09/13/17 4462    Davonna Belling, MD 09/13/17 [phone]

## 2017-09-13 NOTE — ED Notes (Signed)
ED Provider at bedside. 

## 2017-09-13 NOTE — ED Notes (Signed)
Josehua Hammar [phone] (Wife)

## 2017-09-13 NOTE — ED Notes (Signed)
EDP ( Dr. Venora Maples) notified on elevated Troponin result.

## 2017-09-13 NOTE — ED Notes (Signed)
RN Emilie informed of troponin results .88 Dr Venora Maples in [facility]

## 2017-09-13 NOTE — Progress Notes (Signed)
[facility] for heparin  Indication: chest pain/ACS  Allergies  Allergen Reactions  . Cephalexin Hives and Swelling    Patient Measurements: Height: 5\' 9"  (175.3 cm) Weight: 250 lb (113.4 kg) IBW/kg (Calculated) : 70.7 Heparin Dosing Weight: 96kg  Vital Signs: Temp: 98.6 F (37 C) (08/10 0511) Temp Source: Oral (08/10 0511) BP: 150/96 (08/10 0648) Pulse Rate: 79 (08/10 0648)  Labs: Recent Labs    09/13/17 0500  HGB 11.5*  HCT 36.4*  PLT 224  LABPROT 12.4  INR 0.94  CREATININE 1.08    Estimated Creatinine Clearance: 96 mL/min (by C-G formula based on SCr of 1.08 mg/dL).   Medical History: Past Medical History:  Diagnosis Date  . Carpal tunnel syndrome   . Diabetes mellitus without complication ([place])   . Hypertension   . Sciatica   . Spinal stenosis      Assessment: 57 yo male with CP. Pharmacy consulted to dose heparin. No anticoagulants noted PTA.  Goal of Therapy:  Heparin level 0.3-0.7 units/ml Monitor platelets by anticoagulation protocol: Yes   Plan:  -Heparin bolus 4000 units IV followed by 1350 units/hr (~14 units/kg/hr) -Heparin level in 6 hours and daily wth CBC daily  Hildred Laser, PharmD Clinical Pharmacist Please check Amion for pharmacy contact number

## 2017-09-13 NOTE — ED Notes (Signed)
Date and time results received: 09/13/17 5:51 AM (use smartphrase ".now" to insert current time)  Test: istat troponin Critical Value: 0.55  Name of Provider Notified: Dr. Venora Maples  Orders Received? Or Actions Taken?: verbal order for repeat EKG

## 2017-09-13 NOTE — ED Triage Notes (Signed)
Patient reports intermittent central/left chest pain radiating to left arm and jaw onset yesterday with mild SOB and nausea , denies emesis / no diaphoresis.

## 2017-09-13 NOTE — ED Notes (Signed)
Cardiology at bedside.

## 2017-09-13 NOTE — H&P (Addendum)
Cardiology History & Physical    Patient ID: Jailin Moomaw MRN: [id number], DOB: Jun 22, 1960 Date of Encounter: 09/13/2017, 8:21 AM Primary Physician: [name], MD Primary Cardiologist: New, being seen by Dr. Stanford Breed  Chief Complaint: chest pain Reason for Admission: NSTEMI Requesting MD: Irena Cords PA-C  HPI: Sricharan Lacomb is a 57 y.o. male with no prior cardiac hx but HTN, HLD, DM, prostate CA s/p prostate surgery, ED, former tobacco abuse, chronic back pain on opiates, carpal tunnel surgery, DVT 2 yrs ago after prostate surgery (tx with blood thinners, no longer on) who presented to [facility] with chest pain. He reports overnight he awoke with substernal chest pain. He ambulated to bathroom but due to severe persistent chest pain with bilateral arm pain, SOB, nausea and vomiting he asked his wife to call 911 immediately. They gave him 4 baby ASA and SL with initial improvement in CP but this recurred in the ER to ongoing 8/10 pain despite morphine, IV heparin and IV NTG. He is also now diaphoretic. Last meal 6pm, did not yet get to take regular AM meds today due to this AM's events. ER course notable for HTN up to 156/100, HR 70s-80s, glucose 178, troponin 0.55, Hgb 11.5, CXR shows cardiomegaly and pulm vascular congestion without edema. No LEE or orthopnea. No other recent unusual sx.  Past Medical History:  Diagnosis Date  . Carpal tunnel syndrome   . Diabetes mellitus without complication ([place])   . DVT (deep venous thrombosis) ([place])    a. around 2017 following prostate surgery, treated with course of blood thinners.  . Erectile dysfunction   . Former tobacco use   . Hyperlipidemia   . Hypertension   . Prostate cancer [facility])    a. s/p prostate surgery.  . Sciatica   . Spinal stenosis      Surgical History:  Past Surgical History:  Procedure Laterality Date  . APPENDECTOMY       Home Meds: Prior to Admission medications   Medication Sig Start Date End Date Taking?  Authorizing Provider  aspirin EC 81 MG tablet Take 81 mg by mouth daily.   Yes [provider]  busPIRone (BUSPAR) 10 MG tablet Take 10 mg by mouth 2 (two) times daily.   Yes [provider]  citalopram (CELEXA) 40 MG tablet Take 40 mg by mouth daily.   Yes [provider]  docusate sodium (COLACE) 100 MG capsule Take 100 mg by mouth 2 (two) times daily.   Yes [provider]  gabapentin (NEURONTIN) 300 MG capsule Take 1 capsule (300 mg total) by mouth at bedtime. Patient taking differently: Take 300 mg by mouth 3 (three) times daily.  05/04/13  Yes Patel, Donika K, DO  glipiZIDE (GLUCOTROL XL) 5 MG 24 hr tablet Take 5 mg by mouth daily. 09/14/14 09/13/25 Yes [provider]  hydrocortisone (ANUCORT-HC) 25 MG suppository Place 1 suppository rectally daily as needed for hemorrhoids or itching.  09/28/14  Yes [provider]  lisinopril (PRINIVIL,ZESTRIL) 10 MG tablet Take 1 tablet (10 mg total) by mouth daily. 02/24/13  Yes Advani, Vernon Prey, MD  meloxicam (MOBIC) 15 MG tablet Take 15 mg by mouth daily.   Yes [provider]  memantine (NAMENDA) 10 MG tablet Take 10 mg by mouth 2 (two) times daily.   Yes [provider]  metFORMIN (GLUCOPHAGE) 500 MG tablet Take 1 tablet (500 mg total) by mouth 2 (two) times daily with a meal. 02/24/13  Yes Lorayne Marek, MD  modafinil (PROVIGIL) 100 MG tablet Take 100 mg by mouth daily.   Yes [provider]  mupirocin ointment (BACTROBAN) 2 % Place 1 application into the nose 2 (two) times daily.   Yes [provider]  nitroGLYCERIN (NITROGLYN) 2 % OINT ointment Apply 1 application topically every 12 (twelve) hours.   Yes [provider]  oxyCODONE (OXY IR/ROXICODONE) 5 MG immediate release tablet Take 5 mg by mouth every 8 (eight) hours as needed for severe pain.   Yes [provider]  polyethylene glycol (MIRALAX / GLYCOLAX) packet Take 17 g by mouth daily.   Yes  [provider]  sildenafil (VIAGRA) 100 MG tablet Take 100 mg by mouth daily as needed for erectile dysfunction.  11/02/14  Yes [provider]  diazepam (VALIUM) 5 MG tablet Take 1 tablet (5 mg total) by mouth 2 (two) times daily. Patient not taking: Reported on 09/13/2017 11/08/16   Blanchie Dessert, MD  DULoxetine (CYMBALTA) 20 MG capsule Take 2 capsules (40 mg total) by mouth daily. Patient not taking: Reported on 09/13/2017 04/19/13   Lorayne Marek, MD  HYDROcodone-acetaminophen (NORCO/VICODIN) 5-325 MG per tablet Take 1 tablet by mouth every 4 (four) hours as needed for pain. Patient not taking: Reported on 09/13/2017 10/06/12   Jeannett Senior, PA-C  nabumetone (RELAFEN) 500 MG tablet Take 1 tablet (500 mg total) by mouth 2 (two) times daily. Patient not taking: Reported on 09/13/2017 02/24/13   Lorayne Marek, MD  naproxen (NAPROSYN) 500 MG tablet Take 1 tablet (500 mg total) by mouth 2 (two) times daily. Patient not taking: Reported on 09/13/2017 01/04/16   Nona Dell, PA-C  oxyCODONE-acetaminophen (PERCOCET) 5-325 MG per tablet Take 1-2 tablets by mouth every 6 (six) hours as needed for severe pain. Patient not taking: Reported on 09/13/2017 02/17/13   Hess, [name], PA-C  polyethylene glycol powder (MIRALAX) powder Take 17 g by mouth daily. Patient not taking: Reported on 09/13/2017 10/06/12   Jeannett Senior, PA-C  predniSONE (DELTASONE) 20 MG tablet 3 tabs po day one, then 2 po daily x 4 days Patient not taking: Reported on 09/13/2017 02/17/13   Carman Ching, PA-C  Vitamin D, Ergocalciferol, (DRISDOL) 50000 UNITS CAPS capsule Take 1 capsule (50,000 Units total) by mouth every 7 (seven) days. Patient not taking: Reported on 09/13/2017 02/26/13   Lorayne Marek, MD    Allergies:  Allergies  Allergen Reactions  . Cephalexin Hives and Swelling    Social History   Socioeconomic History  . Marital status: Married    Spouse name: Not on file  . Number of  children: 8  . Years of education: Not on file  . Highest education level: Not on file  Occupational History  . Occupation: truck Animator Needs  . Financial resource strain: Not on file  . Food insecurity:    Worry: Not on file    Inability: Not on file  . Transportation needs:    Medical: Not on file    Non-medical: Not on file  Tobacco Use  . Smoking status: Former Research scientist (life sciences)  . Smokeless tobacco: Never Used  . Tobacco comment: quit 20 yrs ago  Substance and Sexual Activity  . Alcohol use: Yes    Comment: rare - once a month  . Drug use: Not Currently  . Sexual activity: Not on file  Lifestyle  . Physical activity:    Days per week: Not on file    Minutes per session: Not on file  .  Stress: Not on file  Relationships  . Social connections:    Talks on phone: Not on file    Gets together: Not on file    Attends religious service: Not on file    Active member of club or organization: Not on file    Attends meetings of clubs or organizations: Not on file    Relationship status: Not on file  . Intimate partner violence:    Fear of current or ex partner: Not on file    Emotionally abused: Not on file    Physically abused: Not on file    Forced sexual activity: Not on file  Other Topics Concern  . Not on file  Social History Narrative   He lives with wife and daughter.   He is currently not working since October 2014.  He was working as a Administrator, previously was doing long distance, but now only locally.      Family History  Problem Relation Age of Onset  . Diabetes Mother   . Arthritis Mother   . Heart disease Mother        stents  . Diabetes Sister   . Cancer Sister   . Arthritis Maternal Grandmother     Review of Systems:no hx TIA/stroke or bleeding All other systems reviewed and are otherwise negative except as noted above.  Labs:   Lab Results  Component Value Date   WBC 5.6 09/13/2017   HGB 11.5 (L) 09/13/2017   HCT 36.4 (L) 09/13/2017    MCV 88.8 09/13/2017   PLT 224 09/13/2017    Recent Labs  Lab 09/13/17 0500  NA 138  K 3.6  CL 104  CO2 23  BUN 12  CREATININE 1.08  CALCIUM 8.7*  GLUCOSE 178*    Lab Results  Component Value Date   CHOL 199 02/24/2013   HDL 45 02/24/2013   LDLCALC 117 (H) 02/24/2013   TRIG 186 (H) 02/24/2013   No results found for: DDIMER  Radiology/Studies:  Dg Chest 2 View  Result Date: 09/13/2017 CLINICAL DATA:  Chest pain EXAM: CHEST - 2 VIEW COMPARISON:  11/08/2016 FINDINGS: Mild cardiomegaly and pulmonary vascular congestion without overt edema. No pleural effusion or pneumothorax. No focal airspace consolidation. IMPRESSION: On cardiomegaly and pulmonary vascular congestion. Electronically Signed   By: Ulyses Jarred M.D.   On: 09/13/2017 05:54   Wt Readings from Last 3 Encounters:  09/13/17 113.4 kg  11/08/16 115.7 kg  12/07/14 108.9 kg    EKG: most recent NSR with significant inferior ST depression as well as V3-V6 new from prior  Physical Exam: Blood pressure (!) 142/89, pulse 80, temperature 98.6 F (37 C), temperature source Oral, resp. rate 10, height 5\' 9"  (1.753 m), weight 113.4 kg, SpO2 94 %. Body mass index is 36.92 kg/m. General: Well developed, well nourished obese AAM, in no acute distress. Holding chest, sweaty Head: Normocephalic, atraumatic, sclera non-icteric, no xanthomas, nares are without discharge.  Neck: Negative for carotid bruits. JVD not elevated. Lungs: Clear bilaterally to auscultation without wheezes, rales, or rhonchi. Breathing is unlabored. Heart: RRR with S1 S2. No murmurs, rubs, or gallops appreciated. Abdomen: Soft, non-tender, non-distended with normoactive bowel sounds. No hepatomegaly. No rebound/guarding. No obvious abdominal masses. Msk:  Strength and tone appear normal for age. Extremities: No clubbing or cyanosis. No edema.  Distal pedal pulses are 2+ and equal bilaterally. Neuro: Alert and oriented X 3. No focal deficit. No facial  asymmetry. Moves all extremities spontaneously. Psych:  Responds  to questions appropriately with a normal affect.    Assessment and Plan   1. NSTEMI with ongoing chest pain - chest pain has recurred despite "cool off measures," associated with ongoing EKG changes. He received ASA and is on IV heparin/IV NTG. Would recommend proceeding with cath this AM. Risks and benefits of cardiac catheterization have been discussed with the patient.  These include bleeding, infection, kidney damage, stroke, heart attack, death.  The patient understands these risks and is willing to proceed. Cath lab team called in via [place]. Will continue aspirin, add statin, and anticipate beta blocker will be added if BP/HR stable post cath. Check echo.  2. Diabetes mellitus - can resume glipizide in AM, hold metformin. Check A1C and add SSI.  3. HTN - continue home ACEI, suspect beta blocker will be appropriate post-procedure depending on how he tolerates case.  4. Hyperlipidemia - no lipids on file. Obtain in AM along with baseline LFTs. Start atorva 80mg  qpm. If the patient is tolerating statin at time of follow-up appointment, would consider rechecking liver function/lipid panel in 6-8 weeks.  5. Mild anemia, normocytic - prior Hgb 12.8 in 2018, today's 11.5. No bleeding reported. Trend CBC and check hemoccult with next stool, put in order for RN to collect.  6. Depression - home meds continued. No SI/HI noted.  7. Chronic pain - continue home opiates but hold meloxicam given acute MI.  8. Hx of DVT - without known clinical recurrence. No longer on AC. Felt provoked by prostate surgery.   Severity of Illness: The appropriate patient status for this patient is INPATIENT. Inpatient status is judged to be reasonable and necessary in order to provide the required intensity of service to ensure the patient's safety. The patient's presenting symptoms, physical exam findings, and initial radiographic and laboratory data  in the context of their chronic comorbidities is felt to place them at high risk for further clinical deterioration. Furthermore, it is not anticipated that the patient will be medically stable for discharge from the hospital within 2 midnights of admission. The following factors support the patient status of inpatient.   " The patient's presenting symptoms include chest pain. " The worrisome physical exam findings include diaphoresis. " The initial radiographic and laboratory data are worrisome because of elevated troponin. " The chronic co-morbidities include as above, DM HTN HLD former tobacco.   * I certify that at the point of admission it is my clinical judgment that the patient will require inpatient hospital care spanning beyond 2 midnights from the point of admission due to high intensity of service, high risk for further deterioration and high frequency of surveillance required.*    For questions or updates, please contact [place] Please consult [email or web address] for contact info under Cardiology/STEMI.  Signed, Charlie Pitter, PA-C 09/13/2017, 8:21 AM  As above, patient seen and examined.  Briefly he is a 57 year old male with past medical history of diabetes mellitus, hypertension, hyperlipidemia, prostate cancer with non-ST elevation myocardial infarction.  Patient has had intermittent chest pain over the past 2 days.  It is substernal radiating to his neck and upper extremities bilaterally.  He awoke at 4 AM this morning with persistent pain and there is associated diaphoresis, dyspnea and nausea.  He presented to the emergency room and at present has ongoing chest pain.  Electrocardiogram shows sinus rhythm with diffuse ST depression.  Troponin 0 0.55.  1 non-ST elevation myocardial infarction-patient presents with persistent pain despite aspirin, heparin and nitroglycerin.  His  troponin is elevated and his electrocardiogram shows diffuse ST depression.  Plan emergent cardiac  catheterization.  The risks and benefits including myocardial infarction, CVA and death discussed and he agrees to proceed.  We will continue with medications as outlined above.  Add beta-blockade as tolerated by pulse and blood pressure.  Add Lipitor 80 mg daily.  2 hypertension-patient will need beta-blockade following cardiac catheterization/intervention as needed.  We will then follow blood pressure and adjust regimen as needed.  3 hyperlipidemia-begin Lipitor 80 mg daily.  Check lipids and liver in 4 weeks.  4 diabetes mellitus-hold metformin for 48 hours following procedure.  Follow CBGs.  [name], MD

## 2017-09-14 ENCOUNTER — Inpatient Hospital Stay (HOSPITAL_COMMUNITY): Payer: Medicare HMO

## 2017-09-14 DIAGNOSIS — I5030 Unspecified diastolic (congestive) heart failure: Secondary | ICD-10-CM

## 2017-09-14 LAB — CBC
HEMATOCRIT: 30.9 % — AB (ref 39.0–52.0)
Hemoglobin: 9.6 g/dL — ABNORMAL LOW (ref 13.0–17.0)
MCH: 28.2 pg (ref 26.0–34.0)
MCHC: 31.1 g/dL (ref 30.0–36.0)
MCV: 90.6 fL (ref 78.0–100.0)
PLATELETS: 232 10*3/uL (ref 150–400)
RBC: 3.41 MIL/uL — ABNORMAL LOW (ref 4.22–5.81)
RDW: 12.8 % (ref 11.5–15.5)
WBC: 7.3 10*3/uL (ref 4.0–10.5)

## 2017-09-14 LAB — BASIC METABOLIC PANEL
Anion gap: 12 (ref 5–15)
BUN: 10 mg/dL (ref 6–20)
CHLORIDE: 106 mmol/L (ref 98–111)
CO2: 21 mmol/L — ABNORMAL LOW (ref 22–32)
Calcium: 8.1 mg/dL — ABNORMAL LOW (ref 8.9–10.3)
Creatinine, Ser: 1.01 mg/dL (ref 0.61–1.24)
GFR calc Af Amer: >60 mL/min (ref >60)
GFR calc non Af Amer: >60 mL/min (ref >60)
GLUCOSE: 158 mg/dL — AB (ref 70–99)
Potassium: 3.7 mmol/L (ref 3.5–5.1)
Sodium: 139 mmol/L (ref 135–145)

## 2017-09-14 LAB — GLUCOSE, CAPILLARY
GLUCOSE-CAPILLARY: 114 mg/dL — AB (ref 70–99)
Glucose-Capillary: 131 mg/dL — ABNORMAL HIGH (ref 70–99)
Glucose-Capillary: 138 mg/dL — ABNORMAL HIGH (ref 70–99)
Glucose-Capillary: 103 mg/dL — ABNORMAL HIGH (ref 70–99)

## 2017-09-14 LAB — LIPID PANEL
Cholesterol: 137 mg/dL (ref 0–200)
HDL: 46 mg/dL (ref >40)
LDL CALC: 78 mg/dL (ref 0–99)
TRIGLYCERIDES: 66 mg/dL (ref <150)
Total CHOL/HDL Ratio: 3 RATIO
VLDL: 13 mg/dL (ref 0–40)

## 2017-09-14 LAB — ECHOCARDIOGRAM COMPLETE
Height: 69 in
WEIGHTICAEL: 3925.95 [oz_av]

## 2017-09-14 LAB — HIV ANTIBODY (ROUTINE TESTING W REFLEX): HIV SCREEN 4TH GENERATION: NONREACTIVE

## 2017-09-14 MED ORDER — ASPIRIN 81 MG PO CHEW
81.0000 mg | CHEWABLE_TABLET | ORAL | Status: AC
  Administered 2017-09-15: 81 mg via ORAL
  Filled 2017-09-14: qty 1

## 2017-09-14 MED ORDER — SODIUM CHLORIDE 0.9 % WEIGHT BASED INFUSION
1.0000 mL/kg/h | INTRAVENOUS | Status: DC
  Administered 2017-09-15 (×2): 1 mL/kg/h via INTRAVENOUS

## 2017-09-14 MED ORDER — SODIUM CHLORIDE 0.9 % IV SOLN: 250.0000 mL | INTRAVENOUS | Status: DC | PRN

## 2017-09-14 MED ORDER — SODIUM CHLORIDE 0.9% FLUSH: 3.0000 mL | INTRAVENOUS | Status: DC | PRN

## 2017-09-14 MED ORDER — SODIUM CHLORIDE 0.9% FLUSH
3.0000 mL | Freq: Two times a day (BID) | INTRAVENOUS | Status: DC
  Administered 2017-09-14 – 2017-09-15 (×2): 3 mL via INTRAVENOUS

## 2017-09-14 MED ORDER — METOPROLOL TARTRATE 12.5 MG HALF TABLET
12.5000 mg | ORAL_TABLET | Freq: Two times a day (BID) | ORAL | Status: DC
  Administered 2017-09-14 – 2017-09-15 (×3): 12.5 mg via ORAL
  Filled 2017-09-14 (×4): qty 1

## 2017-09-14 NOTE — Progress Notes (Signed)
Progress Note  Patient Name: [name] Date of Encounter: 09/14/2017  Primary Cardiologist: [name], MD   Subjective   Mild chest pain with inspiration but no symptoms similar to presentation.  No dyspnea.  Inpatient Medications    Scheduled Meds: . aspirin  324 mg Oral NOW   Or  . aspirin  300 mg Rectal NOW  . aspirin EC  81 mg Oral Daily  . atorvastatin  80 mg Oral q1800  . busPIRone  10 mg Oral BID  . citalopram  40 mg Oral Daily  . docusate sodium  100 mg Oral BID  . gabapentin  300 mg Oral TID  . glipiZIDE  5 mg Oral Daily  . insulin aspart  0-9 Units Subcutaneous TID WC  . lisinopril  10 mg Oral Daily  . memantine  10 mg Oral BID  . modafinil  100 mg Oral Daily  . mupirocin ointment  1 application Nasal BID  . polyethylene glycol  17 g Oral Daily  . sodium chloride flush  3 mL Intravenous Q12H  . ticagrelor  90 mg Oral BID   Continuous Infusions: . sodium chloride 100 mL/hr at 09/14/17 0600  . sodium chloride    . nitroGLYCERIN 5 mcg/min (09/14/17 0600)   PRN Meds: sodium chloride, acetaminophen, diazepam, nitroGLYCERIN, ondansetron (ZOFRAN) IV, oxyCODONE, sodium chloride flush, zolpidem   Vital Signs    Vitals:   09/14/17 0530 09/14/17 0600 09/14/17 0630 09/14/17 0700  BP: 110/78 126/77 129/76   Pulse: 76 84 78 97  Resp: 19 (!) 24 (!) 24 (!) 26  Temp:      TempSrc:      SpO2: 99% 97% 99% 98%  Weight:    111.3 kg  Height:        Intake/Output Summary (Last 24 hours) at 09/14/2017 0745 Last data filed at 09/14/2017 0700 Gross per 24 hour  Intake 2777.57 ml  Output 775 ml  Net 2002.57 ml   Filed Weights   09/13/17 0511 09/14/17 0700  Weight: 113.4 kg 111.3 kg    Telemetry    NSR with PVCs and NSVT- Personally Reviewed   Physical Exam   GEN: No acute distress.   Neck: No JVD Cardiac: RRR, no murmurs, rubs, or gallops.  Respiratory: Clear to auscultation bilaterally. GI: Soft, nontender, non-distended  MS: No edema; Radial  cath site with no hematoma Neuro:  Nonfocal  Psych: Normal affect   Labs    Chemistry Recent Labs  Lab 09/13/17 0500 09/13/17 0846 09/14/17 0303  NA 138  --  139  K 3.6  --  3.7  CL 104  --  106  CO2 23  --  21*  GLUCOSE 178*  --  158*  BUN 12  --  10  CREATININE 1.08  --  1.01  CALCIUM 8.7*  --  8.1*  PROT  --  6.4*  --   ALBUMIN  --  3.3*  --   AST  --  37  --   ALT  --  23  --   ALKPHOS  --  63  --   BILITOT  --  0.9  --   GFRNONAA >60  --  >60  GFRAA >60  --  >60  ANIONGAP 11  --  12     Hematology Recent Labs  Lab 09/13/17 0500 09/14/17 0303  WBC 5.6 7.3  RBC 4.10* 3.41*  HGB 11.5* 9.6*  HCT 36.4* 30.9*  MCV 88.8 90.6  MCH 28.0  28.2  MCHC 31.6 31.1  RDW 12.4 12.8  PLT 224 232    Cardiac Enzymes Recent Labs  Lab 09/13/17 0846 09/13/17 1618 09/13/17 2250  TROPONINI 0.87* 59.55* 38.99*    Recent Labs  Lab 09/13/17 0527  TROPIPOC 0.55*     Radiology    Dg Chest 2 View  Result Date: 09/13/2017 CLINICAL DATA:  Chest pain EXAM: CHEST - 2 VIEW COMPARISON:  11/08/2016 FINDINGS: Mild cardiomegaly and pulmonary vascular congestion without overt edema. No pleural effusion or pneumothorax. No focal airspace consolidation. IMPRESSION: On cardiomegaly and pulmonary vascular congestion. Electronically Signed   By: Ulyses Jarred M.D.   On: 09/13/2017 05:54    Patient Profile     57 y.o. male admitted with acute myocardial infarction.  Underwent emergent cardiac catheterization yesterday.  Found to have an 85% distal LAD, 80% proximal followed by 80/90 mild right coronary artery, occluded circumflex.  Patient underwent PCI of the circumflex.  Assessment & Plan    1 status post myocardial infarction with PCI of left circumflex-continue aspirin, Brilinta, statin.  Add metoprolol 12.5 mg twice daily and advance as tolerated.  2 residual right coronary artery disease-patient will need PCI of RCA prior to discharge.  We will likely proceed tomorrow  morning.  3 hypertension-blood pressure is controlled this morning.  Discontinue IV nitroglycerin.  Add metoprolol as outlined.  Follow blood pressure and adjust regimen as needed.  4 hyperlipidemia-continue Lipitor 80 mg daily.  Check lipids and liver in 4 weeks.  5 diabetes mellitus-follow CBGs.  For questions or updates, please contact [place] Please consult [email or web address] for contact info under Cardiology/STEMI.      Signed, [name], MD  09/14/2017, 7:45 AM

## 2017-09-14 NOTE — Progress Notes (Signed)
Visited with this patient this evening regarding the Advanced Directive.  Patient states he would like to get this completed while in the hospital this visit.  He would like to name his spouse as his Press photographer and she will be here tomorrow (Monday) to help fill the paperwork out.  Please page Chaplain or call Spiritual Care office at [phone] for notary to come and complete paperwork.  We are happy to assist.      09/14/17 1949  Clinical Encounter Type  Visited With Patient  Visit Type Initial;Spiritual support  Spiritual Encounters  Spiritual Needs Literature (Advanced Directive)

## 2017-09-14 NOTE — Progress Notes (Signed)
  Echocardiogram 2D Echocardiogram has been performed.  [name] M 09/14/2017, 2:29 PM

## 2017-09-15 ENCOUNTER — Encounter (HOSPITAL_COMMUNITY)
Admission: EM | Disposition: A | Discharge status: Home / Self Care | Payer: Self-pay | Source: Home / Self Care | Attending: Cardiology

## 2017-09-15 ENCOUNTER — Encounter (HOSPITAL_COMMUNITY): Payer: Self-pay | Admitting: Cardiovascular Disease

## 2017-09-15 DIAGNOSIS — E1159 Type 2 diabetes mellitus with other circulatory complications: Secondary | ICD-10-CM

## 2017-09-15 DIAGNOSIS — I10 Essential (primary) hypertension: Secondary | ICD-10-CM

## 2017-09-15 HISTORY — PX: CORONARY ANGIOPLASTY WITH STENT PLACEMENT: SHX49

## 2017-09-15 HISTORY — PX: CORONARY STENT INTERVENTION: CATH118234

## 2017-09-15 HISTORY — PX: CORONARY ANGIOGRAPHY: CATH118303

## 2017-09-15 LAB — CBC
HCT: 30.8 % — ABNORMAL LOW (ref 39.0–52.0)
Hemoglobin: 9.7 g/dL — ABNORMAL LOW (ref 13.0–17.0)
MCH: 28.3 pg (ref 26.0–34.0)
MCHC: 31.5 g/dL (ref 30.0–36.0)
MCV: 89.8 fL (ref 78.0–100.0)
PLATELETS: 214 10*3/uL (ref 150–400)
RBC: 3.43 MIL/uL — AB (ref 4.22–5.81)
RDW: 12.6 % (ref 11.5–15.5)
WBC: 7.1 10*3/uL (ref 4.0–10.5)

## 2017-09-15 LAB — BASIC METABOLIC PANEL
ANION GAP: 10 (ref 5–15)
BUN: 11 mg/dL (ref 6–20)
CO2: 23 mmol/L (ref 22–32)
Calcium: 8.2 mg/dL — ABNORMAL LOW (ref 8.9–10.3)
Chloride: 106 mmol/L (ref 98–111)
Creatinine, Ser: 1.1 mg/dL (ref 0.61–1.24)
GFR calc Af Amer: >60 mL/min (ref >60)
Glucose, Bld: 110 mg/dL — ABNORMAL HIGH (ref 70–99)
POTASSIUM: 3.6 mmol/L (ref 3.5–5.1)
SODIUM: 139 mmol/L (ref 135–145)

## 2017-09-15 LAB — POCT ACTIVATED CLOTTING TIME
Activated Clotting Time: 252 seconds
Activated Clotting Time: 318 seconds

## 2017-09-15 LAB — GLUCOSE, CAPILLARY
GLUCOSE-CAPILLARY: 97 mg/dL (ref 70–99)
GLUCOSE-CAPILLARY: 101 mg/dL — AB (ref 70–99)
GLUCOSE-CAPILLARY: 82 mg/dL (ref 70–99)
GLUCOSE-CAPILLARY: 111 mg/dL — AB (ref 70–99)
Glucose-Capillary: 143 mg/dL — ABNORMAL HIGH (ref 70–99)

## 2017-09-15 SURGERY — CORONARY STENT INTERVENTION
Anesthesia: LOCAL

## 2017-09-15 MED ORDER — ACETAMINOPHEN 325 MG PO TABS: 650.0000 mg | ORAL_TABLET | ORAL | Status: DC | PRN

## 2017-09-15 MED ORDER — HEPARIN SODIUM (PORCINE) 1000 UNIT/ML IJ SOLN
INTRAMUSCULAR | Status: AC
  Filled 2017-09-15: qty 1

## 2017-09-15 MED ORDER — HEPARIN SODIUM (PORCINE) 1000 UNIT/ML IJ SOLN
INTRAMUSCULAR | Status: DC | PRN
  Administered 2017-09-15: 10000 [IU] via INTRAVENOUS
  Administered 2017-09-15: 4000 [IU] via INTRAVENOUS

## 2017-09-15 MED ORDER — HEPARIN (PORCINE) IN NACL 1000-0.9 UT/500ML-% IV SOLN
INTRAVENOUS | Status: DC | PRN
  Administered 2017-09-15 (×2): 500 mL

## 2017-09-15 MED ORDER — LIDOCAINE HCL (PF) 1 % IJ SOLN
INTRAMUSCULAR | Status: AC
  Filled 2017-09-15: qty 30

## 2017-09-15 MED ORDER — NITROGLYCERIN 1 MG/10 ML FOR IR/CATH LAB
INTRA_ARTERIAL | Status: DC | PRN
  Administered 2017-09-15: 200 ug via INTRACORONARY
  Administered 2017-09-15: 400 ug via INTRA_ARTERIAL

## 2017-09-15 MED ORDER — MIDAZOLAM HCL 2 MG/2ML IJ SOLN
INTRAMUSCULAR | Status: AC
  Filled 2017-09-15: qty 2

## 2017-09-15 MED ORDER — ONDANSETRON HCL 4 MG/2ML IJ SOLN
4.0000 mg | Freq: Four times a day (QID) | INTRAMUSCULAR | Status: DC | PRN
Start: 2017-09-15 — End: 2017-09-16

## 2017-09-15 MED ORDER — MIDAZOLAM HCL 2 MG/2ML IJ SOLN
INTRAMUSCULAR | Status: DC | PRN
  Administered 2017-09-15: 2 mg via INTRAVENOUS

## 2017-09-15 MED ORDER — SODIUM CHLORIDE 0.9% FLUSH: 3.0000 mL | INTRAVENOUS | Status: DC | PRN

## 2017-09-15 MED ORDER — VERAPAMIL HCL 2.5 MG/ML IV SOLN
INTRAVENOUS | Status: AC
  Filled 2017-09-15: qty 2

## 2017-09-15 MED ORDER — SODIUM CHLORIDE 0.9% FLUSH: 3.0000 mL | Freq: Two times a day (BID) | INTRAVENOUS | Status: DC

## 2017-09-15 MED ORDER — HEPARIN (PORCINE) IN NACL 1000-0.9 UT/500ML-% IV SOLN
INTRAVENOUS | Status: AC
  Filled 2017-09-15: qty 500

## 2017-09-15 MED ORDER — TICAGRELOR 90 MG PO TABS: 90.0000 mg | ORAL_TABLET | Freq: Two times a day (BID) | ORAL | Status: DC

## 2017-09-15 MED ORDER — ANGIOPLASTY BOOK
Freq: Once | Status: AC
  Administered 2017-09-15: 21:00:00
  Filled 2017-09-15: qty 1

## 2017-09-15 MED ORDER — ASPIRIN 81 MG PO CHEW: 81.0000 mg | CHEWABLE_TABLET | Freq: Every day | ORAL | Status: DC

## 2017-09-15 MED ORDER — THE SENSUOUS HEART BOOK
Freq: Once | Status: AC
  Administered 2017-09-15: 21:00:00
  Filled 2017-09-15: qty 1

## 2017-09-15 MED ORDER — SODIUM CHLORIDE 0.9 % IV SOLN
INTRAVENOUS | Status: AC
  Administered 2017-09-15: 16:00:00 via INTRAVENOUS

## 2017-09-15 MED ORDER — VERAPAMIL HCL 2.5 MG/ML IV SOLN
INTRAVENOUS | Status: DC | PRN
  Administered 2017-09-15: 10 mL via INTRA_ARTERIAL

## 2017-09-15 MED ORDER — LIDOCAINE HCL (PF) 1 % IJ SOLN
INTRAMUSCULAR | Status: DC | PRN
  Administered 2017-09-15: 2 mL via SUBCUTANEOUS

## 2017-09-15 MED ORDER — NITROGLYCERIN 1 MG/10 ML FOR IR/CATH LAB
INTRA_ARTERIAL | Status: AC
  Filled 2017-09-15: qty 10

## 2017-09-15 MED ORDER — FENTANYL CITRATE (PF) 100 MCG/2ML IJ SOLN
INTRAMUSCULAR | Status: DC | PRN
  Administered 2017-09-15: 25 ug via INTRAVENOUS

## 2017-09-15 MED ORDER — IOHEXOL 350 MG/ML SOLN
INTRAVENOUS | Status: DC | PRN
  Administered 2017-09-15: 80 mL via INTRA_ARTERIAL

## 2017-09-15 MED ORDER — SODIUM CHLORIDE 0.9 % IV SOLN: 250.0000 mL | INTRAVENOUS | Status: DC | PRN

## 2017-09-15 MED ORDER — FENTANYL CITRATE (PF) 100 MCG/2ML IJ SOLN
INTRAMUSCULAR | Status: AC
  Filled 2017-09-15: qty 2

## 2017-09-15 MED ORDER — HEART ATTACK BOUNCING BOOK
Freq: Once | Status: AC
  Administered 2017-09-15: 21:00:00
  Filled 2017-09-15: qty 1

## 2017-09-15 MED ORDER — LABETALOL HCL 5 MG/ML IV SOLN: 10.0000 mg | INTRAVENOUS | Status: AC | PRN

## 2017-09-15 MED ORDER — HYDRALAZINE HCL 20 MG/ML IJ SOLN: 5.0000 mg | INTRAMUSCULAR | Status: AC | PRN

## 2017-09-15 SURGICAL SUPPLY — 21 items
BALLN SAPPHIRE 2.25X20 (BALLOONS) ×2
BALLN SAPPHIRE ~~LOC~~ 3.0X12 (BALLOONS) ×2 IMPLANT
BALLN ~~LOC~~ EMERGE MR 3.0X20 (BALLOONS) ×2
BALLOON SAPPHIRE 2.25X20 (BALLOONS) ×1 IMPLANT
BALLOON ~~LOC~~ EMERGE MR 3.0X20 (BALLOONS) ×1 IMPLANT
CATH INFINITI 5 FR JL3.5 (CATHETERS) ×4 IMPLANT
CATH INFINITI 5FR JL4 (CATHETERS) ×2 IMPLANT
CATH LAUNCHER 6FR JR4 (CATHETERS) ×2 IMPLANT
DEVICE RAD COMP TR BAND LRG (VASCULAR PRODUCTS) ×2 IMPLANT
ELECT DEFIB PAD ADLT CADENCE (PAD) ×2 IMPLANT
GLIDESHEATH SLEND SS 6F .021 (SHEATH) ×2 IMPLANT
GUIDEWIRE INQWIRE 1.5J.035X260 (WIRE) ×1 IMPLANT
INQWIRE 1.5J .035X260CM (WIRE) ×2
KIT ENCORE 26 ADVANTAGE (KITS) ×2 IMPLANT
KIT HEART LEFT (KITS) ×2 IMPLANT
KIT HEMO VALVE WATCHDOG (MISCELLANEOUS) ×2 IMPLANT
PACK CARDIAC CATHETERIZATION (CUSTOM PROCEDURE TRAY) ×2 IMPLANT
STENT SYNERGY DES 2.5X38 (Permanent Stent) ×2 IMPLANT
TRANSDUCER W/STOPCOCK (MISCELLANEOUS) ×2 IMPLANT
TUBING CIL FLEX 10 FLL-RA (TUBING) ×2 IMPLANT
WIRE ASAHI FIELDER XT 190CM (WIRE) ×2 IMPLANT

## 2017-09-15 NOTE — Progress Notes (Signed)
   09/15/17 1200  Clinical Encounter Type  Visited With Patient and family together  Visit Type Follow-up  Referral From Family;Nurse  Consult/Referral To Chaplain   Followed up on request for HCPA.  Met with patient and his wife and completed the parts before the notary and witnesses.  Will complete this part today.  They have been married 30 years with 5 children and 15 grandchildren.  Patient stated he was feeling much better than Saturday, but indicated he will have another stint later today.  Will follow up when notary returns [place]

## 2017-09-15 NOTE — H&P (View-Only) (Signed)
Progress Note  Patient Name: [name] Date of Encounter: 09/15/2017  Primary Cardiologist: new, Dr. Stanford Breed  Subjective   No recurrent chest pain  Inpatient Medications    Scheduled Meds: . aspirin EC  81 mg Oral Daily  . atorvastatin  80 mg Oral q1800  . busPIRone  10 mg Oral BID  . citalopram  40 mg Oral Daily  . docusate sodium  100 mg Oral BID  . gabapentin  300 mg Oral TID  . glipiZIDE  5 mg Oral Daily  . insulin aspart  0-9 Units Subcutaneous TID WC  . lisinopril  10 mg Oral Daily  . memantine  10 mg Oral BID  . metoprolol tartrate  12.5 mg Oral BID  . modafinil  100 mg Oral Daily  . mupirocin ointment  1 application Nasal BID  . polyethylene glycol  17 g Oral Daily  . sodium chloride flush  3 mL Intravenous Q12H  . sodium chloride flush  3 mL Intravenous Q12H  . ticagrelor  90 mg Oral BID   Continuous Infusions: . sodium chloride Stopped (09/14/17 1924)  . sodium chloride    . sodium chloride    . sodium chloride 1 mL/kg/hr (09/15/17 0139)   PRN Meds: sodium chloride, sodium chloride, acetaminophen, diazepam, nitroGLYCERIN, ondansetron (ZOFRAN) IV, oxyCODONE, sodium chloride flush, sodium chloride flush, zolpidem   Vital Signs    Vitals:   09/15/17 0500 09/15/17 0600 09/15/17 0727 09/15/17 0800  BP: 130/79 (!) 146/95 140/85 139/89  Pulse: 66 75 73 87  Resp: (!) 24 (!) 28 (!) 27 (!) 27  Temp:   98.6 F (37 C)   TempSrc:   Oral   SpO2: 99% 98% 99% 99%  Weight:  112.1 kg    Height:        Intake/Output Summary (Last 24 hours) at 09/15/2017 0831 Last data filed at 09/15/2017 0600 Gross per 24 hour  Intake 1879.89 ml  Output 650 ml  Net 1229.89 ml    I/O since admission: [place] Weights   09/13/17 0511 09/14/17 0700 09/15/17 0600  Weight: 113.4 kg 111.3 kg 112.1 kg    Telemetry    Sinus at 75 - Personally Reviewed  ECG    ECG (independently read by me): NSR at 83, inferolateral STT changes  Physical Exam   BP 139/89    Pulse 87   Temp 98.6 F (37 C) (Oral)   Resp (!) 27   Ht 5\' 9"  (1.753 m)   Wt 112.1 kg   SpO2 99%   BMI 36.50 kg/m  General: Alert, oriented, no distress.  Skin: normal turgor, no rashes, warm and dry HEENT: Normocephalic, atraumatic. Pupils equal round and reactive to light; sclera anicteric; extraocular muscles intact;  Nose without nasal septal hypertrophy Mouth/Parynx benign; Mallinpatti scale 3 Neck: No JVD, no carotid bruits; normal carotid upstroke Lungs: clear to ausculatation and percussion; no wheezing or rales Chest wall: without tenderness to palpitation Heart: PMI not displaced, RRR, s1 s2 normal, 1/6 systolic murmur, no diastolic murmur, no rubs, gallops, thrills, or heaves Abdomen: soft, nontender; no hepatosplenomehaly, BS+; abdominal aorta nontender and not dilated by palpation. Back: no CVA tenderness Pulses 2+; R radial site stable Musculoskeletal: full range of motion, normal strength, no joint deformities Extremities: no clubbing cyanosis or edema, Homan's sign negative  Neurologic: grossly nonfocal; Cranial nerves grossly wnl Psychologic: Normal mood and affect   Labs    Chemistry Recent Labs  Lab 09/13/17 0500 09/13/17 0846 09/14/17 0303  09/15/17 0237  NA 138  --  139 139  K 3.6  --  3.7 3.6  CL 104  --  106 106  CO2 23  --  21* 23  GLUCOSE 178*  --  158* 110*  BUN 12  --  10 11  CREATININE 1.08  --  1.01 1.10  CALCIUM 8.7*  --  8.1* 8.2*  PROT  --  6.4*  --   --   ALBUMIN  --  3.3*  --   --   AST  --  37  --   --   ALT  --  23  --   --   ALKPHOS  --  63  --   --   BILITOT  --  0.9  --   --   GFRNONAA >60  --  >60 >60  GFRAA >60  --  >60 >60  ANIONGAP 11  --  12 10     Hematology Recent Labs  Lab 09/13/17 0500 09/14/17 0303 09/15/17 0237  WBC 5.6 7.3 7.1  RBC 4.10* 3.41* 3.43*  HGB 11.5* 9.6* 9.7*  HCT 36.4* 30.9* 30.8*  MCV 88.8 90.6 89.8  MCH 28.0 28.2 28.3  MCHC 31.6 31.1 31.5  RDW 12.4 12.8 12.6  PLT 224 232 214     Cardiac Enzymes Recent Labs  Lab 09/13/17 0846 09/13/17 1618 09/13/17 2250  TROPONINI 0.87* 59.55* 38.99*    Recent Labs  Lab 09/13/17 0527  TROPIPOC 0.55*     BNPNo results for input(s): BNP, PROBNP in the last 168 hours.   DDimer No results for input(s): DDIMER in the last 168 hours.   Lipid Panel     Component Value Date/Time   CHOL 137 09/14/2017 0303   TRIG 66 09/14/2017 0303   HDL 46 09/14/2017 0303   CHOLHDL 3.0 09/14/2017 0303   VLDL 13 09/14/2017 0303   LDLCALC 78 09/14/2017 0303    Radiology    No results found.  Cardiac Studies    Dist LAD lesion is 85% stenosed.  Prox RCA lesion is 80% stenosed.  Mid RCA-1 lesion is 80% stenosed.  Mid RCA-2 lesion is 90% stenosed.  Ost Cx to Prox Cx lesion is 100% stenosed.  Ost 1st Mrg lesion is 100% stenosed.  A stent was successfully placed.  Post intervention, there is a 0% residual stenosis.  Mid Cx lesion is 50% stenosed.  Post intervention, there is a 0% residual stenosis.  Post intervention, there is a 0% residual stenosis.   Acute coronary syndrome secondary to total proximal occlusion of a large left circumflex coronary artery.  Concomitant CAD with 85% distal LAD stenosis and significant mid RCA stenoses segmentally of 80%, 80%, and 90%. LVEDP 25 mm.  Successful percutaneous coronary intervention to the totally occluded circumflex and circumflex marginal 1 vessel treated with PTCA and stenting of the proximal to mid left circumflex vessel with ultimate insertion of a 3.0 x 38 mm Resolute DES stent postdilated to 3.25 mm, and PTCA of the totally occluded OM1 vessel with restoration of TIMI-3 flow and residual stenosis of 0 in a small caliber vessel.  RECOMMENDATION: Patient will be hydrated post procedure.  A 2D echo Doppler study will be ordered to assess LV function.  Post MI beta-blocker, ACE inhibition/ARB therapy, and high potency statin therapy will be implemented.  Patient will  need to undergo staged PCI to his segmental high-grade mid RCA stenoses prior to discharge.  Recommend outpatient sleep evaluation for probable obstructive  sleep apnea.  Recommend uninterrupted dual antiplatelet therapy with Aspirin 81mg  daily and Ticagrelor 90mg  twice daily for a minimum of 12 months (ACS - Class I recommendation).         ------------------------------------------------------------------- ECHO Study Conclusions  - Left ventricle: The cavity size was mildly dilated. Wall   thickness was normal. The estimated ejection fraction was 50%.   Basal to mid inferolateral and basal anterolateral severe   hypokinesis. Features are consistent with a pseudonormal left   ventricular filling pattern, with concomitant abnormal relaxation   and increased filling pressure (grade 2 diastolic dysfunction). - Aortic valve: There was no stenosis. - Mitral valve: There was trivial regurgitation. - Right ventricle: The cavity size was normal. Systolic function   was normal. - Pulmonary arteries: No complete TR doppler jet so unable to   estimate PA systolic pressure. - Inferior vena cava: The vessel was normal in size. The   respirophasic diameter changes were in the normal range (>= 50%),   consistent with normal central venous pressure.  Impressions:  - Mildly dilated LV with EF 50%. Wall motion abnormalities as noted   above. Normal RV size and systolic function. No significant   valvular abnormalities.   Patient Profile     57 y.o. male admitted with acute myocardial infarction and underwent emergent cardiac catheterization 09/13/17 demonstrating 85% distal LAD, 80% proximal followed by 80/90 mild right coronary artery, occluded proximal circumflex.  Patient underwent PCI of the circumflex  Assessment & Plan   1. Day 2 ACS 2/2 LCX occlusion with DES stent to LCX and PTCA of occluded OM. Pt troponin 59.55.  Now on lopressor, lisinopril, ASA, Brilinta, statin.   ECHO EF 50 %  with inferolateral hypokinesis.  2. Concomitant CAD: plan staged PCI to segmental mid RCA today, and probable med Rx to distal LAD  3. HTN:   BP now 139.89  4. HLD:  admission LDL 78, now on atorvastatin 80 mg  5. Type 2 DM:   6. Anemia: H/H decreased to 9.7/30.8  6. Suspect OSA; will need outpatient sleep study  7. Obesity: BMI 36.9  Discussed staged PCI today; currently on schedule today for this afternoon.   Signed, Troy Sine, MD, [facility] 09/15/2017, 8:31 AM

## 2017-09-15 NOTE — Interval H&P Note (Signed)
Cath Lab Visit (complete for each Cath Lab visit)  Clinical Evaluation Leading to the Procedure:   ACS: Yes.    Non-ACS:    Anginal Classification: CCS IV  Anti-ischemic medical therapy: Minimal Therapy (1 class of medications)  Non-Invasive Test Results: No non-invasive testing performed  Prior CABG: No previous CABG      History and Physical Interval Note:  09/15/2017 1:35 PM  [name]  has presented today for surgery, with the diagnosis of NSTEMI  The various methods of treatment have been discussed with the patient and family. After consideration of risks, benefits and other options for treatment, the patient has consented to  Procedure(s): CORONARY STENT INTERVENTION (N/A) as a surgical intervention .  The patient's history has been reviewed, patient examined, no change in status, stable for surgery.  I have reviewed the patient's chart and labs.  Questions were answered to the patient's satisfaction.     [name]

## 2017-09-15 NOTE — Progress Notes (Signed)
CARDIAC REHAB PHASE I   MI education began with pt and wife. MI booklet given and reviewed. Pt and wife educated on importance of ASA, Brilinta, statin and NTG. Pt given stent card along with heart healthy and diabetic diets. Reviewed restrictions. Pt for staged PCI later today. Will continue to follow.  [id number] Rufina Falco, RN BSN 09/15/2017 11:30 AM

## 2017-09-15 NOTE — Care Management (Signed)
09-15-17  BENEFITS CHECK :   # 4.   S/W TRAVIS  @ [employer] # [phone]  BRILINTA  90 MG BID COVER- YES CO-PAY- $ 45.00 TIER- 3 DRUG PRIOR APPROVAL- NO  PREFERRED PHARMACY : YES [facility], [facility] AND [facility]

## 2017-09-15 NOTE — Progress Notes (Signed)
Progress Note  Patient Name: [name] Date of Encounter: 09/15/2017  Primary Cardiologist: new, Dr. Stanford Breed  Subjective   No recurrent chest pain  Inpatient Medications    Scheduled Meds: . aspirin EC  81 mg Oral Daily  . atorvastatin  80 mg Oral q1800  . busPIRone  10 mg Oral BID  . citalopram  40 mg Oral Daily  . docusate sodium  100 mg Oral BID  . gabapentin  300 mg Oral TID  . glipiZIDE  5 mg Oral Daily  . insulin aspart  0-9 Units Subcutaneous TID WC  . lisinopril  10 mg Oral Daily  . memantine  10 mg Oral BID  . metoprolol tartrate  12.5 mg Oral BID  . modafinil  100 mg Oral Daily  . mupirocin ointment  1 application Nasal BID  . polyethylene glycol  17 g Oral Daily  . sodium chloride flush  3 mL Intravenous Q12H  . sodium chloride flush  3 mL Intravenous Q12H  . ticagrelor  90 mg Oral BID   Continuous Infusions: . sodium chloride Stopped (09/14/17 1924)  . sodium chloride    . sodium chloride    . sodium chloride 1 mL/kg/hr (09/15/17 0139)   PRN Meds: sodium chloride, sodium chloride, acetaminophen, diazepam, nitroGLYCERIN, ondansetron (ZOFRAN) IV, oxyCODONE, sodium chloride flush, sodium chloride flush, zolpidem   Vital Signs    Vitals:   09/15/17 0500 09/15/17 0600 09/15/17 0727 09/15/17 0800  BP: 130/79 (!) 146/95 140/85 139/89  Pulse: 66 75 73 87  Resp: (!) 24 (!) 28 (!) 27 (!) 27  Temp:   98.6 F (37 C)   TempSrc:   Oral   SpO2: 99% 98% 99% 99%  Weight:  112.1 kg    Height:        Intake/Output Summary (Last 24 hours) at 09/15/2017 0831 Last data filed at 09/15/2017 0600 Gross per 24 hour  Intake 1879.89 ml  Output 650 ml  Net 1229.89 ml    I/O since admission: +3447  Filed Weights   09/13/17 0511 09/14/17 0700 09/15/17 0600  Weight: 113.4 kg 111.3 kg 112.1 kg    Telemetry    Sinus at 75 - Personally Reviewed  ECG    ECG (independently read by me): NSR at 83, inferolateral STT changes  Physical Exam   BP 139/89    Pulse 87   Temp 98.6 F (37 C) (Oral)   Resp (!) 27   Ht 5\' 9"  (1.753 m)   Wt 112.1 kg   SpO2 99%   BMI 36.50 kg/m  General: Alert, oriented, no distress.  Skin: normal turgor, no rashes, warm and dry HEENT: Normocephalic, atraumatic. Pupils equal round and reactive to light; sclera anicteric; extraocular muscles intact;  Nose without nasal septal hypertrophy Mouth/Parynx benign; Mallinpatti scale 3 Neck: No JVD, no carotid bruits; normal carotid upstroke Lungs: clear to ausculatation and percussion; no wheezing or rales Chest wall: without tenderness to palpitation Heart: PMI not displaced, RRR, s1 s2 normal, 1/6 systolic murmur, no diastolic murmur, no rubs, gallops, thrills, or heaves Abdomen: soft, nontender; no hepatosplenomehaly, BS+; abdominal aorta nontender and not dilated by palpation. Back: no CVA tenderness Pulses 2+; R radial site stable Musculoskeletal: full range of motion, normal strength, no joint deformities Extremities: no clubbing cyanosis or edema, Homan's sign negative  Neurologic: grossly nonfocal; Cranial nerves grossly wnl Psychologic: Normal mood and affect   Labs    Chemistry Recent Labs  Lab 09/13/17 0500 09/13/17 0846 09/14/17 0303  09/15/17 0237  NA 138  --  139 139  K 3.6  --  3.7 3.6  CL 104  --  106 106  CO2 23  --  21* 23  GLUCOSE 178*  --  158* 110*  BUN 12  --  10 11  CREATININE 1.08  --  1.01 1.10  CALCIUM 8.7*  --  8.1* 8.2*  PROT  --  6.4*  --   --   ALBUMIN  --  3.3*  --   --   AST  --  37  --   --   ALT  --  23  --   --   ALKPHOS  --  63  --   --   BILITOT  --  0.9  --   --   GFRNONAA >60  --  >60 >60  GFRAA >60  --  >60 >60  ANIONGAP 11  --  12 10     Hematology Recent Labs  Lab 09/13/17 0500 09/14/17 0303 09/15/17 0237  WBC 5.6 7.3 7.1  RBC 4.10* 3.41* 3.43*  HGB 11.5* 9.6* 9.7*  HCT 36.4* 30.9* 30.8*  MCV 88.8 90.6 89.8  MCH 28.0 28.2 28.3  MCHC 31.6 31.1 31.5  RDW 12.4 12.8 12.6  PLT 224 232 214     Cardiac Enzymes Recent Labs  Lab 09/13/17 0846 09/13/17 1618 09/13/17 2250  TROPONINI 0.87* 59.55* 38.99*    Recent Labs  Lab 09/13/17 0527  TROPIPOC 0.55*     BNPNo results for input(s): BNP, PROBNP in the last 168 hours.   DDimer No results for input(s): DDIMER in the last 168 hours.   Lipid Panel     Component Value Date/Time   CHOL 137 09/14/2017 0303   TRIG 66 09/14/2017 0303   HDL 46 09/14/2017 0303   CHOLHDL 3.0 09/14/2017 0303   VLDL 13 09/14/2017 0303   LDLCALC 78 09/14/2017 0303    Radiology    No results found.  Cardiac Studies    Dist LAD lesion is 85% stenosed.  Prox RCA lesion is 80% stenosed.  Mid RCA-1 lesion is 80% stenosed.  Mid RCA-2 lesion is 90% stenosed.  Ost Cx to Prox Cx lesion is 100% stenosed.  Ost 1st Mrg lesion is 100% stenosed.  A stent was successfully placed.  Post intervention, there is a 0% residual stenosis.  Mid Cx lesion is 50% stenosed.  Post intervention, there is a 0% residual stenosis.  Post intervention, there is a 0% residual stenosis.   Acute coronary syndrome secondary to total proximal occlusion of a large left circumflex coronary artery.  Concomitant CAD with 85% distal LAD stenosis and significant mid RCA stenoses segmentally of 80%, 80%, and 90%. LVEDP 25 mm.  Successful percutaneous coronary intervention to the totally occluded circumflex and circumflex marginal 1 vessel treated with PTCA and stenting of the proximal to mid left circumflex vessel with ultimate insertion of a 3.0 x 38 mm Resolute DES stent postdilated to 3.25 mm, and PTCA of the totally occluded OM1 vessel with restoration of TIMI-3 flow and residual stenosis of 0 in a small caliber vessel.  RECOMMENDATION: Patient will be hydrated post procedure.  A 2D echo Doppler study will be ordered to assess LV function.  Post MI beta-blocker, ACE inhibition/ARB therapy, and high potency statin therapy will be implemented.  Patient will  need to undergo staged PCI to his segmental high-grade mid RCA stenoses prior to discharge.  Recommend outpatient sleep evaluation for probable obstructive  sleep apnea.  Recommend uninterrupted dual antiplatelet therapy with Aspirin 81mg  daily and Ticagrelor 90mg  twice daily for a minimum of 12 months (ACS - Class I recommendation).         ------------------------------------------------------------------- ECHO Study Conclusions  - Left ventricle: The cavity size was mildly dilated. Wall   thickness was normal. The estimated ejection fraction was 50%.   Basal to mid inferolateral and basal anterolateral severe   hypokinesis. Features are consistent with a pseudonormal left   ventricular filling pattern, with concomitant abnormal relaxation   and increased filling pressure (grade 2 diastolic dysfunction). - Aortic valve: There was no stenosis. - Mitral valve: There was trivial regurgitation. - Right ventricle: The cavity size was normal. Systolic function   was normal. - Pulmonary arteries: No complete TR doppler jet so unable to   estimate PA systolic pressure. - Inferior vena cava: The vessel was normal in size. The   respirophasic diameter changes were in the normal range (>= 50%),   consistent with normal central venous pressure.  Impressions:  - Mildly dilated LV with EF 50%. Wall motion abnormalities as noted   above. Normal RV size and systolic function. No significant   valvular abnormalities.   Patient Profile     57 y.o. male admitted with acute myocardial infarction and underwent emergent cardiac catheterization 09/13/17 demonstrating 85% distal LAD, 80% proximal followed by 80/90 mild right coronary artery, occluded proximal circumflex.  Patient underwent PCI of the circumflex  Assessment & Plan   1. Day 2 ACS 2/2 LCX occlusion with DES stent to LCX and PTCA of occluded OM. Pt troponin 59.55.  Now on lopressor, lisinopril, ASA, Brilinta, statin.   ECHO EF 50 %  with inferolateral hypokinesis.  2. Concomitant CAD: plan staged PCI to segmental mid RCA today, and probable med Rx to distal LAD  3. HTN:   BP now 139.89  4. HLD:  admission LDL 78, now on atorvastatin 80 mg  5. Type 2 DM:   6. Anemia: H/H decreased to 9.7/30.8  6. Suspect OSA; will need outpatient sleep study  7. Obesity: BMI 36.9  Discussed staged PCI today; currently on schedule today for this afternoon.   Signed, Troy Sine, MD, [facility] 09/15/2017, 8:31 AM

## 2017-09-16 ENCOUNTER — Encounter (HOSPITAL_COMMUNITY): Payer: Self-pay | Admitting: Interventional Cardiology

## 2017-09-16 ENCOUNTER — Telehealth: Payer: Self-pay | Admitting: Cardiology

## 2017-09-16 LAB — BASIC METABOLIC PANEL WITH GFR
Anion gap: 9 (ref 5–15)
BUN: 7 mg/dL (ref 6–20)
CO2: 24 mmol/L (ref 22–32)
Calcium: 8.2 mg/dL — ABNORMAL LOW (ref 8.9–10.3)
Chloride: 106 mmol/L (ref 98–111)
Creatinine, Ser: 1.06 mg/dL (ref 0.61–1.24)
GFR calc Af Amer: >60 mL/min
GFR calc non Af Amer: >60 mL/min
Glucose, Bld: 133 mg/dL — ABNORMAL HIGH (ref 70–99)
Potassium: 3.6 mmol/L (ref 3.5–5.1)
Sodium: 139 mmol/L (ref 135–145)

## 2017-09-16 LAB — CBC
HCT: 30.4 % — ABNORMAL LOW (ref 39.0–52.0)
Hemoglobin: 9.5 g/dL — ABNORMAL LOW (ref 13.0–17.0)
MCH: 28 pg (ref 26.0–34.0)
MCHC: 31.3 g/dL (ref 30.0–36.0)
MCV: 89.7 fL (ref 78.0–100.0)
Platelets: 214 K/uL (ref 150–400)
RBC: 3.39 MIL/uL — ABNORMAL LOW (ref 4.22–5.81)
RDW: 12.8 % (ref 11.5–15.5)
WBC: 6.1 K/uL (ref 4.0–10.5)

## 2017-09-16 LAB — GLUCOSE, CAPILLARY
Glucose-Capillary: 137 mg/dL — ABNORMAL HIGH (ref 70–99)
Glucose-Capillary: 155 mg/dL — ABNORMAL HIGH (ref 70–99)

## 2017-09-16 MED ORDER — BUSPIRONE HCL 10 MG PO TABS: 10.0000 mg | ORAL_TABLET | Freq: Every day | ORAL | Status: DC

## 2017-09-16 MED ORDER — ATORVASTATIN CALCIUM 80 MG PO TABS: 80.0000 mg | ORAL_TABLET | Freq: Every day | ORAL | 2 refills | Status: DC

## 2017-09-16 MED ORDER — TICAGRELOR 90 MG PO TABS: 90.0000 mg | ORAL_TABLET | Freq: Two times a day (BID) | ORAL | 2 refills | Status: DC

## 2017-09-16 MED ORDER — GABAPENTIN 300 MG PO CAPS: 300.0000 mg | ORAL_CAPSULE | Freq: Two times a day (BID) | ORAL | 11 refills | Status: DC

## 2017-09-16 MED ORDER — METOPROLOL TARTRATE 25 MG PO TABS
25.0000 mg | ORAL_TABLET | Freq: Two times a day (BID) | ORAL | Status: DC
  Administered 2017-09-16: 11:00:00 25 mg via ORAL
  Filled 2017-09-16: qty 1

## 2017-09-16 MED ORDER — NITROGLYCERIN 0.4 MG SL SUBL: 0.4000 mg | SUBLINGUAL_TABLET | SUBLINGUAL | 2 refills | Status: DC | PRN

## 2017-09-16 MED ORDER — STUDY - AEGIS II STUDY - PLACEBO OR CSL112 (PI-HILTY)
170.0000 mL | INTRAVENOUS | Status: DC
  Administered 2017-09-16: 170 mL via INTRAVENOUS
  Filled 2017-09-16: qty 170

## 2017-09-16 MED ORDER — METOPROLOL TARTRATE 25 MG PO TABS: 25.0000 mg | ORAL_TABLET | Freq: Two times a day (BID) | ORAL | 2 refills | Status: DC

## 2017-09-16 MED ORDER — METOPROLOL TARTRATE 25 MG PO TABS: 12.5000 mg | ORAL_TABLET | Freq: Two times a day (BID) | ORAL | 2 refills | Status: DC

## 2017-09-16 NOTE — Care Management Note (Addendum)
Case Management Note  Patient Details  Name: [name] MRN: [id number] Date of Birth: 1961/01/24  Subjective/Objective:    From home, s/p stent intervention, will be on brilinta, has co pay of 45.00 , gave  patient the 30 day free coupon, his pharmacy , [facility] on [address] has brilinta in stock.                  Action/Plan: DC home when ready.  Expected Discharge Date:                  Expected Discharge Plan:  Home/Self Care  In-House Referral:     Discharge planning Services  CM Consult, Medication Assistance  Post Acute Care Choice:    Choice offered to:     DME Arranged:    DME Agency:     HH Arranged:    HH Agency:     Status of Service:  Completed, signed off  If discussed at [employer] of Stay Meetings, dates discussed:    Additional Comments:  Zenon Mayo, RN 09/16/2017, 7:39 AM

## 2017-09-16 NOTE — Progress Notes (Signed)
CARDIAC REHAB PHASE I   PRE:  Rate/Rhythm: 76 SR  BP:  Sitting: 142/82      SaO2: 98 RA  MODE:  Ambulation: 600 ft    89 peak HR  POST:  Rate/Rhythm: 74 SR  BP:  Sitting: 143/87    SaO2: 98 RA   Pt ambulated 659ft in hallway independently with steady gait. Pt c/o slight SOB, but states it is much improved than prior to stenting. Pt denies CP. Pt able to articulate importance of Brilinta, and when to take. Pt very receptive to information, and eager to learn. Reviewed exercise guidelines with pt. Will refer to CRP II Colp Rufina Falco, RN BSN 09/16/2017 8:56 AM

## 2017-09-16 NOTE — Progress Notes (Signed)
Chaplain notarized document for patient and provided a copy to the family.  Patient waiting to be discharged and is wanting to go home.  Copy given to his nurse who stated she would place it on the chart as she was professionally giving discharge orders and original copy given to patient.    09/16/17 1323  Clinical Encounter Type  Visited With Patient;Family;Health care provider  Visit Type Initial;Spiritual support  Spiritual Encounters  Spiritual Needs Literature (Advanced Directive)

## 2017-09-16 NOTE — Discharge Summary (Addendum)
Discharge Summary    Patient ID: [name],  MRN: [id number], DOB/AGE: [birth year] 57 y.o.  Admit date: 09/13/2017 Discharge date: 09/16/2017  Primary Care Provider: [name] Primary Cardiologist: Dr. Stanford Breed  Discharge Diagnoses    Active Problems:   Essential hypertension   DM (diabetes mellitus) ([place])   Depression   NSTEMI (non-ST elevated myocardial infarction) ([place])   Hyperlipidemia   Chronic back pain   Mild anemia   ST elevation myocardial infarction involving left circumflex coronary artery (HCC)   Allergies Allergies  Allergen Reactions  . Cephalexin Hives and Swelling    Diagnostic Studies/Procedures    Cath: 09/13/17   Dist LAD lesion is 85% stenosed.  Prox RCA lesion is 80% stenosed.  Mid RCA-1 lesion is 80% stenosed.  Mid RCA-2 lesion is 90% stenosed.  Ost Cx to Prox Cx lesion is 100% stenosed.  Ost 1st Mrg lesion is 100% stenosed.  A stent was successfully placed.  Post intervention, there is a 0% residual stenosis.  Mid Cx lesion is 50% stenosed.  Post intervention, there is a 0% residual stenosis.  Post intervention, there is a 0% residual stenosis.   Acute coronary syndrome secondary to total proximal occlusion of a large left circumflex coronary artery.  Concomitant CAD with 85% distal LAD stenosis and significant mid RCA stenoses segmentally of 80%, 80%, and 90%. LVEDP 25 mm.  Successful percutaneous coronary intervention to the totally occluded circumflex and circumflex marginal 1 vessel treated with PTCA and stenting of the proximal to mid left circumflex vessel with ultimate insertion of a 3.0 x 38 mm Resolute DES stent postdilated to 3.25 mm, and PTCA of the totally occluded OM1 vessel with restoration of TIMI-3 flow and residual stenosis of 0 in a small caliber vessel.  RECOMMENDATION: Patient will be hydrated post procedure.  A 2D echo Doppler study will be ordered to assess LV function.  Post MI beta-blocker, ACE  inhibition/ARB therapy, and high potency statin therapy will be implemented.  Patient will need to undergo staged PCI to his segmental high-grade mid RCA stenoses prior to discharge.  Recommend outpatient sleep evaluation for probable obstructive sleep apnea.  Recommend uninterrupted dual antiplatelet therapy with Aspirin 81mg  daily and Ticagrelor 90mg  twice daily for a minimum of 12 months (ACS - Class I recommendation).  TTE: 09/14/17  Study Conclusions  - Left ventricle: The cavity size was mildly dilated. Wall   thickness was normal. The estimated ejection fraction was 50%.   Basal to mid inferolateral and basal anterolateral severe   hypokinesis. Features are consistent with a pseudonormal left   ventricular filling pattern, with concomitant abnormal relaxation   and increased filling pressure (grade 2 diastolic dysfunction). - Aortic valve: There was no stenosis. - Mitral valve: There was trivial regurgitation. - Right ventricle: The cavity size was normal. Systolic function   was normal. - Pulmonary arteries: No complete TR doppler jet so unable to   estimate PA systolic pressure. - Inferior vena cava: The vessel was normal in size. The   respirophasic diameter changes were in the normal range (>= 50%),   consistent with normal central venous pressure.  Impressions:  - Mildly dilated LV with EF 50%. Wall motion abnormalities as noted   above. Normal RV size and systolic function. No significant   valvular abnormalities.   Cath: 09/15/17   Dist LAD lesion is 85% stenosed.  Non-stenotic Ost 1st Mrg lesion was previously treated.  Previously placed Ost Cx to Prox Cx stent (unknown  type) is widely patent.  Previously placed Mid Cx stent (unknown type) is widely patent.  Prox RCA lesion is 80% stenosed.  Mid RCA-1 lesion is 80% stenosed.  Mid RCA-2 lesion is 90% stenosed.  A drug-eluting stent was successfully placed using a STENT SYNERGY DES 2.5X38 which covered  all of the lesions.  Post intervention, there is a 0% residual stenosis in all fo the areas.   Recommend uninterrupted dual antiplatelet therapy with Aspirin 81mg  daily and Ticagrelor 90mg  twice daily for a minimum of 12 months (ACS - Class I recommendation).   Continue aggressive secondary prevention.   _____________   History of Present Illness     57 y.o. male with no prior cardiac hx but HTN, HLD, DM, prostate CA s/p prostate surgery, ED, former tobacco abuse, chronic back pain on opiates, carpal tunnel surgery, DVT 2 yrs ago after prostate surgery (tx with blood thinners, no longer on) who presented to [facility] with chest pain. He reported overnight he awoke with substernal chest pain. He ambulated to bathroom but due to severe persistent chest pain with bilateral arm pain, SOB, nausea and vomiting he asked his wife to call 911 immediately. They gave him 4 baby ASA and SL with initial improvement in CP but this recurred in the ER to ongoing 8/10 pain despite morphine, IV heparin and IV NTG. He was also diaphoretic. ER course notable for HTN up to 156/100, HR 70s-80s, glucose 178, troponin 0.55, Hgb 11.5, CXR shows cardiomegaly and pulm vascular congestion without edema. No LEE or orthopnea. No other recent unusual sx. EKG showed diffuse ST depression and decision made to take for emergent cardiac cath.   Hospital Course     Underwent cath noted above with successful percutaneous coronary intervention to the totally occluded circumflex and circumflex marginal 1 vessel treated with PTCA and stenting of the proximal to mid left circumflex vessel. Troponin peaked at 59.55. Noted to have residual disease in the RCA with plans for staged intervention. Placed on DAPT with ASA/Brilinta. LDL noted at 78, and placed on high dose statin. Underwent staged intervention to the the RCA with PCI/DES x1. No recurrent chest pain. Follow up echo showed EF of 50-55% with basal to mid inferolateral and basal  anterolateral severe hypokinesis. Worked well with cardiac rehab. Of note he agreed to participate in the [facility]eg[facility] trial.    General: Well developed, well nourished, male appearing in no acute distress. Head: Normocephalic, atraumatic.  Neck: Supple without bruits, JVD. Lungs:  Resp regular and unlabored, CTA. Heart: RRR, S1, S2, no murmur; no rub. Abdomen: Soft, non-tender, non-distended with normoactive bowel sounds. No hepatomegaly. No rebound/guarding. No obvious abdominal masses. Extremities: No clubbing, cyanosis, edema. Distal pedal pulses are 2+ bilaterally. R radial cath site stable without bruising or hematoma Neuro: Alert and oriented X 3. Moves all extremities spontaneously. Psych: Normal affect.  Reuven Braver was seen by Dr. Claiborne Billings and determined stable for discharge home. Follow up in the office has been arranged. Medications are listed below.   _____________  Discharge Vitals Blood pressure (!) 145/97, pulse 63, temperature 98.2 F (36.8 C), resp. rate (!) 28, height 5\' 9"  (1.753 m), weight 112.8 kg, SpO2 97 %.  Filed Weights   09/14/17 0700 09/15/17 0600 09/16/17 0630  Weight: 111.3 kg 112.1 kg 112.8 kg    Labs & Radiologic Studies    CBC Recent Labs    09/15/17 0237 09/16/17 0450  WBC 7.1 6.1  HGB 9.7* 9.5*  HCT 30.8* 30.4*  MCV 89.8 89.7  PLT 214 694   Basic Metabolic Panel Recent Labs    09/15/17 0237 09/16/17 0450  NA 139 139  K 3.6 3.6  CL 106 106  CO2 23 24  GLUCOSE 110* 133*  BUN 11 7  CREATININE 1.10 1.06  CALCIUM 8.2* 8.2*   Liver Function Tests No results for input(s): AST, ALT, ALKPHOS, BILITOT, PROT, ALBUMIN in the last 72 hours. No results for input(s): LIPASE, AMYLASE in the last 72 hours. Cardiac Enzymes Recent Labs    09/13/17 1618 09/13/17 2250  TROPONINI 59.55* 38.99*   BNP Invalid input(s): POCBNP D-Dimer No results for input(s): DDIMER in the last 72 hours. Hemoglobin A1C No results for input(s): HGBA1C in the last 72  hours. Fasting Lipid Panel Recent Labs    09/14/17 0303  CHOL 137  HDL 46  LDLCALC 78  TRIG 66  CHOLHDL 3.0   Thyroid Function Tests No results for input(s): TSH, T4TOTAL, T3FREE, THYROIDAB in the last 72 hours.  Invalid input(s): FREET3 _____________  Dg Chest 2 View  Result Date: 09/13/2017 CLINICAL DATA:  Chest pain EXAM: CHEST - 2 VIEW COMPARISON:  11/08/2016 FINDINGS: Mild cardiomegaly and pulmonary vascular congestion without overt edema. No pleural effusion or pneumothorax. No focal airspace consolidation. IMPRESSION: On cardiomegaly and pulmonary vascular congestion. Electronically Signed   By: Ulyses Jarred M.D.   On: 09/13/2017 05:54   Disposition   Pt is being discharged home today in good condition.  Follow-up Plans & Appointments    Follow-up Information    Erlene Quan, PA-C Follow up on 10/01/2017.   Specialties:  Cardiology, Radiology Why:  at 8:30am for your follow up appt.  Contact information: [place] [address] [place] [address] [phone]          Discharge Instructions    AMB Referral to Cardiac Rehabilitation - Phase II   Complete by:  As directed    Diagnosis:   STEMI Coronary Stents     AMB Referral to Cardiac Rehabilitation - Phase II   Complete by:  As directed    Diagnosis:   STEMI Coronary Stents     Amb Referral to Cardiac Rehabilitation   Complete by:  As directed    Diagnosis:   Coronary Stents NSTEMI     Call MD for:  redness, tenderness, or signs of infection (pain, swelling, redness, odor or green/yellow discharge around incision site)   Complete by:  As directed    Diet - low sodium heart healthy   Complete by:  As directed    Discharge instructions   Complete by:  As directed    Radial Site Care Refer to this sheet in the next few weeks. These instructions provide you with information on caring for yourself after your procedure. Your caregiver may also give you more specific instructions. Your treatment  has been planned according to current medical practices, but problems sometimes occur. Call your caregiver if you have any problems or questions after your procedure. HOME CARE INSTRUCTIONS You may shower the day after the procedure.Remove the bandage (dressing) and gently wash the site with plain soap and water.Gently pat the site dry.  Do not apply powder or lotion to the site.  Do not submerge the affected site in water for 3 to 5 days.  Inspect the site at least twice daily.  Do not flex or bend the affected arm for 24 hours.  No lifting over 5 pounds (2.3 kg) for 5 days after  your procedure.  Do not drive home if you are discharged the same day of the procedure. Have someone else drive you.  You may drive 24 hours after the procedure unless otherwise instructed by your caregiver.  What to expect: Any bruising will usually fade within 1 to 2 weeks.  Blood that collects in the tissue (hematoma) may be painful to the touch. It should usually decrease in size and tenderness within 1 to 2 weeks.  SEEK IMMEDIATE MEDICAL CARE IF: You have unusual pain at the radial site.  You have redness, warmth, swelling, or pain at the radial site.  You have drainage (other than a small amount of blood on the dressing).  You have chills.  You have a fever or persistent symptoms for more than 72 hours.  You have a fever and your symptoms suddenly get worse.  Your arm becomes pale, cool, tingly, or numb.  You have heavy bleeding from the site. Hold pressure on the site.   PLEASE DO NOT MISS ANY DOSES OF YOUR BRILINTA!!!!! Also keep a log of you blood pressures and bring back to your follow up appt. Please call the office with any questions.   Patients taking blood thinners should generally stay away from medicines like ibuprofen, Advil, Motrin, naproxen, and Aleve due to risk of stomach bleeding. You may take Tylenol as directed or talk to your primary doctor about alternatives.   Increase activity slowly    Complete by:  As directed        Discharge Medications     Medication List    STOP taking these medications   diazepam 5 MG tablet Commonly known as:  VALIUM   HYDROcodone-acetaminophen 5-325 MG tablet Commonly known as:  NORCO/VICODIN   meloxicam 15 MG tablet Commonly known as:  MOBIC   nabumetone 500 MG tablet Commonly known as:  RELAFEN   naproxen 500 MG tablet Commonly known as:  NAPROSYN   nitroGLYCERIN 2 % Oint ointment Commonly known as:  NITROGLYN Replaced by:  nitroGLYCERIN 0.4 MG SL tablet   oxyCODONE-acetaminophen 5-325 MG tablet Commonly known as:  PERCOCET/ROXICET   predniSONE 20 MG tablet Commonly known as:  DELTASONE   Vitamin D (Ergocalciferol) 50000 units Caps capsule Commonly known as:  DRISDOL     TAKE these medications   ANUCORT-HC 25 MG suppository Generic drug:  hydrocortisone Place 1 suppository rectally daily as needed for hemorrhoids or itching.   aspirin EC 81 MG tablet Take 81 mg by mouth daily.   atorvastatin 80 MG tablet Commonly known as:  LIPITOR Take 1 tablet (80 mg total) by mouth daily at 6 PM.   busPIRone 10 MG tablet Commonly known as:  BUSPAR Take 10 mg by mouth 2 (two) times daily.   citalopram 40 MG tablet Commonly known as:  CELEXA Take 40 mg by mouth daily.   docusate sodium 100 MG capsule Commonly known as:  COLACE Take 100 mg by mouth 2 (two) times daily.   DULoxetine 20 MG capsule Commonly known as:  CYMBALTA Take 2 capsules (40 mg total) by mouth daily.   gabapentin 300 MG capsule Commonly known as:  NEURONTIN Take 1 capsule (300 mg total) by mouth at bedtime. What changed:  when to take this   glipiZIDE 5 MG 24 hr tablet Commonly known as:  GLUCOTROL XL Take 5 mg by mouth daily.   lisinopril 10 MG tablet Commonly known as:  PRINIVIL,ZESTRIL Take 1 tablet (10 mg total) by mouth daily.   memantine  10 MG tablet Commonly known as:  NAMENDA Take 10 mg by mouth 2 (two) times daily.     metFORMIN 500 MG tablet Commonly known as:  GLUCOPHAGE Take 1 tablet (500 mg total) by mouth 2 (two) times daily with a meal.   metoprolol tartrate 25 MG tablet Commonly known as:  LOPRESSOR Take 1 tablet (25 mg total) by mouth 2 (two) times daily.   modafinil 100 MG tablet Commonly known as:  PROVIGIL Take 100 mg by mouth daily.   mupirocin ointment 2 % Commonly known as:  BACTROBAN Place 1 application into the nose 2 (two) times daily.   nitroGLYCERIN 0.4 MG SL tablet Commonly known as:  NITROSTAT Place 1 tablet (0.4 mg total) under the tongue every 5 (five) minutes x 3 doses as needed for chest pain. Replaces:  nitroGLYCERIN 2 % Oint ointment   oxyCODONE 5 MG immediate release tablet Commonly known as:  Oxy IR/ROXICODONE Take 5 mg by mouth every 8 (eight) hours as needed for severe pain.   polyethylene glycol packet Commonly known as:  MIRALAX / GLYCOLAX Take 17 g by mouth daily. What changed:  Another medication with the same name was removed. Continue taking this medication, and follow the directions you see here.   ticagrelor 90 MG Tabs tablet Commonly known as:  BRILINTA Take 1 tablet (90 mg total) by mouth 2 (two) times daily.   VIAGRA 100 MG tablet Generic drug:  sildenafil Take 100 mg by mouth daily as needed for erectile dysfunction.       Acute coronary syndrome (MI, NSTEMI, STEMI, etc) this admission?: Yes.     AHA/ACC Clinical Performance & Quality Measures: 1. Aspirin prescribed? - Yes 2. ADP Receptor Inhibitor (Plavix/Clopidogrel, Brilinta/Ticagrelor or Effient/Prasugrel) prescribed (includes medically managed patients)? - Yes 3. Beta Blocker prescribed? - Yes 4. High Intensity Statin (Lipitor 40-80mg  or Crestor 20-40mg ) prescribed? - Yes 5. EF assessed during THIS hospitalization? - Yes 6. For EF <40%, was ACEI/ARB prescribed? - Yes 7. For EF <40%, Aldosterone Antagonist (Spironolactone or Eplerenone) prescribed? - Not Applicable (EF >/=  42%) 8. Cardiac Rehab Phase II ordered (Included Medically managed Patients)? - Yes     Outstanding Labs/Studies   FLP/LFTs in 6 weeks if tolerating statin. CBC/BMET at follow up appt.   Duration of Discharge Encounter   Greater than 30 minutes including physician time.  Signed, Reino Bellis NP-C 09/16/2017, 11:32 AM   Patient seen and examined. Agree with assessment and plan. No recurrent chest pain, s/p PCI/DES to occluded LCX on 8/10 and staged PCI/DES to tandem mid RCA stenosis 8/12. Feels great. Cardiac Rehab.  Stable for DC.   Troy Sine, MD, [facility] 09/16/2017 11:34 AM

## 2017-09-16 NOTE — Telephone Encounter (Signed)
New Message   Patient has a TOC appt scheduled with Kerin Ransom on 10/01/2017.

## 2017-09-16 NOTE — Research (Signed)
AEGIS II Informed Consent   Subject Name: [name]  Subject met inclusion and exclusion criteria.  The informed consent form, study requirements and expectations were reviewed with the subject and questions and concerns were addressed prior to the signing of the consent form.  The subject verbalized understanding of the trail requirements.  The subject agreed to participate in the AEGIS II trial and signed the informed consent.  The informed consent was obtained prior to performance of any protocol-specific procedures for the subject.  A copy of the signed informed consent was given to the subject and a copy was placed in the subject's medical record.  [name] 09/16/2017, 9:09 AM

## 2017-09-16 NOTE — Research (Signed)
AEGIS Visit 1   Dt Stuckey saw patient.  Labs reviewed this am with Dr Lia Foyer.   Inclusions:   Y N   '[x]'$  '[]'$  Male or male at least 55 years of age  '[x]'$  '[]'$  Evidence of type I (spontaneous) MI as defined by the following:  '[x]'$  '[]'$  a. Detection of a rise and/or fall in Troponin I or T with at least 1 value about the 99% upper reference limit.  '[]'$  '[]'$   (AND)---  Any 1 or more of the following:   '[x]'$  '[]'$  - symptoms of ischemia (ie, resulting from a primary coronary    artery event)  '[]'$  '[]'$  - New or presumably new significant ST/T wave changes or left bundle branch block.  '[]'$  '[]'$       - Development of pathological Q waves on EKG  '[]'$  '[]'$  - Imaging evidence of new loss or viable myocardium or regional wall motion abnormality.  '[]'$  '[]'$  - ID of intracoronary thrombus by angiography.  '[x]'$  '[]'$  No suspicion of acute kidney injury at least 12 hours after angiography OR after first medical contract for subject's not undergoing angiography There must be documented evidence of stable renal function defined as no more than an increase in Serum Creatinine < 0.'3mg'$ /dl from pre-contrast serum creatinine value.  (Before _1.08_   12 hrs after __1.06__)  '[x]'$  '[]'$  Evidence of multi-vessel coronary artery disease defined as:  '[x]'$  '[]'$  A. At least 50% stenosis on >1 epicardial artery or left main artery on catherization performed during the index hospitalization.  '[]'$  '[]'$  B. Prior cardiac catherization with at least 50% stenosis on >1 epicardial artery or left main artery.  '[]'$  '[]'$  C. Prior PCI and evidence of at least 50% stenosis of at least 1 epicardial artery different from prior revascularized artery.  '[]'$  '[]'$  D. Prior multivessel coronary artery bypass grafting.  '[x]'$  '[]'$  Plus either Established risk factors:  '[x]'$  '[]'$  o On pharmacological treatment for diabetes mellitus                 OR    TWO of the following  '[]'$  '[]'$  o Prior history of MI  '[]'$  '[]'$  o Age ? 65 years  '[]'$  '[]'$  o Peripheral arterial disease defined as meeting at least  1 of the following criteria:  '[]'$  '[]'$          +   Current intermittent claudication or resting limb ischemia and ABI    ?0.90  '[]'$  '[]'$          +   History of peripheral revascularization (surgical or percutaneous)  '[]'$  '[]'$          +  History of limb amputation due to PAD  '[]'$  '[]'$          +  Angiographic evidence (using computed tomographic angiography, MRA, or invasive angiography or a peripheral artery stenosis ?50%.  '[x]'$  '[]'$  If the male subject without child bearing potential, not breastfeeding, not pregnant, and if of child bearing potential agree to contraception or lifestyle methods to avoid pregnancy? Child-bearing potential (must select all)   ____ not pregnant (by urine or serum hCG AND   ____ willing to use an acceptable method of contraception to avoid pregnancy during the study and for 3 months after last dose of investional product (refer to acceptable methods per protocol)   ____ if breastfeeding, willing to cease breastfeeding Date of pregnancy test: (____ /_____/ ________)  Result  - or + Not of Child bearing potential (select one)  ____ Age >= 32   ____ Age 34-60 with amenorrhea for at least 1 year with documented evidence of follicle-stimulating hormone level >40 IU/L   ____ Surgically sterile for at least 3 months prior to randomization  '[x]'$  '[]'$  Investigator believes that the subject is willing and able to adhere to all protocol requirements.   '[x]'$  '[]'$  Willing to not participate in another investigational study until completion of their final study visit.     Exclusions:  Y N   '[]'$  '[x]'$  If these are the reason for MI (pt is excluded)  '[]'$  '[x]'$  1. Myocardial necrosis due mismatch between myocardial oxygen demand and supply, usually due to fixed coronary disease with increased demand leading to MI  '[]'$  '[x]'$  2. Cardiac death due to MI  '[]'$  '[x]'$  3. Myocardial necrosis due to complications from a PCI  '[]'$  '[x]'$  4. Myocardial necrosis due to stent thrombosis  '[]'$  '[x]'$  5. Myocardial necrosis due to  in stent restenosis as the only etiology  '[]'$  '[x]'$  6. Myocardial necrosis in the stenting of coronary artery bypass grafting  '[]'$  '[x]'$  Ongoing hemodynamic instability  '[]'$  '[x]'$       +  History of NYHA Class III or IV heart failure within the last year  '[]'$  '[x]'$       +  Killip Class III or IV heart failure  '[]'$  '[x]'$       +  Sustained and/or symptomatic hypotension (SBP <90 mm HG)  '[]'$  '[x]'$       +  Known left ventricular ejection fraction of <30%  '[]'$  '[x]'$  Evidence of hepatobiliary disease as indicated by any 1 or more of the   following at screening:  '[]'$  '[x]'$       +  Current active hepatic dysfunction or active biliary obstruction  '[]'$  '[x]'$       +       +  Chronic or prior history of cirrhosis or of infectious / inflammatory hepatitis NOTE: If a patient has a medical history of recovered Hep A, B, or C without evidence of cirrhosis, he/she could be considered for inclusion if there is documented evidence that there is no active infection (ie, antigen negative)  '[]'$  '[x]'$       + Hepatic lab abnormalities: ALT > 3 x ULN or Total bilirubin > 2x ULN at randomization.  '[]'$  '[x]'$  Severe chronic kidney disease (eGFR of <14m) or on dialysis  '[]'$  '[x]'$  Plan to undergo scheduled coronary artery bypass graft surgery after randomization, as determined at the time of screening  '[]'$  '[x]'$  Known history of allergies to soybeans, peanuts, albumin  '[]'$  '[x]'$  Body weight <50 kg  '[]'$  '[x]'$  A known history of IgA deficiency or antibodies to IgA  '[]'$  '[x]'$  A comorbid condition with an estimated life expectancy of ? 6 months at time of consent  '[]'$  '[x]'$  Women who are pregnant or breastfeeding at time of randomization  '[]'$  '[x]'$  Participated in another interventional clinical study at the time of consent  '[]'$  '[x]'$  Treatment with anticancer therapy  '[]'$  '[x]'$  Previously randomized or participated in this study or previously exposed to [facility]    I HAVE NO PROBLEMS WALKING '[x]'$   I HAVE SLIGHT PROBLEMS WALKING '[]'$   I HAVE MODERATE PROBLEMS  WALKING '[]'$   I HAVE SEVERE PROBLEMS WALKING '[]'$   I AM UNABLE TO WALK  '[]'$     SELF-CARE:   I HAVE NO PROBLEMS WASING OR DRESSING MYSELF  '[x]'$   I HAVE SLIGHT PROBLEMS WASHING OR DRESSING MYSELF  '[]'$   I HAVE MODERATE PROBLEMS WASHING OR DRESSING MYSELF '[]'$   I HAVE SEVERE PROBLEMS WASHING OR DRESSING MYSELF  '[]'$   I HAVE SEVERE PROBLEMS WASHING OR DRESSING MYSELF  '[]'$   I AM UNABLE TO [place] OR DRESS MYSELF '[]'$     USUAL ACTIVITIES: (E.G. WORK/STUDY/HOUSEWORK/FAMILY OR LEISURE ACTIVITIES.    I HAVE NO PROBLEMS DOING MY USUAL ACTIVITIES '[x]'$   I HAVE SLIGHT PROBLEMS DOING MY USUAL ACTIVITIES '[]'$   I HAVE MODERATE PROBLEMS DOING MY USUAL ACTIVIITIES '[]'$   I HAVE SEVERE PROBLEMS DOING MY USUAL ACTIVITIES '[]'$   I AM UNABLE TO DO MY USUAL ACTIVITIES '[]'$     PAIN /DISCOMFORT   I HAVE NO PAIN OR DISCOMFORT '[x]'$   I HAVE SLIGHT PAIN OR DISCOMFORT '[]'$   I HAVE MODERATE PAIN OR DISCOMFORT '[]'$   I HAVE SEVERE PAIN OR DISCOMFORT '[]'$   I HAVE EXTREME PAIN OR DISCOMFORT '[]'$     ANXIETY/DEPRESSION   I AM NOT ANXIOUS OR DEPRESSED '[x]'$   I AM SLIGHTLY ANXIOUS OR DEPRESSED '[]'$   I AM MODERATELY ANXIOUS OR DREPRESSED '[]'$   I AM SEVERELY ANXIOUS OR DEPRESSED '[]'$   I AM EXTREMELY ANXIOUS OR DEPRESSED '[]'$     SCALE OF 0-100 HOW WOULD YOU RATE TODAY?  0 IS THE WORSE AND 100 IS THE BEST HEALTH YOU CAN IMAGINE: 90   Pre cath Labs:  Ref Range & Units  09/13/17 @ 5:00  Sodium 135 - 145 mmol/L 138   Potassium 3.5 - 5.1 mmol/L 3.6   Chloride 98 - 111 mmol/L 104   CO2 22 - 32 mmol/L 23   Glucose, Bld 70 - 99 mg/dL 178High    BUN 6 - 20 mg/dL 12   Creatinine, Ser 0.61 - 1.24 mg/dL 1.08   Calcium 8.9 - 10.3 mg/dL 8.7Low    GFR calc non Af Amer >60 mL/min >60   GFR calc Af Amer >60 mL/min >60    Post Cath Labs:    09/16/17 @ 0450  Sodium 135 - 145 mmol/L 139   Potassium 3.5 - 5.1 mmol/L 3.6   Chloride 98 - 111 mmol/L 106   CO2 22 - 32 mmol/L 24   Glucose, Bld 70 - 99 mg/dL 133High    BUN 6 - 20 mg/dL 7   Creatinine, Ser 0.61 - 1.24 mg/dL  1.06   Calcium 8.9 - 10.3 mg/dL 8.2Low    GFR calc non Af Amer >60 mL/min >60   GFR calc Af Amer >60 mL/min >60    Hepatic Function Panel:   09/13/17 @ 0846  Total Protein 6.5 - 8.1 g/dL 6.4Low    Albumin 3.5 - 5.0 g/dL 3.3Low    AST 15 - 41 U/L 37   ALT 0 - 44 U/L 23   Alkaline Phosphatase 38 - 126 U/L 63   Total Bilirubin 0.3 - 1.2 mg/dL 0.9   Bilirubin, Direct 0.0 - 0.2 mg/dL 0.1   Indirect Bilirubin 0.3 - 0.9 mg/dL 0.8

## 2017-09-17 NOTE — Telephone Encounter (Signed)
Patient contacted regarding discharge from [facility] on 09/16/17.  Patient understands to follow up with provider Kerin Ransom, [place] on 10/01/17 at 8:30am at [facility]. Patient understands discharge instructions? Yes Patient understands medications and regiment? Yes Patient understands to bring all medications to this visit? Yes

## 2017-09-17 NOTE — Telephone Encounter (Signed)
Called and LVM for patient to call back to discuss upcoming appointment. Left call back number.

## 2017-09-19 ENCOUNTER — Telehealth (HOSPITAL_COMMUNITY): Payer: Self-pay

## 2017-09-19 DIAGNOSIS — E119 Type 2 diabetes mellitus without complications: Secondary | ICD-10-CM | POA: Diagnosis not present

## 2017-09-19 DIAGNOSIS — E7800 Pure hypercholesterolemia, unspecified: Secondary | ICD-10-CM | POA: Diagnosis not present

## 2017-09-19 DIAGNOSIS — I2121 ST elevation (STEMI) myocardial infarction involving left circumflex coronary artery: Secondary | ICD-10-CM | POA: Diagnosis not present

## 2017-09-19 DIAGNOSIS — Z09 Encounter for follow-up examination after completed treatment for conditions other than malignant neoplasm: Secondary | ICD-10-CM | POA: Diagnosis not present

## 2017-09-19 DIAGNOSIS — I214 Non-ST elevation (NSTEMI) myocardial infarction: Secondary | ICD-10-CM | POA: Diagnosis not present

## 2017-09-19 NOTE — Research (Signed)
Index work sheet:  DEMOGRAPHICS:  Patient Name: [name] Birth Date: [birth year]  Sex: Male  Race: African american  Child Bearing: ? Yes    ? No ? Tubial ligation ? Hysterectomy  ? postmenopausal   Height: 175 cm Weight: 112 kg   Index Procedure:  Onset date of symptoms: 9-Aug-19 Onset of symptoms: 0800  Date of First contact at hospital: 10-Aug-19 Time of first contact at hospital: 0513  Admission Date: 10-Aug-19   Discharge Date: 13-Aug-19 Discharge Time: 13:44   Vital Signs: Date 09/13/2017    Time: 0511 BP: 137/82  Pulse: 77    Concomitant medications: Every visit: ? See med sheet  BMP Pre Contrast IV 09/13/17 @ 0500  1.08  CMP Post Contrast IV 12 hours later: 09/16/17 @ 04:50  1.06 Hepatic Panel: 09/13/17 @ 08:46 ALT: 23 Total Bili: 0.9 Direct Bili: 0.1   Medical History:  ? CAD ? Prior MI ? PAD  ? History of Heart Failure ? Moderate to severe valvular dx ? AFib  ? Prior Coronary Revascularization  if YES please select Yes or No below:  CABG ? Yes   ? No           PCI with stent ? Yes   ? No          PCI without stent ? Yes ? No  ? CVA if checked please select one of the following Choose an item.  ? Hypertension  ? Gilberts syndrome ? CKD   ? Hypocholesteremia ? DM ? Smoker (former)       ? eCigarette  Killip Class Stage 1     EQ-5D-3L ?  90  Future Biomedical Research: Consented ? Yes     ? No If no please date they withdrew consent from biomedical research Click or tap to enter a date.  Central Labs Before Start of Infusion: ? Biochemistry panel      ? Hematology     ? Immunogenicity   (30 mins before infusion)   ? Parvovirus  ? FBR sample ? PK/PD sample Central Labs End of Infusion:   ? PK/PD Central Blood Draw Time: Before SOI: _08/13/19 @ 1020__ After EOI: _08/13/19 @ 1226__ Infusion Start Time: 09/16/2017 10:25 AM  Infusion End Time: 09/16/2017 12:23 PM Vitals in am of infusion: 09/16/17 == BP 143/100  HR 72

## 2017-09-19 NOTE — Research (Signed)
Aegis visit 1 and 2  Pt doing well. No complaints of cp or sob. Central labs drawn.                                    "CONSENT"   YES     NO   Continuing further Investigational Product and study visits for follow-up? [x]  []   Continuing consent from future biomedical research [x]  []                                   "EVENTS"    YES     NO  AE   (IF YES SEE SOURCE) []  [x]   SAE  (IF YES SEE SOURCE) []  [x]   ENDPOINT   (IF YES SEE SOURCE) []  [x]   REVASCULARIZATION  (IF YES SEE SOURCE) []  [x]   AMPUTATION   (IF YES SEE SOURCE) []  [x]   TROPONIN'S  (IF YES SEE SOURCE) []  [x] 

## 2017-09-19 NOTE — Telephone Encounter (Signed)
Pt insurance is active and benefits verified through [facility]. Co-pay $10.00, DED $0.00/$0.00 met, out of pocket $3,400.00/$70.00 met, co-insurance 0%. No pre-authorization. Passport, 09/19/17 @ 10:35AM, YOO#[id number]  Will contact patient to see if he is interested in the Cardiac Rehab Program. If interested, patient will need to complete follow up appt. Once completed, patient will be contacted for scheduling upon review by the RN Navigator.

## 2017-09-22 ENCOUNTER — Encounter: Payer: Medicare HMO | Admitting: *Deleted

## 2017-09-22 DIAGNOSIS — Z006 Encounter for examination for normal comparison and control in clinical research program: Secondary | ICD-10-CM

## 2017-09-23 ENCOUNTER — Encounter: Payer: Medicare HMO | Admitting: *Deleted

## 2017-09-23 ENCOUNTER — Ambulatory Visit (HOSPITAL_COMMUNITY)
Admission: RE | Admit: 2017-09-23 | Discharge: 2017-09-23 | Disposition: A | Discharge status: Home / Self Care | Payer: Medicare HMO | Source: Ambulatory Visit | Attending: Internal Medicine | Admitting: Internal Medicine

## 2017-09-23 VITALS — BP 126/77 | HR 73

## 2017-09-23 DIAGNOSIS — Z006 Encounter for examination for normal comparison and control in clinical research program: Secondary | ICD-10-CM | POA: Insufficient documentation

## 2017-09-23 LAB — BASIC METABOLIC PANEL
BUN/Creatinine Ratio: 10 (ref 9–20)
BUN: 12 mg/dL (ref 6–24)
CALCIUM: 9 mg/dL (ref 8.7–10.2)
CO2: 18 mmol/L — ABNORMAL LOW (ref 20–29)
Chloride: 101 mmol/L (ref 96–106)
Creatinine, Ser: 1.17 mg/dL (ref 0.76–1.27)
GFR, EST AFRICAN AMERICAN: 81 mL/min/{1.73_m2} (ref >59)
GFR, EST NON AFRICAN AMERICAN: 70 mL/min/{1.73_m2} (ref >59)
Glucose: 161 mg/dL — ABNORMAL HIGH (ref 65–99)
POTASSIUM: 4.1 mmol/L (ref 3.5–5.2)
Sodium: 137 mmol/L (ref 134–144)

## 2017-09-23 MED ORDER — STUDY - AEGIS II STUDY - PLACEBO OR CSL112 (PI-HILTY)
170.0000 mL | INTRAVENOUS | Status: DC
  Administered 2017-09-23: 170 mL via INTRAVENOUS
  Filled 2017-09-23: qty 170

## 2017-09-23 NOTE — Progress Notes (Signed)
Aegis Visit 3 infusion 2  Pt doing well, no complaints of cp or sob. Dr Debara Pickett reviewed local labs before randomization.  Meds the same, no changes. Central labs drawn today.                                   "CONSENT"   YES     NO   Continuing further Investigational Product and study visits for follow-up? [x]  []   Continuing consent from future biomedical research [x]  []                                    "EVENTS"    YES     NO  AE   (IF YES SEE SOURCE) []  [x]   SAE  (IF YES SEE SOURCE) []  [x]   ENDPOINT   (IF YES SEE SOURCE) []  [x]   REVASCULARIZATION  (IF YES SEE SOURCE) []  [x]   AMPUTATION   (IF YES SEE SOURCE) []  [x]   TROPONIN'S  (IF YES SEE SOURCE) []  [x]     Current Outpatient Medications:  .  aspirin EC 81 MG tablet, Take 81 mg by mouth daily., Disp: , Rfl:  .  atorvastatin (LIPITOR) 80 MG tablet, Take 1 tablet (80 mg total) by mouth daily at 6 PM., Disp: 90 tablet, Rfl: 2 .  busPIRone (BUSPAR) 10 MG tablet, Take 1 tablet (10 mg total) by mouth at bedtime., Disp: , Rfl:  .  citalopram (CELEXA) 40 MG tablet, Take 40 mg by mouth daily., Disp: , Rfl:  .  docusate sodium (COLACE) 100 MG capsule, Take 100 mg by mouth 2 (two) times daily., Disp: , Rfl:  .  gabapentin (NEURONTIN) 300 MG capsule, Take 1 capsule (300 mg total) by mouth 2 (two) times daily., Disp: 90 capsule, Rfl: 11 .  glipiZIDE (GLUCOTROL XL) 5 MG 24 hr tablet, Take 5 mg by mouth daily., Disp: , Rfl:  .  lisinopril (PRINIVIL,ZESTRIL) 10 MG tablet, Take 1 tablet (10 mg total) by mouth daily., Disp: 30 tablet, Rfl: 4 .  memantine (NAMENDA) 10 MG tablet, Take 10 mg by mouth 2 (two) times daily., Disp: , Rfl:  .  metFORMIN (GLUCOPHAGE) 500 MG tablet, Take 1 tablet (500 mg total) by mouth 2 (two) times daily with a meal., Disp: 180 tablet, Rfl: 3 .  metoprolol tartrate (LOPRESSOR) 25 MG tablet, Take 1 tablet (25 mg total) by mouth 2 (two) times daily., Disp: 60 tablet, Rfl: 2 .  modafinil (PROVIGIL) 100 MG tablet, Take 100 mg by  mouth daily., Disp: , Rfl:  .  oxyCODONE (OXY IR/ROXICODONE) 5 MG immediate release tablet, Take 5 mg by mouth every 8 (eight) hours as needed for severe pain., Disp: , Rfl:  .  polyethylene glycol (MIRALAX / GLYCOLAX) packet, Take 17 g by mouth daily., Disp: , Rfl:  .  sildenafil (VIAGRA) 100 MG tablet, Take 100 mg by mouth daily as needed for erectile dysfunction. , Disp: , Rfl:  .  ticagrelor (BRILINTA) 90 MG TABS tablet, Take 1 tablet (90 mg total) by mouth 2 (two) times daily., Disp: 180 tablet, Rfl: 2 .  DULoxetine (CYMBALTA) 20 MG capsule, Take 2 capsules (40 mg total) by mouth daily. (Patient not taking: Reported on 09/13/2017), Disp: 240 capsule, Rfl: 2 .  hydrocortisone (ANUCORT-HC) 25 MG suppository, Place 1 suppository rectally daily as needed for hemorrhoids or itching. ,  Disp: , Rfl:  .  mupirocin ointment (BACTROBAN) 2 %, Place 1 application into the nose 2 (two) times daily., Disp: , Rfl:  .  nitroGLYCERIN (NITROSTAT) 0.4 MG SL tablet, Place 1 tablet (0.4 mg total) under the tongue every 5 (five) minutes x 3 doses as needed for chest pain. (Patient not taking: Reported on 09/23/2017), Disp: 25 tablet, Rfl: 2 No current facility-administered medications for this visit.   Facility-Administered Medications Ordered in Other Visits:  .  AEGIS II Study - Placebo / CSL112 ([facility]), 170 mL, Intravenous, Q7 days, Pixie Casino, MD, Last Rate: 85 mL/hr at 09/23/17 0907, 170 mL at 09/23/17 0454

## 2017-09-24 DIAGNOSIS — M4696 Unspecified inflammatory spondylopathy, lumbar region: Secondary | ICD-10-CM | POA: Diagnosis not present

## 2017-09-24 DIAGNOSIS — M533 Sacrococcygeal disorders, not elsewhere classified: Secondary | ICD-10-CM | POA: Diagnosis not present

## 2017-09-24 DIAGNOSIS — M47816 Spondylosis without myelopathy or radiculopathy, lumbar region: Secondary | ICD-10-CM | POA: Diagnosis not present

## 2017-09-24 DIAGNOSIS — E119 Type 2 diabetes mellitus without complications: Secondary | ICD-10-CM | POA: Diagnosis not present

## 2017-09-24 DIAGNOSIS — E7800 Pure hypercholesterolemia, unspecified: Secondary | ICD-10-CM | POA: Diagnosis not present

## 2017-09-25 NOTE — Research (Signed)
Patient seen and examined for AEGIS II study.  Explained to patient and all questions answered.    Killip Class I.  Lungs clear.  Cardiac exam stable.

## 2017-09-28 NOTE — Progress Notes (Signed)
Lab draw only for infusion 2

## 2017-09-29 NOTE — Research (Signed)
PK labs

## 2017-09-29 NOTE — Progress Notes (Signed)
Central labs

## 2017-09-30 ENCOUNTER — Ambulatory Visit (HOSPITAL_COMMUNITY)
Admission: RE | Admit: 2017-09-30 | Discharge: 2017-09-30 | Disposition: A | Discharge status: Home / Self Care | Payer: Medicare HMO | Source: Ambulatory Visit | Attending: Internal Medicine | Admitting: Internal Medicine

## 2017-09-30 ENCOUNTER — Encounter: Payer: Medicare HMO | Admitting: *Deleted

## 2017-09-30 VITALS — BP 121/78 | HR 68

## 2017-09-30 DIAGNOSIS — Z006 Encounter for examination for normal comparison and control in clinical research program: Secondary | ICD-10-CM | POA: Insufficient documentation

## 2017-09-30 MED ORDER — STUDY - AEGIS II STUDY - PLACEBO OR CSL112 (PI-HILTY)
170.0000 mL | INTRAVENOUS | Status: DC
  Administered 2017-09-30: 170 mL via INTRAVENOUS
  Filled 2017-09-30: qty 170

## 2017-09-30 NOTE — Progress Notes (Signed)
Aegis visit 4 infusion 3  Patient doing well, no complaints of sob or cp. Seeing cardiology tomorrow for his hospital follow up. Only med change is his oxy to buprenorphine but he hasn't started because he wants to make sure its ok by his cardiologist.   Central labs:                                      "CONSENT"   YES     NO   Continuing further Investigational Product and study visits for follow-up? [x]  []   Continuing consent from future biomedical research [x]  []                                    "EVENTS"    YES     NO  AE   (IF YES SEE SOURCE) []  [x]   SAE  (IF YES SEE SOURCE) []  [x]   ENDPOINT   (IF YES SEE SOURCE) []  [x]   REVASCULARIZATION  (IF YES SEE SOURCE) []  [x]   AMPUTATION   (IF YES SEE SOURCE) []  [x]   TROPONIN'S  (IF YES SEE SOURCE) []  [x]     Current Outpatient Medications:  .  aspirin EC 81 MG tablet, Take 81 mg by mouth daily., Disp: , Rfl:  .  atorvastatin (LIPITOR) 80 MG tablet, Take 1 tablet (80 mg total) by mouth daily at 6 PM., Disp: 90 tablet, Rfl: 2 .  busPIRone (BUSPAR) 10 MG tablet, Take 1 tablet (10 mg total) by mouth at bedtime., Disp: , Rfl:  .  citalopram (CELEXA) 40 MG tablet, Take 40 mg by mouth daily., Disp: , Rfl:  .  docusate sodium (COLACE) 100 MG capsule, Take 100 mg by mouth 2 (two) times daily., Disp: , Rfl:  .  gabapentin (NEURONTIN) 300 MG capsule, Take 1 capsule (300 mg total) by mouth 2 (two) times daily., Disp: 90 capsule, Rfl: 11 .  glipiZIDE (GLUCOTROL XL) 5 MG 24 hr tablet, Take 5 mg by mouth daily., Disp: , Rfl:  .  lisinopril (PRINIVIL,ZESTRIL) 10 MG tablet, Take 1 tablet (10 mg total) by mouth daily., Disp: 30 tablet, Rfl: 4 .  memantine (NAMENDA) 10 MG tablet, Take 10 mg by mouth 2 (two) times daily., Disp: , Rfl:  .  metFORMIN (GLUCOPHAGE) 500 MG tablet, Take 1 tablet (500 mg total) by mouth 2 (two) times daily with a meal., Disp: 180 tablet, Rfl: 3 .  metoprolol tartrate (LOPRESSOR) 25 MG tablet, Take 1 tablet (25 mg total) by mouth  2 (two) times daily. (Patient taking differently: Take 12.5 mg by mouth 2 (two) times daily. ), Disp: 60 tablet, Rfl: 2 .  modafinil (PROVIGIL) 100 MG tablet, Take 100 mg by mouth daily., Disp: , Rfl:  .  polyethylene glycol (MIRALAX / GLYCOLAX) packet, Take 17 g by mouth daily., Disp: , Rfl:  .  sildenafil (VIAGRA) 100 MG tablet, Take 100 mg by mouth daily as needed for erectile dysfunction. , Disp: , Rfl:  .  ticagrelor (BRILINTA) 90 MG TABS tablet, Take 1 tablet (90 mg total) by mouth 2 (two) times daily., Disp: 180 tablet, Rfl: 2 .  Buprenorphine HCl 75 MCG FILM, Place inside cheek., Disp: , Rfl:  .  DULoxetine (CYMBALTA) 20 MG capsule, Take 2 capsules (40 mg total) by mouth daily. (Patient not taking: Reported on 09/13/2017), Disp: 240 capsule, Rfl:  2 .  hydrocortisone (ANUCORT-HC) 25 MG suppository, Place 1 suppository rectally daily as needed for hemorrhoids or itching. , Disp: , Rfl:  .  mupirocin ointment (BACTROBAN) 2 %, Place 1 application into the nose 2 (two) times daily., Disp: , Rfl:  .  nitroGLYCERIN (NITROSTAT) 0.4 MG SL tablet, Place 1 tablet (0.4 mg total) under the tongue every 5 (five) minutes x 3 doses as needed for chest pain. (Patient not taking: Reported on 09/23/2017), Disp: 25 tablet, Rfl: 2 .  oxyCODONE (OXY IR/ROXICODONE) 5 MG immediate release tablet, Take 5 mg by mouth every 8 (eight) hours as needed for severe pain., Disp: , Rfl:  No current facility-administered medications for this visit.   Facility-Administered Medications Ordered in Other Visits:  .  AEGIS II Study - Placebo / CSL112 ([facility]), 170 mL, Intravenous, Q7 days, Pixie Casino, MD, Last Rate: 85 mL/hr at 09/30/17 0858, 170 mL at 09/30/17 [phone]

## 2017-10-01 ENCOUNTER — Encounter: Payer: Self-pay | Admitting: Cardiology

## 2017-10-01 ENCOUNTER — Ambulatory Visit (INDEPENDENT_AMBULATORY_CARE_PROVIDER_SITE_OTHER): Payer: Medicare HMO | Admitting: Cardiology

## 2017-10-01 VITALS — BP 110/68 | HR 66 | Ht 69.0 in | Wt 235.0 lb

## 2017-10-01 DIAGNOSIS — I214 Non-ST elevation (NSTEMI) myocardial infarction: Secondary | ICD-10-CM

## 2017-10-01 DIAGNOSIS — I2510 Atherosclerotic heart disease of native coronary artery without angina pectoris: Secondary | ICD-10-CM | POA: Diagnosis not present

## 2017-10-01 DIAGNOSIS — G894 Chronic pain syndrome: Secondary | ICD-10-CM | POA: Diagnosis not present

## 2017-10-01 DIAGNOSIS — Z9861 Coronary angioplasty status: Secondary | ICD-10-CM | POA: Diagnosis not present

## 2017-10-01 DIAGNOSIS — I10 Essential (primary) hypertension: Secondary | ICD-10-CM | POA: Diagnosis not present

## 2017-10-01 DIAGNOSIS — E119 Type 2 diabetes mellitus without complications: Secondary | ICD-10-CM | POA: Diagnosis not present

## 2017-10-01 NOTE — Progress Notes (Signed)
10/01/2017 [name]   15-Nov-1960  [id number]  Primary Physician [name], MD Primary Cardiologist: Dr Stanford Breed  HPI:  57 y/o AA male with a history of NIDDM, HTN, HLD, chronic pain secondary to C-spine DJD and fibromyalgia- on disability, presented 09/13/17 with [place] and ruled in for a NSTEMI. He was taken to the cath lab secondary to persistent chest pain despite medical Rx. Cath revealed an occluded CFX and OM1 bifurcation. This was treated with PCI and DES. He also had residual dLAD of 85%, and proximal-mid RCA 90%. His EF was 50% with inferior lateral and anterolateral HK. He was brought back to the lab 09/15/17 and had successful RCA PCI with DES. His Troponin peaked at 59.55. He was discharged 09/16/17 and is in the office today for follow up. He denies any chest pain (presenting symptoms were SSCP and jaw pain). Overall he feels a little weak. He has noticed some SOB after taking Brilinta but he thinks taking it with a little coffee helps.    Current Outpatient Medications  Medication Sig Dispense Refill  . aspirin EC 81 MG tablet Take 81 mg by mouth daily.    Marland Kitchen atorvastatin (LIPITOR) 80 MG tablet Take 1 tablet (80 mg total) by mouth daily at 6 PM. 90 tablet 2  . Buprenorphine HCl 75 MCG FILM Place inside cheek.    . busPIRone (BUSPAR) 10 MG tablet Take 1 tablet (10 mg total) by mouth at bedtime.    . citalopram (CELEXA) 40 MG tablet Take 40 mg by mouth daily.    Marland Kitchen docusate sodium (COLACE) 100 MG capsule Take 100 mg by mouth 2 (two) times daily.    Marland Kitchen gabapentin (NEURONTIN) 300 MG capsule Take 1 capsule (300 mg total) by mouth 2 (two) times daily. 90 capsule 11  . glipiZIDE (GLUCOTROL XL) 5 MG 24 hr tablet Take 5 mg by mouth daily.    . hydrocortisone (ANUCORT-HC) 25 MG suppository Place 1 suppository rectally daily as needed for hemorrhoids or itching.     Marland Kitchen lisinopril (PRINIVIL,ZESTRIL) 10 MG tablet Take 1 tablet (10 mg total) by mouth daily. 30 tablet 4  . memantine (NAMENDA)  10 MG tablet Take 10 mg by mouth 2 (two) times daily.    . metFORMIN (GLUCOPHAGE) 500 MG tablet Take 1 tablet (500 mg total) by mouth 2 (two) times daily with a meal. 180 tablet 3  . metoprolol tartrate (LOPRESSOR) 25 MG tablet Take 12.5 mg by mouth 2 (two) times daily.    . mupirocin ointment (BACTROBAN) 2 % Place 1 application into the nose 2 (two) times daily.    . nitroGLYCERIN (NITROSTAT) 0.4 MG SL tablet Place 1 tablet (0.4 mg total) under the tongue every 5 (five) minutes x 3 doses as needed for chest pain. 25 tablet 2  . polyethylene glycol (MIRALAX / GLYCOLAX) packet Take 17 g by mouth daily.    . sildenafil (VIAGRA) 100 MG tablet Take 100 mg by mouth daily as needed for erectile dysfunction.     . ticagrelor (BRILINTA) 90 MG TABS tablet Take 1 tablet (90 mg total) by mouth 2 (two) times daily. 180 tablet 2   No current facility-administered medications for this visit.     Allergies  Allergen Reactions  . Cephalexin Hives and Swelling    Past Medical History:  Diagnosis Date  . Anxiety   . Arthritis    "neck, shoulders, lower back" (09/15/2017)  . Carpal tunnel syndrome   . Chronic back pain    "  neck, lower back" (09/15/2017)  . Depression   . DVT (deep venous thrombosis) ([place]) ~ 2016   following prostate surgery, treated with course of blood thinners.  . Erectile dysfunction   . Fibromyalgia   . Former tobacco use   . Heart murmur    "born w/one but it closed"  . Hyperlipidemia   . Hypertension   . Positive TB test   . Prostate cancer ([place]) 2016   a. s/p prostate surgery.  . Sciatica   . Spinal stenosis   . STEMI (ST elevation myocardial infarction) ([place]) 09/13/2017   PCI/DESx1 to the LAD, with staged intervention DESx1 to the RCA, normal EF  . Type II diabetes mellitus ([place])     Social History   Socioeconomic History  . Marital status: Married    Spouse name: Not on file  . Number of children: 8  . Years of education: Not on file  . Highest education  level: Not on file  Occupational History  . Occupation: truck Animator Needs  . Financial resource strain: Not on file  . Food insecurity:    Worry: Not on file    Inability: Not on file  . Transportation needs:    Medical: Not on file    Non-medical: Not on file  Tobacco Use  . Smoking status: Former Smoker    Packs/day: 0.12    Years: 3.00    Pack years: 0.36    Types: Cigarettes  . Smokeless tobacco: Never Used  . Tobacco comment: 09/15/2017 "nothing in the 200s"  Substance and Sexual Activity  . Alcohol use: Not Currently  . Drug use: Not Currently  . Sexual activity: Not Currently  Lifestyle  . Physical activity:    Days per week: Not on file    Minutes per session: Not on file  . Stress: Not on file  Relationships  . Social connections:    Talks on phone: Not on file    Gets together: Not on file    Attends religious service: Not on file    Active member of club or organization: Not on file    Attends meetings of clubs or organizations: Not on file    Relationship status: Not on file  . Intimate partner violence:    Fear of current or ex partner: Not on file    Emotionally abused: Not on file    Physically abused: Not on file    Forced sexual activity: Not on file  Other Topics Concern  . Not on file  Social History Narrative   He lives with wife and daughter.   He is currently not working since October 2014.  He was working as a Administrator, previously was doing long distance, but now only locally.      Family History  Problem Relation Age of Onset  . Diabetes Mother   . Arthritis Mother   . Heart disease Mother        stents  . Diabetes Sister   . Cancer Sister   . Arthritis Maternal Grandmother      Review of Systems: General: negative for chills, fever, night sweats or weight changes.  Cardiovascular: negative for chest pain, dyspnea on exertion, edema, orthopnea, palpitations, paroxysmal nocturnal dyspnea or shortness of  breath Dermatological: negative for rash Respiratory: negative for cough or wheezing Urologic: negative for hematuria Abdominal: negative for nausea, vomiting, diarrhea, bright red blood per rectum, melena, or hematemesis Neurologic: negative for visual changes, syncope, or dizziness All  other systems reviewed and are otherwise negative except as noted above.    Blood pressure 110/68, pulse 66, height 5\' 9"  (1.753 m), weight 235 lb (106.6 kg).  General appearance: alert, cooperative and no distress Neck: no carotid bruit and no JVD Lungs: clear to auscultation bilaterally Heart: regular rate and rhythm Extremities: Rt radial site without hematoma Skin: Skin color, texture, turgor normal. No rashes or lesions Neurologic: Grossly normal  EKG NSR, inferior TWI  ASSESSMENT AND PLAN:   NSTEMI (non-ST elevated myocardial infarction) (HCC) NSTEMI 09/13/17- Troponin peaked 59.55  CAD S/P percutaneous coronary angioplasty CFX and OM1 PCI with DES 09/13/17 Staged RCA PCI with DES 09/15/17 Residual 85% distal LAD-medical Rx  Dyslipidemia, goal LDL below 70 LDL was 78- he was discharged on high dose statin Rx  Non-insulin treated type 2 diabetes mellitus (HCC) HGb A1c was 6.9  Chronic pain Pt has chronic pain secondary to C-spine DJD and fibromyalgia. He is followed in a pain clinic.    PLAN  Same Rx. F/U 2 months with Dr Stanford Breed. Check lipids and LFTs then.   Kerin Ransom PA-C 10/01/2017 9:01 AM

## 2017-10-01 NOTE — Assessment & Plan Note (Signed)
LDL was 78- he was discharged on high dose statin Rx

## 2017-10-01 NOTE — Assessment & Plan Note (Signed)
CFX and OM1 PCI with DES 09/13/17 Staged RCA PCI with DES 09/15/17 Residual 85% distal LAD-medical Rx

## 2017-10-01 NOTE — Assessment & Plan Note (Signed)
NSTEMI 09/13/17- Troponin peaked 59.55

## 2017-10-01 NOTE — Patient Instructions (Addendum)
Medication Instructions:   Your physician recommends that you continue on your current medications as directed. Please refer to the Current Medication list given to you today.  Labwork:  NONE  Testing/Procedures:  NONE  Follow-Up:  Your physician recommends that you schedule a follow-up appointment in: 2-3 months with Dr. Stanford Breed.  Any Other Special Instructions Will Be Listed Below (If Applicable).  If you need a refill on your cardiac medications before your next appointment, please call your pharmacy.

## 2017-10-01 NOTE — Assessment & Plan Note (Signed)
Pt has chronic pain secondary to C-spine DJD and fibromyalgia. He is followed in a pain clinic.

## 2017-10-01 NOTE — Assessment & Plan Note (Signed)
HGb A1c was 6.9

## 2017-10-03 ENCOUNTER — Telehealth (HOSPITAL_COMMUNITY): Payer: Self-pay

## 2017-10-03 NOTE — Telephone Encounter (Signed)
Called and spoke with patient in regards to Cardiac Rehab - Scheduled orientation on 11/13/17 at 8:15am. Patient will attend the 8:15am exc class. Mailed packet. Went over insurance with patient, verbalized understanding.

## 2017-10-07 ENCOUNTER — Encounter: Payer: Medicare HMO | Admitting: *Deleted

## 2017-10-07 ENCOUNTER — Encounter (HOSPITAL_COMMUNITY)
Admission: RE | Admit: 2017-10-07 | Discharge: 2017-10-07 | Disposition: A | Discharge status: Home / Self Care | Payer: Medicare HMO | Source: Ambulatory Visit | Attending: Internal Medicine | Admitting: Internal Medicine

## 2017-10-07 VITALS — BP 107/76 | HR 68

## 2017-10-07 DIAGNOSIS — Z006 Encounter for examination for normal comparison and control in clinical research program: Secondary | ICD-10-CM

## 2017-10-07 MED ORDER — STUDY - AEGIS II STUDY - PLACEBO OR CSL112 (PI-HILTY)
170.0000 mL | INTRAVENOUS | Status: DC
  Administered 2017-10-07: 170 mL via INTRAVENOUS
  Filled 2017-10-07: qty 170

## 2017-10-07 NOTE — Progress Notes (Signed)
[facility] Visit 5 infusion 4  Pt doing well, no complaints of cp or sob.  PK only lab drawn today.                                    "CONSENT"   YES     NO   Continuing further Investigational Product and study visits for follow-up? [x]  []   Continuing consent from future biomedical research [x]  []                                    "EVENTS"    YES     NO  AE   (IF YES SEE SOURCE) []  [x]   SAE  (IF YES SEE SOURCE) []  [x]   ENDPOINT   (IF YES SEE SOURCE) []  [x]   REVASCULARIZATION  (IF YES SEE SOURCE) []  [x]   AMPUTATION   (IF YES SEE SOURCE) []  [x]   TROPONIN'S  (IF YES SEE SOURCE) []  [x]     Current Outpatient Medications:  .  aspirin EC 81 MG tablet, Take 81 mg by mouth daily., Disp: , Rfl:  .  atorvastatin (LIPITOR) 80 MG tablet, Take 1 tablet (80 mg total) by mouth daily at 6 PM., Disp: 90 tablet, Rfl: 2 .  busPIRone (BUSPAR) 10 MG tablet, Take 1 tablet (10 mg total) by mouth at bedtime., Disp: , Rfl:  .  citalopram (CELEXA) 40 MG tablet, Take 40 mg by mouth daily., Disp: , Rfl:  .  docusate sodium (COLACE) 100 MG capsule, Take 100 mg by mouth 2 (two) times daily., Disp: , Rfl:  .  gabapentin (NEURONTIN) 300 MG capsule, Take 1 capsule (300 mg total) by mouth 2 (two) times daily., Disp: 90 capsule, Rfl: 11 .  glipiZIDE (GLUCOTROL XL) 5 MG 24 hr tablet, Take 5 mg by mouth daily., Disp: , Rfl:  .  hydrocortisone (ANUCORT-HC) 25 MG suppository, Place 1 suppository rectally daily as needed for hemorrhoids or itching. , Disp: , Rfl:  .  lisinopril (PRINIVIL,ZESTRIL) 10 MG tablet, Take 1 tablet (10 mg total) by mouth daily., Disp: 30 tablet, Rfl: 4 .  memantine (NAMENDA) 10 MG tablet, Take 10 mg by mouth 2 (two) times daily., Disp: , Rfl:  .  metFORMIN (GLUCOPHAGE) 500 MG tablet, Take 1 tablet (500 mg total) by mouth 2 (two) times daily with a meal., Disp: 180 tablet, Rfl: 3 .  metoprolol tartrate (LOPRESSOR) 25 MG tablet, Take 12.5 mg by mouth 2 (two) times daily., Disp: , Rfl:  .  mupirocin  ointment (BACTROBAN) 2 %, Place 1 application into the nose 2 (two) times daily., Disp: , Rfl:  .  nitroGLYCERIN (NITROSTAT) 0.4 MG SL tablet, Place 1 tablet (0.4 mg total) under the tongue every 5 (five) minutes x 3 doses as needed for chest pain., Disp: 25 tablet, Rfl: 2 .  polyethylene glycol (MIRALAX / GLYCOLAX) packet, Take 17 g by mouth daily., Disp: , Rfl:  .  sildenafil (VIAGRA) 100 MG tablet, Take 100 mg by mouth daily as needed for erectile dysfunction. , Disp: , Rfl:  .  ticagrelor (BRILINTA) 90 MG TABS tablet, Take 1 tablet (90 mg total) by mouth 2 (two) times daily., Disp: 180 tablet, Rfl: 2 .  Buprenorphine HCl 75 MCG FILM, Place inside cheek., Disp: , Rfl:  No current facility-administered medications for this visit.   Facility-Administered Medications Ordered in  Other Visits:  .  AEGIS II Study - Placebo / CSL112 (PI-Hilty), 170 mL, Intravenous, Q7 days, Hilty, Nadean Corwin, MD, Stopped at 10/07/17 1108

## 2017-10-10 ENCOUNTER — Telehealth: Payer: Self-pay | Admitting: Cardiology

## 2017-10-10 NOTE — Telephone Encounter (Signed)
10/10/2017 Received FMLA back from [employer] on patient for Dr. Stanford Breed to sign I put in his mail box in back until he return.  cbr

## 2017-10-14 ENCOUNTER — Encounter: Payer: Medicare HMO | Admitting: *Deleted

## 2017-10-14 VITALS — BP 121/54 | HR 70 | Wt 236.4 lb

## 2017-10-14 DIAGNOSIS — Z006 Encounter for examination for normal comparison and control in clinical research program: Secondary | ICD-10-CM

## 2017-10-14 NOTE — Progress Notes (Signed)
[facility] Visit 6  Pt doing well, no complaints of cp or sob. No med changes.                                      "CONSENT"   YES     NO   Continuing further Investigational Product and study visits for follow-up? [x]  []   Continuing consent from future biomedical research [x]  []                                      "EVENTS"    YES     NO  AE   (IF YES SEE SOURCE) []  [x]   SAE  (IF YES SEE SOURCE) []  [x]   ENDPOINT   (IF YES SEE SOURCE) []  [x]   REVASCULARIZATION  (IF YES SEE SOURCE) []  [x]   AMPUTATION   (IF YES SEE SOURCE) []  [x]   TROPONIN'S  (IF YES SEE SOURCE) []  [x]        Current Outpatient Medications:  .  aspirin EC 81 MG tablet, Take 81 mg by mouth daily., Disp: , Rfl:  .  atorvastatin (LIPITOR) 80 MG tablet, Take 1 tablet (80 mg total) by mouth daily at 6 PM., Disp: 90 tablet, Rfl: 2 .  Buprenorphine HCl 75 MCG FILM, Place inside cheek., Disp: , Rfl:  .  busPIRone (BUSPAR) 10 MG tablet, Take 1 tablet (10 mg total) by mouth at bedtime., Disp: , Rfl:  .  citalopram (CELEXA) 40 MG tablet, Take 40 mg by mouth daily., Disp: , Rfl:  .  docusate sodium (COLACE) 100 MG capsule, Take 100 mg by mouth 2 (two) times daily., Disp: , Rfl:  .  gabapentin (NEURONTIN) 300 MG capsule, Take 1 capsule (300 mg total) by mouth 2 (two) times daily., Disp: 90 capsule, Rfl: 11 .  glipiZIDE (GLUCOTROL XL) 5 MG 24 hr tablet, Take 5 mg by mouth daily., Disp: , Rfl:  .  hydrocortisone (ANUCORT-HC) 25 MG suppository, Place 1 suppository rectally daily as needed for hemorrhoids or itching. , Disp: , Rfl:  .  lisinopril (PRINIVIL,ZESTRIL) 10 MG tablet, Take 1 tablet (10 mg total) by mouth daily., Disp: 30 tablet, Rfl: 4 .  memantine (NAMENDA) 10 MG tablet, Take 10 mg by mouth 2 (two) times daily., Disp: , Rfl:  .  metFORMIN (GLUCOPHAGE) 500 MG tablet, Take 1 tablet (500 mg total) by mouth 2 (two) times daily with a meal., Disp: 180 tablet, Rfl: 3 .  metoprolol tartrate (LOPRESSOR) 25 MG tablet, Take 12.5 mg by  mouth 2 (two) times daily., Disp: , Rfl:  .  polyethylene glycol (MIRALAX / GLYCOLAX) packet, Take 17 g by mouth daily., Disp: , Rfl:  .  ticagrelor (BRILINTA) 90 MG TABS tablet, Take 1 tablet (90 mg total) by mouth 2 (two) times daily., Disp: 180 tablet, Rfl: 2 .  mupirocin ointment (BACTROBAN) 2 %, Place 1 application into the nose 2 (two) times daily., Disp: , Rfl:  .  nitroGLYCERIN (NITROSTAT) 0.4 MG SL tablet, Place 1 tablet (0.4 mg total) under the tongue every 5 (five) minutes x 3 doses as needed for chest pain. (Patient not taking: Reported on 10/14/2017), Disp: 25 tablet, Rfl: 2 .  sildenafil (VIAGRA) 100 MG tablet, Take 100 mg by mouth daily as needed for erectile dysfunction. , Disp: , Rfl:

## 2017-10-14 NOTE — Telephone Encounter (Signed)
10/14/2017 called saying her employer did not received FMLA that I faxed, I called employer office to make sure if they had received they said no so I refaxed it and I received conformation that it was suscessful. I interofficed the original back to [facility]

## 2017-10-24 DIAGNOSIS — Z23 Encounter for immunization: Secondary | ICD-10-CM | POA: Diagnosis not present

## 2017-10-28 ENCOUNTER — Telehealth (HOSPITAL_COMMUNITY): Payer: Self-pay

## 2017-11-10 ENCOUNTER — Telehealth (HOSPITAL_COMMUNITY): Payer: Self-pay

## 2017-11-10 ENCOUNTER — Telehealth (HOSPITAL_COMMUNITY): Payer: Self-pay | Admitting: Pharmacist

## 2017-11-10 NOTE — Telephone Encounter (Signed)
Patient is unable to go to cardiac rehab appointments because patient is a "full time student in college"   Thank you for allowing pharmacy to be a part of this patient's care.  Tamela Gammon, PharmD 11/10/2017 5:16 PM PGY-1 Pharmacy Resident Direct Phone: [phone] Please check [email or web address] for unit-specific pharmacist phone numbers

## 2017-11-10 NOTE — Telephone Encounter (Signed)
**  UPDATE**  Pt insurance is active and benefits verified through [facility]. Co-pay $10.00, DED $0.00/$0.00 met, out of pocket $3,400.00/$1,018.94 met, co-insurance 0%. No pre-authorization. Passport, 11/10/17 @ 11:25Am, REF# [id number]

## 2017-11-12 ENCOUNTER — Telehealth (HOSPITAL_COMMUNITY): Payer: Self-pay | Admitting: *Deleted

## 2017-11-12 ENCOUNTER — Telehealth (HOSPITAL_COMMUNITY): Payer: Self-pay

## 2017-11-12 NOTE — Telephone Encounter (Signed)
Per Drucie Opitz, patient has a college child full time and cannot participate at this time. Cancelled orientation and all appts. Closed referral.

## 2017-11-13 ENCOUNTER — Inpatient Hospital Stay (HOSPITAL_COMMUNITY): Admission: RE | Admit: 2017-11-13 | Payer: Self-pay | Source: Ambulatory Visit

## 2017-11-14 ENCOUNTER — Encounter: Payer: Self-pay | Admitting: *Deleted

## 2017-11-14 DIAGNOSIS — Z006 Encounter for examination for normal comparison and control in clinical research program: Secondary | ICD-10-CM

## 2017-11-17 ENCOUNTER — Ambulatory Visit (HOSPITAL_COMMUNITY): Payer: Self-pay

## 2017-11-19 ENCOUNTER — Ambulatory Visit (HOSPITAL_COMMUNITY): Payer: Self-pay

## 2017-11-21 ENCOUNTER — Ambulatory Visit (HOSPITAL_COMMUNITY): Payer: Self-pay

## 2017-11-24 ENCOUNTER — Ambulatory Visit (HOSPITAL_COMMUNITY): Payer: Self-pay

## 2017-11-26 ENCOUNTER — Ambulatory Visit (HOSPITAL_COMMUNITY): Payer: Self-pay

## 2017-11-28 ENCOUNTER — Ambulatory Visit (HOSPITAL_COMMUNITY): Payer: Self-pay

## 2017-12-01 ENCOUNTER — Ambulatory Visit (HOSPITAL_COMMUNITY): Payer: Self-pay

## 2017-12-01 DIAGNOSIS — G894 Chronic pain syndrome: Secondary | ICD-10-CM | POA: Diagnosis not present

## 2017-12-01 DIAGNOSIS — M47816 Spondylosis without myelopathy or radiculopathy, lumbar region: Secondary | ICD-10-CM | POA: Diagnosis not present

## 2017-12-01 DIAGNOSIS — M961 Postlaminectomy syndrome, not elsewhere classified: Secondary | ICD-10-CM | POA: Diagnosis not present

## 2017-12-01 DIAGNOSIS — M533 Sacrococcygeal disorders, not elsewhere classified: Secondary | ICD-10-CM | POA: Diagnosis not present

## 2017-12-01 NOTE — Progress Notes (Signed)
HPI: Follow-up coronary artery disease.  Admitted August 2019 with non-ST elevation myocardial infarction.  Cardiac catheterization at that time showed an 85% distal LAD, 80% proximal RCA, 90% mid RCA, occluded circumflex, occluded marginal.  Patient had PCI of his circumflex and circumflex marginal.  He subsequently had staged PCI of his right coronary artery.  Echocardiogram August 2019 showed ejection fraction 66%, grade 2 diastolic dysfunction. Since last seen, the patient has dyspnea with more extreme activities but not with routine activities. It is relieved with rest. It is not associated with chest pain. There is no orthopnea, PND or pedal edema. There is no syncope or palpitations. There is no exertional chest pain.   Current Outpatient Medications  Medication Sig Dispense Refill  . aspirin EC 81 MG tablet Take 81 mg by mouth daily.    Marland Kitchen atorvastatin (LIPITOR) 80 MG tablet Take 1 tablet (80 mg total) by mouth daily at 6 PM. 90 tablet 2  . Buprenorphine HCl 75 MCG FILM Place inside cheek.    . busPIRone (BUSPAR) 10 MG tablet Take 1 tablet (10 mg total) by mouth at bedtime.    . citalopram (CELEXA) 40 MG tablet Take 40 mg by mouth daily.    Marland Kitchen docusate sodium (COLACE) 100 MG capsule Take 100 mg by mouth 2 (two) times daily.    Marland Kitchen gabapentin (NEURONTIN) 300 MG capsule Take 1 capsule (300 mg total) by mouth 2 (two) times daily. 90 capsule 11  . glipiZIDE (GLUCOTROL XL) 5 MG 24 hr tablet Take 5 mg by mouth daily.    . hydrocortisone (ANUCORT-HC) 25 MG suppository Place 1 suppository rectally daily as needed for hemorrhoids or itching.     Marland Kitchen lisinopril (PRINIVIL,ZESTRIL) 10 MG tablet Take 1 tablet (10 mg total) by mouth daily. 30 tablet 4  . memantine (NAMENDA) 10 MG tablet Take 10 mg by mouth 2 (two) times daily.    . metFORMIN (GLUCOPHAGE) 500 MG tablet Take 1 tablet (500 mg total) by mouth 2 (two) times daily with a meal. 180 tablet 3  . metoprolol tartrate (LOPRESSOR) 25 MG tablet  Take 12.5 mg by mouth 2 (two) times daily.    . mupirocin ointment (BACTROBAN) 2 % Place 1 application into the nose 2 (two) times daily.    . nitroGLYCERIN (NITROSTAT) 0.4 MG SL tablet Place 1 tablet (0.4 mg total) under the tongue every 5 (five) minutes x 3 doses as needed for chest pain. 25 tablet 2  . polyethylene glycol (MIRALAX / GLYCOLAX) packet Take 17 g by mouth daily.    . sildenafil (VIAGRA) 100 MG tablet Take 100 mg by mouth daily as needed for erectile dysfunction.     . ticagrelor (BRILINTA) 90 MG TABS tablet Take 1 tablet (90 mg total) by mouth 2 (two) times daily. 180 tablet 2   No current facility-administered medications for this visit.      Past Medical History:  Diagnosis Date  . Anxiety   . Arthritis    "neck, shoulders, lower back" (09/15/2017)  . Carpal tunnel syndrome   . Chronic back pain    "neck, lower back" (09/15/2017)  . Depression   . DVT (deep venous thrombosis) ([place]) ~ 2016   following prostate surgery, treated with course of blood thinners.  . Erectile dysfunction   . Fibromyalgia   . Former tobacco use   . Heart murmur    "born w/one but it closed"  . Hyperlipidemia   . Hypertension   .  Positive TB test   . Prostate cancer ([place]) 2016   a. s/p prostate surgery.  . Sciatica   . Spinal stenosis   . STEMI (ST elevation myocardial infarction) ([place]) 09/13/2017   PCI/DESx1 to the LAD, with staged intervention DESx1 to the RCA, normal EF  . Type II diabetes mellitus ([place])     Past Surgical History:  Procedure Laterality Date  . ANTERIOR CERVICAL DECOMP/DISCECTOMY FUSION  03/2015  . APPENDECTOMY  1995  . BACK SURGERY    . BILATERAL CARPAL TUNNEL RELEASE Bilateral 2017   "30 days apart"  . CORONARY ANGIOGRAPHY N/A 09/15/2017   Procedure: CORONARY ANGIOGRAPHY;  Surgeon: Jettie Booze, MD;  Location: [facility];  Service: Cardiovascular;  Laterality: N/A;  . CORONARY ANGIOPLASTY WITH STENT PLACEMENT  09/15/2017  . CORONARY STENT  INTERVENTION N/A 09/15/2017   Procedure: CORONARY STENT INTERVENTION;  Surgeon: Jettie Booze, MD;  Location: [facility];  Service: Cardiovascular;  Laterality: N/A;  . CORONARY/GRAFT ACUTE MI REVASCULARIZATION N/A 09/13/2017   Procedure: Coronary/Graft Acute MI Revascularization;  Surgeon: Troy Sine, MD;  Location: [facility];  Service: Cardiovascular;  Laterality: N/A;  . LEFT HEART CATH AND CORONARY ANGIOGRAPHY N/A 09/13/2017   Procedure: LEFT HEART CATH AND CORONARY ANGIOGRAPHY;  Surgeon: Troy Sine, MD;  Location: [facility];  Service: Cardiovascular;  Laterality: N/A;  . OTHER SURGICAL HISTORY  2003   "genital warts removed"  . PROSTATECTOMY  2016    Social History   Socioeconomic History  . Marital status: Married    Spouse name: Not on file  . Number of children: 8  . Years of education: Not on file  . Highest education level: Not on file  Occupational History  . Occupation: truck Animator Needs  . Financial resource strain: Not on file  . Food insecurity:    Worry: Not on file    Inability: Not on file  . Transportation needs:    Medical: Not on file    Non-medical: Not on file  Tobacco Use  . Smoking status: Former Smoker    Packs/day: 0.12    Years: 3.00    Pack years: 0.36    Types: Cigarettes  . Smokeless tobacco: Never Used  . Tobacco comment: 09/15/2017 "nothing in the 200s"  Substance and Sexual Activity  . Alcohol use: Not Currently  . Drug use: Not Currently  . Sexual activity: Not Currently  Lifestyle  . Physical activity:    Days per week: Not on file    Minutes per session: Not on file  . Stress: Not on file  Relationships  . Social connections:    Talks on phone: Not on file    Gets together: Not on file    Attends religious service: Not on file    Active member of club or organization: Not on file    Attends meetings of clubs or organizations: Not on file    Relationship status: Not on file  .  Intimate partner violence:    Fear of current or ex partner: Not on file    Emotionally abused: Not on file    Physically abused: Not on file    Forced sexual activity: Not on file  Other Topics Concern  . Not on file  Social History Narrative   He lives with wife and daughter.   He is currently not working since October 2014.  He was working as a Administrator, previously  was doing long distance, but now only locally.     Family History  Problem Relation Age of Onset  . Diabetes Mother   . Arthritis Mother   . Heart disease Mother        stents  . Diabetes Sister   . Cancer Sister   . Arthritis Maternal Grandmother     ROS: no fevers or chills, productive cough, hemoptysis, dysphasia, odynophagia, melena, hematochezia, dysuria, hematuria, rash, seizure activity, orthopnea, PND, pedal edema, claudication. Remaining systems are negative.  Physical Exam: Well-developed well-nourished in no acute distress.  Skin is warm and dry.  HEENT is normal.  Neck is supple.  Chest is clear to auscultation with normal expansion.  Cardiovascular exam is regular rate and rhythm.  Abdominal exam nontender or distended. No masses palpated. Extremities show no edema. neuro grossly intact  A/P  1 coronary artery disease-patient doing well with no chest pain.  Plan to continue medical therapy with aspirin, Brilinta and statin.  2 hyperlipidemia-continue statin.  Check lipids and liver.  3 diabetes mellitus-management per primary care.  4 hypertension-patient's blood pressure is controlled.  Continue present medications and follow.  [name], MD

## 2017-12-02 DIAGNOSIS — F411 Generalized anxiety disorder: Secondary | ICD-10-CM | POA: Diagnosis not present

## 2017-12-02 DIAGNOSIS — M47816 Spondylosis without myelopathy or radiculopathy, lumbar region: Secondary | ICD-10-CM | POA: Diagnosis not present

## 2017-12-02 DIAGNOSIS — F331 Major depressive disorder, recurrent, moderate: Secondary | ICD-10-CM | POA: Diagnosis not present

## 2017-12-03 ENCOUNTER — Ambulatory Visit (HOSPITAL_COMMUNITY): Payer: Self-pay

## 2017-12-04 ENCOUNTER — Encounter: Payer: Medicare HMO | Admitting: *Deleted

## 2017-12-04 ENCOUNTER — Encounter: Payer: Self-pay | Admitting: Cardiology

## 2017-12-04 ENCOUNTER — Ambulatory Visit: Payer: Medicare HMO | Admitting: Cardiology

## 2017-12-04 VITALS — BP 126/70 | HR 64 | Ht 69.0 in | Wt 235.0 lb

## 2017-12-04 VITALS — BP 126/70 | HR 64 | Wt 235.0 lb

## 2017-12-04 DIAGNOSIS — E785 Hyperlipidemia, unspecified: Secondary | ICD-10-CM | POA: Diagnosis not present

## 2017-12-04 DIAGNOSIS — I2510 Atherosclerotic heart disease of native coronary artery without angina pectoris: Secondary | ICD-10-CM

## 2017-12-04 DIAGNOSIS — Z9861 Coronary angioplasty status: Secondary | ICD-10-CM

## 2017-12-04 DIAGNOSIS — N5231 Erectile dysfunction following radical prostatectomy: Secondary | ICD-10-CM | POA: Diagnosis not present

## 2017-12-04 DIAGNOSIS — Z8546 Personal history of malignant neoplasm of prostate: Secondary | ICD-10-CM | POA: Diagnosis not present

## 2017-12-04 DIAGNOSIS — N393 Stress incontinence (female) (male): Secondary | ICD-10-CM | POA: Diagnosis not present

## 2017-12-04 DIAGNOSIS — Z006 Encounter for examination for normal comparison and control in clinical research program: Secondary | ICD-10-CM

## 2017-12-04 DIAGNOSIS — I10 Essential (primary) hypertension: Secondary | ICD-10-CM | POA: Diagnosis not present

## 2017-12-04 DIAGNOSIS — E7800 Pure hypercholesterolemia, unspecified: Secondary | ICD-10-CM | POA: Diagnosis not present

## 2017-12-04 NOTE — Research (Signed)
Late entry: Visit 7 phone call only  Pt doing well no complaints of cp or sob. No med changes. He has started to college and enjoying it. Will see patient back at end of month for visit 8.                                   "CONSENT"   YES     NO   Continuing further Investigational Product and study visits for follow-up? [x]  []   Continuing consent from future biomedical research [x]  []                                   "EVENTS"    YES     NO  AE   (IF YES SEE SOURCE) []  [x]   SAE  (IF YES SEE SOURCE) []  [x]   ENDPOINT   (IF YES SEE SOURCE) []  [x]   REVASCULARIZATION  (IF YES SEE SOURCE) []  [x]   AMPUTATION   (IF YES SEE SOURCE) []  [x]   TROPONIN'S  (IF YES SEE SOURCE) []  [x]     Current Outpatient Medications:  .  aspirin EC 81 MG tablet, Take 81 mg by mouth daily., Disp: , Rfl:  .  atorvastatin (LIPITOR) 80 MG tablet, Take 1 tablet (80 mg total) by mouth daily at 6 PM., Disp: 90 tablet, Rfl: 2 .  Buprenorphine HCl 75 MCG FILM, Place inside cheek., Disp: , Rfl:  .  busPIRone (BUSPAR) 10 MG tablet, Take 1 tablet (10 mg total) by mouth at bedtime., Disp: , Rfl:  .  citalopram (CELEXA) 40 MG tablet, Take 40 mg by mouth daily., Disp: , Rfl:  .  docusate sodium (COLACE) 100 MG capsule, Take 100 mg by mouth 2 (two) times daily., Disp: , Rfl:  .  gabapentin (NEURONTIN) 300 MG capsule, Take 1 capsule (300 mg total) by mouth 2 (two) times daily., Disp: 90 capsule, Rfl: 11 .  glipiZIDE (GLUCOTROL XL) 5 MG 24 hr tablet, Take 5 mg by mouth daily., Disp: , Rfl:  .  hydrocortisone (ANUCORT-HC) 25 MG suppository, Place 1 suppository rectally daily as needed for hemorrhoids or itching. , Disp: , Rfl:  .  lisinopril (PRINIVIL,ZESTRIL) 10 MG tablet, Take 1 tablet (10 mg total) by mouth daily., Disp: 30 tablet, Rfl: 4 .  memantine (NAMENDA) 10 MG tablet, Take 10 mg by mouth 2 (two) times daily., Disp: , Rfl:  .  metFORMIN (GLUCOPHAGE) 500 MG tablet, Take 1 tablet (500 mg total) by mouth 2 (two) times daily with  a meal., Disp: 180 tablet, Rfl: 3 .  metoprolol tartrate (LOPRESSOR) 25 MG tablet, Take 12.5 mg by mouth 2 (two) times daily., Disp: , Rfl:  .  mupirocin ointment (BACTROBAN) 2 %, Place 1 application into the nose 2 (two) times daily., Disp: , Rfl:  .  nitroGLYCERIN (NITROSTAT) 0.4 MG SL tablet, Place 1 tablet (0.4 mg total) under the tongue every 5 (five) minutes x 3 doses as needed for chest pain., Disp: 25 tablet, Rfl: 2 .  polyethylene glycol (MIRALAX / GLYCOLAX) packet, Take 17 g by mouth daily., Disp: , Rfl:  .  sildenafil (VIAGRA) 100 MG tablet, Take 100 mg by mouth daily as needed for erectile dysfunction. , Disp: , Rfl:  .  ticagrelor (BRILINTA) 90 MG TABS tablet, Take 1 tablet (90 mg total) by mouth 2 (two) times daily., Disp: 180  tablet, Rfl: 2

## 2017-12-04 NOTE — Patient Instructions (Signed)
Medication Instructions:  Continue same medications If you need a refill on your cardiac medications before your next appointment, please call your pharmacy.   Lab work: Fasting Lipid and Liver panels If you have labs (blood work) drawn today and your tests are completely normal, you will receive your results only by: Marland Kitchen MyChart Message (if you have MyChart) OR . A paper copy in the mail If you have any lab test that is abnormal or we need to change your treatment, we will call you to review the results.  Testing/Procedures: None ordered  Follow-Up: At [facility], you and your health needs are our priority.  As part of our continuing mission to provide you with exceptional heart care, we have created designated Provider Care Teams.  These Care Teams include your primary Cardiologist (physician) and Advanced Practice Providers (APPs -  Physician Assistants and Nurse Practitioners) who all work together to provide you with the care you need, when you need it. . Follow up with Dr.Crenshaw in 6 months call 2 months before to schedule

## 2017-12-04 NOTE — Research (Signed)
[facility] Visit 8  Pt saw in clinic today. Doing well, no complaints of cp or sob. Just had follow up with Dr Stanford Breed. Only med change is added modafinil but pt hasn't started yet so not added to med list at this time. Will see pt back in Feb for visit 9.                                    "CONSENT"   YES     NO   Continuing further Investigational Product and study visits for follow-up? [x]  []   Continuing consent from future biomedical research [x]  []                                   "EVENTS"    YES     NO  AE   (IF YES SEE SOURCE) []  [x]   SAE  (IF YES SEE SOURCE) []  [x]   ENDPOINT   (IF YES SEE SOURCE) []  [x]   REVASCULARIZATION  (IF YES SEE SOURCE) []  [x]   AMPUTATION   (IF YES SEE SOURCE) []  [x]   TROPONIN'S  (IF YES SEE SOURCE) []  [x]    Lifestyle Adherence Assessment:    YES NO  Abstinence from smoking/remaining tobacco free X   Cardiac Diet X   Routine physical activity and/or cardiac rehabilitation X    EQ-5D-5L  MOBILITY:    I HAVE NO PROBLEMS WALKING [x]   I HAVE SLIGHT PROBLEMS WALKING []   I HAVE MODERATE PROBLEMS WALKING []   I HAVE SEVERE PROBLEMS WALKING []   I AM UNABLE TO WALK  []     SELF-CARE:   I HAVE NO PROBLEMS WASING OR DRESSING MYSELF  [x]   I HAVE SLIGHT PROBLEMS WASHING OR DRESSING MYSELF  []   I HAVE MODERATE PROBLEMS WASHING OR DRESSING MYSELF []   I HAVE SEVERE PROBLEMS WASHING OR DRESSING MYSELF  []   I HAVE SEVERE PROBLEMS WASHING OR DRESSING MYSELF  []   I AM UNABLE TO WASH OR DRESS MYSELF []     USUAL ACTIVITIES: (E.G. WORK/STUDY/HOUSEWORK/FAMILY OR LEISURE ACTIVITIES.    I HAVE NO PROBLEMS DOING MY USUAL ACTIVITIES [x]   I HAVE SLIGHT PROBLEMS DOING MY USUAL ACTIVITIES []   I HAVE MODERATE PROBLEMS DOING MY USUAL ACTIVIITIES []   I HAVE SEVERE PROBLEMS DOING MY USUAL ACTIVITIES []   I AM UNABLE TO DO MY USUAL ACTIVITIES []     PAIN /DISCOMFORT   I HAVE NO PAIN OR DISCOMFORT []   I HAVE SLIGHT PAIN OR DISCOMFORT [x]   I HAVE MODERATE PAIN OR DISCOMFORT []   I  HAVE SEVERE PAIN OR DISCOMFORT []   I HAVE EXTREME PAIN OR DISCOMFORT []     ANXIETY/DEPRESSION   I AM NOT ANXIOUS OR DEPRESSED [x]   I AM SLIGHTLY ANXIOUS OR DEPRESSED []   I AM MODERATELY ANXIOUS OR DREPRESSED []   I AM SEVERELY ANXIOUS OR DEPRESSED []   I AM EXTREMELY ANXIOUS OR DEPRESSED []     SCALE OF 0-100 HOW WOULD YOU RATE TODAY?  0 IS THE WORSE AND 100 IS THE BEST HEALTH YOU CAN IMAGINE: 90    Current Outpatient Medications:  .  aspirin EC 81 MG tablet, Take 81 mg by mouth daily., Disp: , Rfl:  .  atorvastatin (LIPITOR) 80 MG tablet, Take 1 tablet (80 mg total) by mouth daily at 6 PM., Disp: 90 tablet, Rfl: 2 .  Buprenorphine HCl 75 MCG FILM, Place inside  cheek., Disp: , Rfl:  .  busPIRone (BUSPAR) 10 MG tablet, Take 1 tablet (10 mg total) by mouth at bedtime., Disp: , Rfl:  .  citalopram (CELEXA) 40 MG tablet, Take 40 mg by mouth daily., Disp: , Rfl:  .  docusate sodium (COLACE) 100 MG capsule, Take 100 mg by mouth 2 (two) times daily., Disp: , Rfl:  .  gabapentin (NEURONTIN) 300 MG capsule, Take 1 capsule (300 mg total) by mouth 2 (two) times daily., Disp: 90 capsule, Rfl: 11 .  glipiZIDE (GLUCOTROL XL) 5 MG 24 hr tablet, Take 5 mg by mouth daily., Disp: , Rfl:  .  hydrocortisone (ANUCORT-HC) 25 MG suppository, Place 1 suppository rectally daily as needed for hemorrhoids or itching. , Disp: , Rfl:  .  lisinopril (PRINIVIL,ZESTRIL) 10 MG tablet, Take 1 tablet (10 mg total) by mouth daily., Disp: 30 tablet, Rfl: 4 .  memantine (NAMENDA) 10 MG tablet, Take 10 mg by mouth 2 (two) times daily., Disp: , Rfl:  .  metFORMIN (GLUCOPHAGE) 500 MG tablet, Take 1 tablet (500 mg total) by mouth 2 (two) times daily with a meal., Disp: 180 tablet, Rfl: 3 .  metoprolol tartrate (LOPRESSOR) 25 MG tablet, Take 12.5 mg by mouth 2 (two) times daily., Disp: , Rfl:  .  mupirocin ointment (BACTROBAN) 2 %, Place 1 application into the nose 2 (two) times daily., Disp: , Rfl:  .  nitroGLYCERIN (NITROSTAT) 0.4  MG SL tablet, Place 1 tablet (0.4 mg total) under the tongue every 5 (five) minutes x 3 doses as needed for chest pain., Disp: 25 tablet, Rfl: 2 .  polyethylene glycol (MIRALAX / GLYCOLAX) packet, Take 17 g by mouth daily., Disp: , Rfl:  .  sildenafil (VIAGRA) 100 MG tablet, Take 100 mg by mouth daily as needed for erectile dysfunction. , Disp: , Rfl:  .  ticagrelor (BRILINTA) 90 MG TABS tablet, Take 1 tablet (90 mg total) by mouth 2 (two) times daily., Disp: 180 tablet, Rfl: 2

## 2017-12-05 ENCOUNTER — Ambulatory Visit (HOSPITAL_COMMUNITY): Payer: Self-pay

## 2017-12-08 ENCOUNTER — Ambulatory Visit (HOSPITAL_COMMUNITY): Payer: Self-pay

## 2017-12-10 ENCOUNTER — Ambulatory Visit (HOSPITAL_COMMUNITY): Payer: Self-pay

## 2017-12-11 ENCOUNTER — Telehealth: Payer: Self-pay | Admitting: Cardiology

## 2017-12-11 NOTE — Telephone Encounter (Signed)
New message      [facility] Pre-operative Risk Assessment    Request for surgical clearance:  1. What type of surgery is being performed? Implantation of penile prosthesis  2. When is this surgery scheduled? TBD  3. What type of clearance is required (medical clearance vs. Pharmacy clearance to hold med vs. Both)? Both  4. Are there any medications that need to be held prior to surgery and how long?Brilinta, please have doctor advise how long to hold this medication  5. Practice name and name of physician performing surgery? [facility], Dr. Link Snuffer  6. What is your office phone number [phone]   7.   What is your office fax number [phone]  8.   Anesthesia type (None, local, MAC, general) ? general   Maryjane Hurter 12/11/2017, 11:38 AM  _________________________________________________________________   (provider comments below)

## 2017-12-12 ENCOUNTER — Ambulatory Visit (HOSPITAL_COMMUNITY): Payer: Self-pay

## 2017-12-12 NOTE — Telephone Encounter (Signed)
   Primary Cardiologist: [name], MD  Chart reviewed as part of pre-operative protocol coverage. Patient was contacted 12/12/2017 in reference to pre-operative risk assessment for pending surgery as outlined below.  [name] was last seen on 12/04/17 by Dr. Stanford Breed.   The patient was recently was admitted 09/2017 with acute myocardial infarction (troponin 59) and underwent stenting, initially on 09/13/17 with staged PCI on 09/15/17. The cardiac cath note on 09/15/17 indicates the patient is not allowed to stop his aspirin and Brilinta until 12 months (09/16/2018). Therefore, since clearance indicates patient requires holding blood thinners, he is not cleared at this time. Patient was recently instructed to follow-up in 6 months at which time further clearance can be discussed as it appears this would fall under elective procedure.  I will route this recommendation to the requesting party via Epic fax function and remove from pre-op pool. Will also CC to Dr. Stanford Breed to make him aware.   Please call with questions.  Charlie Pitter, PA-C 12/12/2017, 10:54 AM

## 2017-12-15 ENCOUNTER — Ambulatory Visit (HOSPITAL_COMMUNITY): Payer: Self-pay

## 2017-12-15 NOTE — Telephone Encounter (Signed)
Since elective, would hold on surgery given recent PCI and need for DAPT. [name]

## 2017-12-17 ENCOUNTER — Ambulatory Visit (HOSPITAL_COMMUNITY): Payer: Self-pay

## 2017-12-19 ENCOUNTER — Ambulatory Visit (HOSPITAL_COMMUNITY): Payer: Self-pay

## 2017-12-22 ENCOUNTER — Ambulatory Visit (HOSPITAL_COMMUNITY): Payer: Self-pay

## 2017-12-22 DIAGNOSIS — Z9861 Coronary angioplasty status: Secondary | ICD-10-CM | POA: Diagnosis not present

## 2017-12-22 DIAGNOSIS — I2510 Atherosclerotic heart disease of native coronary artery without angina pectoris: Secondary | ICD-10-CM | POA: Diagnosis not present

## 2017-12-22 DIAGNOSIS — E785 Hyperlipidemia, unspecified: Secondary | ICD-10-CM | POA: Diagnosis not present

## 2017-12-22 LAB — HEPATIC FUNCTION PANEL
ALK PHOS: 76 IU/L (ref 39–117)
ALT: 17 IU/L (ref 0–44)
AST: 17 IU/L (ref 0–40)
Albumin: 4.2 g/dL (ref 3.5–5.5)
BILIRUBIN, DIRECT: 0.18 mg/dL (ref 0.00–0.40)
Bilirubin Total: 0.6 mg/dL (ref 0.0–1.2)
TOTAL PROTEIN: 7 g/dL (ref 6.0–8.5)

## 2017-12-22 LAB — LIPID PANEL W/O CHOL/HDL RATIO
CHOLESTEROL TOTAL: 133 mg/dL (ref 100–199)
HDL: 52 mg/dL (ref >39)
LDL Calculated: 68 mg/dL (ref 0–99)
TRIGLYCERIDES: 64 mg/dL (ref 0–149)
VLDL Cholesterol Cal: 13 mg/dL (ref 5–40)

## 2017-12-24 ENCOUNTER — Ambulatory Visit (HOSPITAL_COMMUNITY): Payer: Self-pay

## 2017-12-24 NOTE — Addendum Note (Signed)
Encounter addended by: Leonarda Salon, RN on: 12/24/2017 10:49 AM  Actions taken: Charge Capture section accepted

## 2017-12-24 NOTE — Addendum Note (Signed)
Encounter addended by: Leonarda Salon, RN on: 12/24/2017 11:17 AM  Actions taken: Charge Capture section accepted

## 2017-12-24 NOTE — Addendum Note (Signed)
Encounter addended by: Leonarda Salon, RN on: 12/24/2017 10:51 AM  Actions taken: Charge Capture section accepted

## 2017-12-26 ENCOUNTER — Ambulatory Visit (HOSPITAL_COMMUNITY): Payer: Self-pay

## 2017-12-29 ENCOUNTER — Ambulatory Visit (HOSPITAL_COMMUNITY): Payer: Self-pay

## 2017-12-31 ENCOUNTER — Ambulatory Visit (HOSPITAL_COMMUNITY): Payer: Self-pay

## 2018-01-02 ENCOUNTER — Ambulatory Visit (HOSPITAL_COMMUNITY): Payer: Self-pay

## 2018-01-05 ENCOUNTER — Ambulatory Visit (HOSPITAL_COMMUNITY): Payer: Self-pay

## 2018-01-07 ENCOUNTER — Ambulatory Visit (HOSPITAL_COMMUNITY): Payer: Self-pay

## 2018-01-09 ENCOUNTER — Ambulatory Visit (HOSPITAL_COMMUNITY): Payer: Self-pay

## 2018-01-12 ENCOUNTER — Ambulatory Visit (HOSPITAL_COMMUNITY): Payer: Self-pay

## 2018-01-14 ENCOUNTER — Ambulatory Visit (HOSPITAL_COMMUNITY): Payer: Self-pay

## 2018-01-16 ENCOUNTER — Ambulatory Visit (HOSPITAL_COMMUNITY): Payer: Self-pay

## 2018-01-19 ENCOUNTER — Ambulatory Visit (HOSPITAL_COMMUNITY): Payer: Self-pay

## 2018-01-21 ENCOUNTER — Ambulatory Visit (HOSPITAL_COMMUNITY): Payer: Self-pay

## 2018-01-23 ENCOUNTER — Ambulatory Visit (HOSPITAL_COMMUNITY): Payer: Self-pay

## 2018-01-26 ENCOUNTER — Ambulatory Visit (HOSPITAL_COMMUNITY): Payer: Self-pay

## 2018-01-30 ENCOUNTER — Ambulatory Visit (HOSPITAL_COMMUNITY): Payer: Self-pay

## 2018-02-02 ENCOUNTER — Ambulatory Visit (HOSPITAL_COMMUNITY): Payer: Self-pay

## 2018-02-04 ENCOUNTER — Other Ambulatory Visit (HOSPITAL_COMMUNITY): Payer: Self-pay | Admitting: Cardiology

## 2018-02-06 ENCOUNTER — Ambulatory Visit (HOSPITAL_COMMUNITY): Payer: Self-pay

## 2018-02-09 ENCOUNTER — Ambulatory Visit (HOSPITAL_COMMUNITY): Payer: Self-pay

## 2018-02-11 ENCOUNTER — Ambulatory Visit (HOSPITAL_COMMUNITY): Payer: Self-pay

## 2018-02-13 ENCOUNTER — Ambulatory Visit (HOSPITAL_COMMUNITY): Payer: Self-pay

## 2018-02-16 ENCOUNTER — Ambulatory Visit (HOSPITAL_COMMUNITY): Payer: Self-pay

## 2018-02-18 ENCOUNTER — Ambulatory Visit (HOSPITAL_COMMUNITY): Payer: Self-pay

## 2018-03-09 ENCOUNTER — Telehealth: Payer: Self-pay | Admitting: *Deleted

## 2018-03-09 NOTE — Telephone Encounter (Signed)
Pt called with appt time and date.

## 2018-03-12 ENCOUNTER — Encounter: Payer: Medicare HMO | Admitting: *Deleted

## 2018-03-12 VITALS — BP 99/77 | HR 65 | Wt 245.0 lb

## 2018-03-12 DIAGNOSIS — Z006 Encounter for examination for normal comparison and control in clinical research program: Secondary | ICD-10-CM

## 2018-03-12 NOTE — Research (Signed)
Pt feeling good, no complaints of cp or sob.                                     "CONSENT"   YES     NO   Continuing further Investigational Product and study visits for follow-up? [x]  []   Continuing consent from future biomedical research [x]  []                                    "EVENTS"    YES     NO  AE   (IF YES SEE SOURCE) []  [x]   SAE  (IF YES SEE SOURCE) []  [x]   ENDPOINT   (IF YES SEE SOURCE) []  [x]   REVASCULARIZATION  (IF YES SEE SOURCE) []  [x]   AMPUTATION   (IF YES SEE SOURCE) []  [x]   TROPONIN'S  (IF YES SEE SOURCE) []  [x]    Lifestyle Adherence Assessment:    YES NO  Abstinence from smoking/remaining tobacco free X   Cardiac Diet X   Routine physical activity and/or cardiac rehabilitation X      EQ-5D-5L  MOBILITY:    I HAVE NO PROBLEMS WALKING [x]   I HAVE SLIGHT PROBLEMS WALKING []   I HAVE MODERATE PROBLEMS WALKING []   I HAVE SEVERE PROBLEMS WALKING []   I AM UNABLE TO WALK  []     SELF-CARE:   I HAVE NO PROBLEMS WASING OR DRESSING MYSELF  [x]   I HAVE SLIGHT PROBLEMS WASHING OR DRESSING MYSELF  []   I HAVE MODERATE PROBLEMS WASHING OR DRESSING MYSELF []   I HAVE SEVERE PROBLEMS WASHING OR DRESSING MYSELF  []   I HAVE SEVERE PROBLEMS WASHING OR DRESSING MYSELF  []   I AM UNABLE TO WASH OR DRESS MYSELF []     USUAL ACTIVITIES: (E.G. WORK/STUDY/HOUSEWORK/FAMILY OR LEISURE ACTIVITIES.    I HAVE NO PROBLEMS DOING MY USUAL ACTIVITIES [x]   I HAVE SLIGHT PROBLEMS DOING MY USUAL ACTIVITIES []   I HAVE MODERATE PROBLEMS DOING MY USUAL ACTIVIITIES []   I HAVE SEVERE PROBLEMS DOING MY USUAL ACTIVITIES []   I AM UNABLE TO DO MY USUAL ACTIVITIES []     PAIN /DISCOMFORT   I HAVE NO PAIN OR DISCOMFORT [x]   I HAVE SLIGHT PAIN OR DISCOMFORT []   I HAVE MODERATE PAIN OR DISCOMFORT []   I HAVE SEVERE PAIN OR DISCOMFORT []   I HAVE EXTREME PAIN OR DISCOMFORT []     ANXIETY/DEPRESSION   I AM NOT ANXIOUS OR DEPRESSED [x]   I AM SLIGHTLY ANXIOUS OR DEPRESSED []   I AM MODERATELY ANXIOUS OR  DREPRESSED []   I AM SEVERELY ANXIOUS OR DEPRESSED []   I AM EXTREMELY ANXIOUS OR DEPRESSED []     SCALE OF 0-100 HOW WOULD YOU RATE TODAY?  0 IS THE WORSE AND 100 IS THE BEST HEALTH YOU CAN IMAGINE: 80    Current Outpatient Medications:  .  aspirin EC 81 MG tablet, Take 81 mg by mouth daily., Disp: , Rfl:  .  atorvastatin (LIPITOR) 80 MG tablet, Take 1 tablet (80 mg total) by mouth daily at 6 PM., Disp: 90 tablet, Rfl: 2 .  Buprenorphine HCl 75 MCG FILM, Place inside cheek., Disp: , Rfl:  .  busPIRone (BUSPAR) 10 MG tablet, Take 1 tablet (10 mg total) by mouth at bedtime., Disp: , Rfl:  .  citalopram (CELEXA) 40 MG tablet, Take 40 mg by mouth daily., Disp: ,  Rfl:  .  docusate sodium (COLACE) 100 MG capsule, Take 100 mg by mouth 2 (two) times daily., Disp: , Rfl:  .  gabapentin (NEURONTIN) 300 MG capsule, Take 1 capsule (300 mg total) by mouth 2 (two) times daily., Disp: 90 capsule, Rfl: 11 .  glipiZIDE (GLUCOTROL XL) 5 MG 24 hr tablet, Take 5 mg by mouth daily., Disp: , Rfl:  .  lisinopril (PRINIVIL,ZESTRIL) 10 MG tablet, Take 1 tablet (10 mg total) by mouth daily., Disp: 30 tablet, Rfl: 4 .  memantine (NAMENDA) 10 MG tablet, Take 10 mg by mouth 2 (two) times daily., Disp: , Rfl:  .  metFORMIN (GLUCOPHAGE) 500 MG tablet, Take 1 tablet (500 mg total) by mouth 2 (two) times daily with a meal., Disp: 180 tablet, Rfl: 3 .  metoprolol tartrate (LOPRESSOR) 25 MG tablet, TAKE 1/2 (ONE-HALF) TABLET BY MOUTH TWICE DAILY, Disp: 60 tablet, Rfl: 9 .  nitroGLYCERIN (NITROSTAT) 0.4 MG SL tablet, Place 1 tablet (0.4 mg total) under the tongue every 5 (five) minutes x 3 doses as needed for chest pain., Disp: 25 tablet, Rfl: 2 .  polyethylene glycol (MIRALAX / GLYCOLAX) packet, Take 17 g by mouth daily., Disp: , Rfl:  .  ticagrelor (BRILINTA) 90 MG TABS tablet, Take 1 tablet (90 mg total) by mouth 2 (two) times daily., Disp: 180 tablet, Rfl: 2 .  hydrocortisone (ANUCORT-HC) 25 MG suppository, Place 1  suppository rectally daily as needed for hemorrhoids or itching. , Disp: , Rfl:  .  mupirocin ointment (BACTROBAN) 2 %, Place 1 application into the nose 2 (two) times daily., Disp: , Rfl:  .  sildenafil (VIAGRA) 100 MG tablet, Take 100 mg by mouth daily as needed for erectile dysfunction. , Disp: , Rfl:

## 2018-06-03 NOTE — Progress Notes (Signed)
Virtual Visit via Video Note   This visit type was conducted due to national recommendations for restrictions regarding the COVID-19 Pandemic (e.g. social distancing) in an effort to limit this patient's exposure and mitigate transmission in our community.  Due to his co-morbid illnesses, this patient is at least at moderate risk for complications without adequate follow up.  This format is felt to be most appropriate for this patient at this time.  All issues noted in this document were discussed and addressed.  A limited physical exam was performed with this format.  Please refer to the patient's chart for his consent to telehealth for [facility].   Evaluation Performed:  Follow-up visit  Date:  06/08/2018   ID:  [name], DOB 12-12-1960, MRN [id number]  Patient Location: Home Provider Location: Home  PCP:  [name], MD  Cardiologist:  [name], MD  Chief Complaint:  FU CAD  History of Present Illness:    Follow-up coronary artery disease.  Admitted August 2019 with non-ST elevation myocardial infarction.  Cardiac catheterization at that time showed an 85% distal LAD, 80% proximal RCA, 90% mid RCA, occluded circumflex, occluded marginal.  Patient had PCI of his circumflex and circumflex marginal.  He subsequently had staged PCI of his right coronary artery.  Echocardiogram August 2019 showed ejection fraction 88%, grade 2 diastolic dysfunction. Since last seen, the patient has dyspnea with more extreme activities but not with routine activities. It is relieved with rest. It is not associated with chest pain. There is no orthopnea, PND or pedal edema. There is no syncope or palpitations. There is no exertional chest pain.   The patient does not have symptoms concerning for COVID-19 infection (fever, chills, cough, or new shortness of breath).    Past Medical History:  Diagnosis Date  . Anxiety   . Arthritis    "neck, shoulders, lower back" (09/15/2017)  . Carpal  tunnel syndrome   . Chronic back pain    "neck, lower back" (09/15/2017)  . Depression   . DVT (deep venous thrombosis) ([place]) ~ 2016   following prostate surgery, treated with course of blood thinners.  . Erectile dysfunction   . Fibromyalgia   . Former tobacco use   . Heart murmur    "born w/one but it closed"  . Hyperlipidemia   . Hypertension   . Positive TB test   . Prostate cancer ([place]) 2016   a. s/p prostate surgery.  . Sciatica   . Spinal stenosis   . STEMI (ST elevation myocardial infarction) ([place]) 09/13/2017   PCI/DESx1 to the LAD, with staged intervention DESx1 to the RCA, normal EF  . Type II diabetes mellitus ([place])    Past Surgical History:  Procedure Laterality Date  . ANTERIOR CERVICAL DECOMP/DISCECTOMY FUSION  03/2015  . APPENDECTOMY  1995  . BACK SURGERY    . BILATERAL CARPAL TUNNEL RELEASE Bilateral 2017   "30 days apart"  . CORONARY ANGIOGRAPHY N/A 09/15/2017   Procedure: CORONARY ANGIOGRAPHY;  Surgeon: Jettie Booze, MD;  Location: [place] [facility];  Service: Cardiovascular;  Laterality: N/A;  . CORONARY ANGIOPLASTY WITH STENT PLACEMENT  09/15/2017  . CORONARY STENT INTERVENTION N/A 09/15/2017   Procedure: CORONARY STENT INTERVENTION;  Surgeon: Jettie Booze, MD;  Location: [place] [facility];  Service: Cardiovascular;  Laterality: N/A;  . CORONARY/GRAFT ACUTE MI REVASCULARIZATION N/A 09/13/2017   Procedure: Coronary/Graft Acute MI Revascularization;  Surgeon: Troy Sine, MD;  Location: [place] [facility];  Service:  Cardiovascular;  Laterality: N/A;  . LEFT HEART CATH AND CORONARY ANGIOGRAPHY N/A 09/13/2017   Procedure: LEFT HEART CATH AND CORONARY ANGIOGRAPHY;  Surgeon: Troy Sine, MD;  Location: [place] [facility];  Service: Cardiovascular;  Laterality: N/A;  . OTHER SURGICAL HISTORY  2003   "genital warts removed"  . PROSTATECTOMY  2016     Current Meds  Medication Sig  . aspirin EC 81 MG tablet Take 81 mg by mouth daily.  Marland Kitchen  atorvastatin (LIPITOR) 80 MG tablet Take 1 tablet (80 mg total) by mouth daily at 6 PM.  . Buprenorphine HCl 75 MCG FILM Place inside cheek.  . busPIRone (BUSPAR) 10 MG tablet Take 1 tablet (10 mg total) by mouth at bedtime.  . citalopram (CELEXA) 40 MG tablet Take 40 mg by mouth daily.  Marland Kitchen docusate sodium (COLACE) 100 MG capsule Take 100 mg by mouth 2 (two) times daily.  Marland Kitchen gabapentin (NEURONTIN) 300 MG capsule Take 1 capsule (300 mg total) by mouth 2 (two) times daily.  Marland Kitchen glipiZIDE (GLUCOTROL XL) 5 MG 24 hr tablet Take 5 mg by mouth daily.  . hydrocortisone (ANUCORT-HC) 25 MG suppository Place 1 suppository rectally daily as needed for hemorrhoids or itching.   Marland Kitchen lisinopril (PRINIVIL,ZESTRIL) 10 MG tablet Take 1 tablet (10 mg total) by mouth daily.  . memantine (NAMENDA) 10 MG tablet Take 10 mg by mouth 2 (two) times daily.  . metFORMIN (GLUCOPHAGE) 500 MG tablet Take 1 tablet (500 mg total) by mouth 2 (two) times daily with a meal.  . metoprolol tartrate (LOPRESSOR) 25 MG tablet TAKE 1/2 (ONE-HALF) TABLET BY MOUTH TWICE DAILY  . mupirocin ointment (BACTROBAN) 2 % Place 1 application into the nose 2 (two) times daily.  . nitroGLYCERIN (NITROSTAT) 0.4 MG SL tablet Place 1 tablet (0.4 mg total) under the tongue every 5 (five) minutes x 3 doses as needed for chest pain.  . polyethylene glycol (MIRALAX / GLYCOLAX) packet Take 17 g by mouth daily.  . ticagrelor (BRILINTA) 90 MG TABS tablet Take 1 tablet (90 mg total) by mouth 2 (two) times daily.     Allergies:   Cephalexin   Social History   Tobacco Use  . Smoking status: Former Smoker    Packs/day: 0.12    Years: 3.00    Pack years: 0.36    Types: Cigarettes  . Smokeless tobacco: Never Used  . Tobacco comment: 09/15/2017 "nothing in the 200s"  Substance Use Topics  . Alcohol use: Not Currently  . Drug use: Not Currently     Family Hx: The patient's family history includes Arthritis in his maternal grandmother and mother; Cancer in his  sister; Diabetes in his mother and sister; Heart disease in his mother.  ROS:   Please see the history of present illness.    No fevers, chills or productive cough All other systems reviewed and are negative.  Recent Labs: 09/16/2017: Hemoglobin 9.5; Platelets 214 09/22/2017: BUN 12; Creatinine, Ser 1.17; Potassium 4.1; Sodium 137 12/22/2017: ALT 17   Recent Lipid Panel Lab Results  Component Value Date/Time   CHOL 133 12/22/2017 08:02 AM   TRIG 64 12/22/2017 08:02 AM   HDL 52 12/22/2017 08:02 AM   CHOLHDL 3.0 09/14/2017 03:03 AM   LDLCALC 68 12/22/2017 08:02 AM    Wt Readings from Last 3 Encounters:  06/08/18 245 lb (111.1 kg)  03/12/18 245 lb (111.1 kg)  12/04/17 235 lb (106.6 kg)     Objective:    Vital Signs:  Ht 5\' 9"  (1.753 m)   Wt 245 lb (111.1 kg)   BMI 36.18 kg/m    VITAL SIGNS:  reviewed  No acute distress Normal affect Answers questions appropriately Remainder of physical exam not performed (telehealth visit; coronavirus pandemic)  ASSESSMENT & PLAN:    1. Coronary artery disease-patient denies chest pain.  Continue aspirin and statin.  We will discontinue Brilinta August 2020. 2. Hyperlipidemia-continue statin. 3. Hypertension-patient's blood pressure is controlled by history.  Continue present medications and follow.  COVID-19 Education: The importance of social distancing was discussed today.  Time:   Today, I have spent 15 minutes with the patient with telehealth technology discussing the above problems.     Medication Adjustments/Labs and Tests Ordered: Current medicines are reviewed at length with the patient today.  Concerns regarding medicines are outlined above.   Tests Ordered: No orders of the defined types were placed in this encounter.   Medication Changes: No orders of the defined types were placed in this encounter.   Disposition:  Follow up in 6 month(s)  Signed, [name], MD  06/08/2018 11:52 AM    [facility]

## 2018-06-05 ENCOUNTER — Telehealth: Payer: Self-pay | Admitting: Cardiology

## 2018-06-05 NOTE — Telephone Encounter (Signed)
Smartphone, pre reg complete 06/05/18 AF °

## 2018-06-08 ENCOUNTER — Telehealth: Payer: Self-pay | Admitting: *Deleted

## 2018-06-08 ENCOUNTER — Encounter: Payer: Self-pay | Admitting: Cardiology

## 2018-06-08 ENCOUNTER — Telehealth (INDEPENDENT_AMBULATORY_CARE_PROVIDER_SITE_OTHER): Payer: Medicare HMO | Admitting: Cardiology

## 2018-06-08 ENCOUNTER — Telehealth: Payer: Self-pay

## 2018-06-08 VITALS — Ht 69.0 in | Wt 245.0 lb

## 2018-06-08 DIAGNOSIS — I2510 Atherosclerotic heart disease of native coronary artery without angina pectoris: Secondary | ICD-10-CM

## 2018-06-08 DIAGNOSIS — E7800 Pure hypercholesterolemia, unspecified: Secondary | ICD-10-CM

## 2018-06-08 DIAGNOSIS — I10 Essential (primary) hypertension: Secondary | ICD-10-CM

## 2018-06-08 NOTE — Telephone Encounter (Signed)
New Message  Patient has appointment for today please call.

## 2018-06-08 NOTE — Telephone Encounter (Signed)
Left voicemail for patient to call office back to pre chart before his 11:20 appt with Dr. Stanford Breed

## 2018-06-08 NOTE — Patient Instructions (Signed)

## 2018-06-08 NOTE — Telephone Encounter (Signed)
This encounter was created in error - please disregard.

## 2018-06-09 NOTE — Telephone Encounter (Signed)
OPEN ERROR

## 2018-06-11 ENCOUNTER — Other Ambulatory Visit: Payer: Self-pay | Admitting: Cardiology

## 2018-06-25 ENCOUNTER — Encounter: Payer: Self-pay | Admitting: *Deleted

## 2018-06-25 DIAGNOSIS — Z006 Encounter for examination for normal comparison and control in clinical research program: Secondary | ICD-10-CM

## 2018-06-25 NOTE — Research (Signed)
[facility] visit 10 phone   Pt doing well, no complaints of cp or sob. Due to number change visit is out of window.                                    "CONSENT"   YES     NO   Continuing further Investigational Product and study visits for follow-up? [x]  []   Continuing consent from future biomedical research [x]  []                                    "EVENTS"    YES     NO  AE   (IF YES SEE SOURCE) []  [x]   SAE  (IF YES SEE SOURCE) []  [x]   ENDPOINT   (IF YES SEE SOURCE) []  [x]   REVASCULARIZATION  (IF YES SEE SOURCE) []  [x]   AMPUTATION   (IF YES SEE SOURCE) []  [x]   TROPONIN'S  (IF YES SEE SOURCE) []  [x] 

## 2018-09-16 ENCOUNTER — Other Ambulatory Visit: Payer: Self-pay | Admitting: Cardiology

## 2018-09-23 ENCOUNTER — Telehealth: Payer: Self-pay | Admitting: *Deleted

## 2018-09-23 NOTE — Telephone Encounter (Signed)
The patient came in as a walk in. He stated that he has been urinating blood since Sunday. His urologsit would like for him to hold the Brilinta until further tests can be done.    Message routed to the provider

## 2018-09-24 NOTE — Telephone Encounter (Signed)
DC brilinta; continue asa 81 mg daily [name]

## 2018-09-24 NOTE — Telephone Encounter (Signed)
Spoke with pt, Aware of dr crenshaw's recommendations.  °

## 2018-09-30 ENCOUNTER — Encounter: Payer: Self-pay | Admitting: *Deleted

## 2018-09-30 DIAGNOSIS — Z006 Encounter for examination for normal comparison and control in clinical research program: Secondary | ICD-10-CM

## 2018-09-30 NOTE — Research (Signed)
V11 Patient doing well, no complaints of cp or sob. He states just staying away from the virus. Only med change is Brillinta was d/c on the 19AUG2020 by Dr Stanford Breed.                                     "CONSENT"   YES     NO   Continuing further Investigational Product and study visits for follow-up? [x]  []   Continuing consent from future biomedical research [x]  []                                    "EVENTS"    YES     NO  AE   (IF YES SEE SOURCE) []  [x]   SAE  (IF YES SEE SOURCE) []  [x]   ENDPOINT   (IF YES SEE SOURCE) []  [x]   REVASCULARIZATION  (IF YES SEE SOURCE) []  [x]   AMPUTATION   (IF YES SEE SOURCE) []  [x]   TROPONIN'S  (IF YES SEE SOURCE) []  [x]     Lifestyle Adherence Assessment:    YES NO  Abstinence from smoking/remaining tobacco free X   Cardiac Diet X   Routine physical activity and/or cardiac rehabilitation X     Current Outpatient Medications:  .  aspirin EC 81 MG tablet, Take 81 mg by mouth daily., Disp: , Rfl:  .  atorvastatin (LIPITOR) 80 MG tablet, TAKE 1 TABLET BY MOUTH ONCE DAILY AT 6PM, Disp: 90 tablet, Rfl: 1 .  Buprenorphine HCl (BELBUCA) 150 MCG FILM, Place inside cheek., Disp: , Rfl:  .  busPIRone (BUSPAR) 10 MG tablet, Take 1 tablet (10 mg total) by mouth at bedtime., Disp: , Rfl:  .  citalopram (CELEXA) 40 MG tablet, Take 40 mg by mouth daily., Disp: , Rfl:  .  docusate sodium (COLACE) 100 MG capsule, Take 100 mg by mouth 2 (two) times daily., Disp: , Rfl:  .  gabapentin (NEURONTIN) 300 MG capsule, Take 1 capsule (300 mg total) by mouth 2 (two) times daily., Disp: 90 capsule, Rfl: 11 .  glipiZIDE (GLUCOTROL XL) 5 MG 24 hr tablet, Take 5 mg by mouth daily., Disp: , Rfl:  .  hydrocortisone (ANUCORT-HC) 25 MG suppository, Place 1 suppository rectally daily as needed for hemorrhoids or itching. , Disp: , Rfl:  .  lisinopril (PRINIVIL,ZESTRIL) 10 MG tablet, Take 1 tablet (10 mg total) by mouth daily., Disp: 30 tablet, Rfl: 4 .  memantine (NAMENDA) 10 MG tablet,  Take 10 mg by mouth 2 (two) times daily., Disp: , Rfl:  .  metFORMIN (GLUCOPHAGE) 500 MG tablet, Take 1 tablet (500 mg total) by mouth 2 (two) times daily with a meal., Disp: 180 tablet, Rfl: 3 .  metoprolol tartrate (LOPRESSOR) 25 MG tablet, TAKE 1/2 (ONE-HALF) TABLET BY MOUTH TWICE DAILY, Disp: 60 tablet, Rfl: 9 .  mirtazapine (REMERON) 30 MG tablet, Take by mouth., Disp: , Rfl:  .  modafinil (PROVIGIL) 100 MG tablet, TAKE 1 2 (ONE HALF) TO 1 TABLET BY MOUTH ONCE DAILY, Disp: , Rfl:  .  mupirocin ointment (BACTROBAN) 2 %, Place 1 application into the nose 2 (two) times daily., Disp: , Rfl:  .  nitroGLYCERIN (NITROSTAT) 0.4 MG SL tablet, Place 1 tablet (0.4 mg total) under the tongue every 5 (five) minutes x 3 doses as needed for chest pain., Disp: 25 tablet, Rfl: 2 .  polyethylene glycol (MIRALAX / GLYCOLAX) packet, Take 17 g by mouth daily., Disp: , Rfl:  .  sildenafil (VIAGRA) 100 MG tablet, Take 100 mg by mouth daily as needed for erectile dysfunction. , Disp: , Rfl:

## 2018-10-13 ENCOUNTER — Encounter (HOSPITAL_BASED_OUTPATIENT_CLINIC_OR_DEPARTMENT_OTHER): Payer: Medicare HMO | Attending: Internal Medicine

## 2018-10-20 ENCOUNTER — Encounter (HOSPITAL_BASED_OUTPATIENT_CLINIC_OR_DEPARTMENT_OTHER): Payer: Medicare HMO | Attending: Internal Medicine

## 2018-10-20 DIAGNOSIS — Z87891 Personal history of nicotine dependence: Secondary | ICD-10-CM | POA: Insufficient documentation

## 2018-10-20 DIAGNOSIS — N3041 Irradiation cystitis with hematuria: Secondary | ICD-10-CM | POA: Diagnosis present

## 2018-10-20 DIAGNOSIS — Z923 Personal history of irradiation: Secondary | ICD-10-CM | POA: Diagnosis not present

## 2018-10-20 DIAGNOSIS — I2510 Atherosclerotic heart disease of native coronary artery without angina pectoris: Secondary | ICD-10-CM | POA: Insufficient documentation

## 2018-10-20 DIAGNOSIS — Z8546 Personal history of malignant neoplasm of prostate: Secondary | ICD-10-CM | POA: Diagnosis not present

## 2018-10-20 DIAGNOSIS — Z86718 Personal history of other venous thrombosis and embolism: Secondary | ICD-10-CM | POA: Diagnosis not present

## 2018-10-20 DIAGNOSIS — Z7902 Long term (current) use of antithrombotics/antiplatelets: Secondary | ICD-10-CM | POA: Insufficient documentation

## 2018-10-20 DIAGNOSIS — I10 Essential (primary) hypertension: Secondary | ICD-10-CM | POA: Diagnosis not present

## 2018-10-20 DIAGNOSIS — Z955 Presence of coronary angioplasty implant and graft: Secondary | ICD-10-CM | POA: Diagnosis not present

## 2018-10-20 DIAGNOSIS — E1140 Type 2 diabetes mellitus with diabetic neuropathy, unspecified: Secondary | ICD-10-CM | POA: Insufficient documentation

## 2018-10-20 DIAGNOSIS — Z7984 Long term (current) use of oral hypoglycemic drugs: Secondary | ICD-10-CM | POA: Diagnosis not present

## 2018-10-21 ENCOUNTER — Other Ambulatory Visit: Payer: Self-pay

## 2018-10-22 ENCOUNTER — Other Ambulatory Visit: Payer: Self-pay

## 2018-10-22 ENCOUNTER — Other Ambulatory Visit (HOSPITAL_COMMUNITY): Payer: Self-pay | Admitting: Internal Medicine

## 2018-10-22 ENCOUNTER — Ambulatory Visit (HOSPITAL_COMMUNITY)
Admission: RE | Admit: 2018-10-22 | Discharge: 2018-10-22 | Disposition: A | Discharge status: Home / Self Care | Payer: Medicare HMO | Source: Ambulatory Visit | Attending: Internal Medicine | Admitting: Internal Medicine

## 2018-10-22 DIAGNOSIS — Z9289 Personal history of other medical treatment: Secondary | ICD-10-CM | POA: Diagnosis present

## 2018-11-16 ENCOUNTER — Other Ambulatory Visit: Payer: Self-pay

## 2018-11-16 ENCOUNTER — Encounter (HOSPITAL_BASED_OUTPATIENT_CLINIC_OR_DEPARTMENT_OTHER): Payer: Medicare HMO | Attending: Internal Medicine | Admitting: Internal Medicine

## 2018-11-16 DIAGNOSIS — N3041 Irradiation cystitis with hematuria: Secondary | ICD-10-CM | POA: Insufficient documentation

## 2018-11-16 DIAGNOSIS — Z8546 Personal history of malignant neoplasm of prostate: Secondary | ICD-10-CM | POA: Insufficient documentation

## 2018-11-16 DIAGNOSIS — E119 Type 2 diabetes mellitus without complications: Secondary | ICD-10-CM | POA: Insufficient documentation

## 2018-11-16 LAB — GLUCOSE, CAPILLARY
Glucose-Capillary: 305 mg/dL — ABNORMAL HIGH (ref 70–99)
Glucose-Capillary: 199 mg/dL — ABNORMAL HIGH (ref 70–99)

## 2018-11-16 NOTE — Progress Notes (Signed)
MACSEN, FEIERTAG ([id number]) Visit Report for 11/16/2018 SuperBill Details Patient Name: Date of Service: [name], [name] 11/16/2018 Medical Record [id number] Patient Account Number: [id number] Date of Birth/Sex: Treating RN: 05-24-1960 (58 y.o. [name] Primary Care Provider: [name] Other Clinician: Referring Provider: Treating Provider/Extender:Abrie Egloff, [name], Alyson Locket in Treatment: 3 Diagnosis Coding ICD-10 Codes Code Description N30.41 Irradiation cystitis with hematuria Z85.46 Personal history of malignant neoplasm of prostate Facility Procedures CPT4 Code Description Modifier Quantity [id number] G0277-(Facility Use Only) HBOT, full body chamber, 85min 4 Physician Procedures CPT4 Code Description Modifier Quantity [id number] - WC PHYS HYPERBARIC OXYGEN THERAPY 1 ICD-10 Diagnosis Description N30.41 Irradiation cystitis with hematuria Electronic Signature(s) Signed: 11/16/2018 3:43:33 PM By: Mikeal Hawthorne EMT/HBOT Signed: 11/16/2018 5:50:55 PM By: Linton Ham MD Entered By: Mikeal Hawthorne on 11/16/2018 10:58:29

## 2018-11-16 NOTE — Progress Notes (Signed)
[name], [name] ([id number]) Visit Report for 11/16/2018 Arrival Information Details Patient Name: Date of Service: [name], [name] 11/16/2018 8:00 AM Medical Record [id number] Patient Account Number: [id number] Date of Birth/Sex: Treating RN: May 27, 1960 (58 y.o. [name] Primary Care Maury Groninger: [name] Other Clinician: Referring Orchid Glassberg: Treating Ayshia Gramlich/Extender:Robson, [name], Alyson Locket in Treatment: 3 Visit Information History Since Last Visit Added or deleted any medications: No Patient Arrived: Ambulatory Any new allergies or adverse reactions: No Arrival Time: 08:00 Had a fall or experienced change in No Accompanied By: self activities of daily living that may affect Transfer Assistance: None risk of falls: Patient Identification Verified: Yes Signs or symptoms of abuse/neglect since last No Secondary Verification Process Yes visito Completed: Hospitalized since last visit: No Patient Requires Transmission-Based No Implantable device outside of the clinic excluding No Precautions: cellular tissue based products placed in the center Patient Has Alerts: No since last visit: Pain Present Now: No Electronic Signature(s) Signed: 11/16/2018 3:43:33 PM By: Mikeal Hawthorne EMT/HBOT Entered By: Mikeal Hawthorne on 11/16/2018 08:40:02 -------------------------------------------------------------------------------- Encounter Discharge Information Details Patient Name: Date of Service: [name], [name] 11/16/2018 8:00 AM Medical Record [id number] Patient Account Number: [id number] Date of Birth/Sex: Treating RN: Jun 16, 1960 (58 y.o. [name] Primary Care Everitt Wenner: [name] Other Clinician: Referring Nilza Eaker: Treating Marquis Down/Extender:Robson, [name], Alyson Locket in Treatment: 3 Encounter Discharge Information Items Discharge Condition: Stable Ambulatory Status: Ambulatory Discharge Destination: Home Transportation:  Private Auto Accompanied By: self Schedule Follow-up Appointment: Yes Clinical Summary of Care: Patient Declined Electronic Signature(s) Signed: 11/16/2018 3:43:33 PM By: Mikeal Hawthorne EMT/HBOT Entered By: Mikeal Hawthorne on 11/16/2018 10:58:49 -------------------------------------------------------------------------------- Patient/Caregiver Education Details Patient Name: [name] 10/12/2020andnbsp8:00 Date of Service: AM Medical Record [id number] Number: Patient Account Number: [id number] Treating RN: Sep 30, 1960 (58 y.o. [name] Date of Birth/Gender: M) Other Clinician: Primary Care Physician: [name] Treating Linton Ham Referring Physician: Physician/Extender: Donnajean Lopes in Treatment: 3 Education Assessment Education Provided To: Patient Education Topics Provided Hyperbaric Oxygenation: Methods: Explain/Verbal Responses: State content correctly Electronic Signature(s) Signed: 11/16/2018 3:43:33 PM By: Mikeal Hawthorne EMT/HBOT Entered By: Mikeal Hawthorne on 11/16/2018 10:58:39 -------------------------------------------------------------------------------- Vitals Details Patient Name: Date of Service: [name], [name] 11/16/2018 8:00 AM Medical Record [id number] Patient Account Number: [id number] Date of Birth/Sex: Treating RN: [birth year] (58 y.o. [name] Primary Care Syra Sirmons: [name] Other Clinician: Referring Dexter Signor: Treating Aliesha Dolata/Extender:Robson, [name], Alyson Locket in Treatment: 3 Vital Signs Time Taken: 08:05 Temperature (F): 98 Height (in): 70 Pulse (bpm): 73 Weight (lbs): 255 Respiratory Rate (breaths/min): 17 Body Mass Index (BMI): 36.6 Blood Pressure (mmHg): 170/86 Capillary Blood Glucose (mg/dl): 305 Reference Range: 80 - 120 mg / dl Electronic Signature(s) Signed: 11/16/2018 3:43:33 PM By: Mikeal Hawthorne EMT/HBOT Entered By: Mikeal Hawthorne on 11/16/2018 08:40:15

## 2018-11-16 NOTE — Progress Notes (Addendum)
AYMEN, PRESZLER ([id number]) Visit Report for 11/16/2018 HBO Details Patient Name: Date of Service: [name], [name] 11/16/2018 8:00 AM Medical Record [id number] Patient Account Number: [id number] Date of Birth/Sex: Treating RN: [birth year] (58 y.o. [name] Primary Care Royce Sciara: [name] Other Clinician: Referring Katheline Brendlinger: Treating Miliani Deike/Extender:Robson, [name], Alyson Locket in Treatment: 3 HBO Treatment Course Details Treatment Course Number: 1 Ordering Angle Dirusso: Linton Ham Total Treatments Ordered: 40 HBO Treatment Start Date: 11/16/2018 HBO Indication: Late Effect of Radiation HBO Treatment Details Treatment Number: 1 Patient Type: Outpatient Chamber Type: Monoplace Chamber Serial #: [id number] Treatment Protocol: 2.5 ATA with 90 minutes oxygen, with two 5 minute air breaks Treatment Details Compression Rate Down: 1.5 psi / minute De-Compression Rate Up: 2.0 psi / minute Air breaks and CompressTx Pressure breathing periods DecompressDecompress Begins Reached (leave unused spaces Begins Ends blank) Chamber Pressure (ATA)1 2.5 2.5 2.5 2.5 2.5 --2.5 1 Clock Time (24 hr) 08:25 08:40 [id number] 10:32 Treatment Length: 127 (minutes) Treatment Segments: 4 Vital Signs Capillary Blood Glucose Reference Range: 80 - 120 mg / dl HBO Diabetic Blood Glucose Intervention Range: <131 mg/dl or >249 mg/dl Time Vitals Blood Respiratory Capillary Blood Glucose Pulse Action Type: Pulse: Temperature: Taken: Pressure: Rate: Glucose (mg/dl): Meter #: Oximetry (%) Taken: Pre 08:05 170/86 73 17 98 305 Post 10:35 115/83 68 15 98.5 199 Treatment Response Treatment Toleration: Well Treatment Completion Treatment Completed without Adverse Event Status: [name] Notes Patient's first treatment. He has some discomfort on his sent in the right ear. Inferior rim of the tympanic membrane looks somewhat irritated we will check this tomorrow.  Otherwise dive was treated fairly well. Cardio and respiratory exams were normal Physician HBO Attestation: I certify that I supervised this HBO treatment in accordance with Medicare guidelines. A trained Yes emergency response team is readily available per hospital policies and procedures. Continue HBOT as ordered. Yes Electronic Signature(s) Signed: 11/16/2018 5:50:55 PM By: Linton Ham MD Previous Signature: 11/16/2018 3:43:33 PM Version By: Mikeal Hawthorne EMT/HBOT Entered By: Linton Ham on 11/16/2018 17:42:18 -------------------------------------------------------------------------------- HBO Safety Checklist Details Patient Name: Date of Service: [name], [name] 11/16/2018 8:00 AM Medical Record [id number] Patient Account Number: [id number] Date of Birth/Sex: Treating RN: Jun 16, 1960 (58 y.o. [name] Primary Care Fallynn Gravett: [name] Other Clinician: Referring Dulcy Sida: Treating Mishawn Hemann/Extender:Robson, [name], Alyson Locket in Treatment: 3 HBO Safety Checklist Items Safety Checklist Consent Form Signed Patient voided / foley secured and emptied When did you last eato n/a Last dose of injectable or oral agent metformin NA Ostomy pouch emptied and vented if applicable NA All implantable devices assessed, documented and approved NA Intravenous access site secured and place Valuables secured Linens and cotton and cotton/polyester blend (less than 51% polyester) Personal oil-based products / skin lotions / body lotions removed NA Wigs or hairpieces removed NA Smoking or tobacco materials removed Books / newspapers / magazines / loose paper removed Cologne, aftershave, perfume and deodorant removed Jewelry removed (may wrap wedding band) NA Make-up removed Hair care products removed NA Battery operated devices (external) removed NA Heating patches and chemical warmers removed NA Titanium eyewear removed NA Nail polish cured greater than 10  hours NA Casting material cured greater than 10 hours NA Hearing aids removed Loose dentures or partials removed NA Prosthetics have been removed Patient demonstrates correct use of air break device (if applicable) Patient concerns have been addressed Patient grounding bracelet on and cord attached to chamber Specifics for Inpatients (complete in addition to above) Medication sheet sent with patient Intravenous medications needed  or due during therapy sent with patient Drainage tubes (e.g. nasogastric tube or chest tube secured and vented) Endotracheal or Tracheotomy tube secured Cuff deflated of air and inflated with saline Airway suctioned Electronic Signature(s) Signed: 11/26/2018 8:40:17 AM By: Mikeal Hawthorne EMT/HBOT Previous Signature: 11/24/2018 1:27:32 PM Version By: Mikeal Hawthorne EMT/HBOT Previous Signature: 11/16/2018 8:41:54 AM Version By: Mikeal Hawthorne EMT/HBOT Entered By: Mikeal Hawthorne on 11/26/2018 08:40:17

## 2018-11-17 ENCOUNTER — Encounter (HOSPITAL_BASED_OUTPATIENT_CLINIC_OR_DEPARTMENT_OTHER): Payer: Medicare HMO | Admitting: Internal Medicine

## 2018-11-17 DIAGNOSIS — N3041 Irradiation cystitis with hematuria: Secondary | ICD-10-CM | POA: Diagnosis not present

## 2018-11-17 LAB — GLUCOSE, CAPILLARY
Glucose-Capillary: 182 mg/dL — ABNORMAL HIGH (ref 70–99)
Glucose-Capillary: 128 mg/dL — ABNORMAL HIGH (ref 70–99)

## 2018-11-17 NOTE — Progress Notes (Addendum)
[name], [name] ([id number]) Visit Report for 11/17/2018 HBO Details Patient Name: Date of Service: [name], [name] 11/17/2018 8:00 AM Medical Record [id number] Patient Account Number: [id number] Date of Birth/Sex: Treating RN: 1960/05/19 (58 y.o. [name]) Carlene Coria Primary Care Saysha [name]: [name] Other Clinician: Referring [name]: Treating [name]/Extender:Robson, [name], Alyson [name] HBO Treatment Course Details Treatment Course Number: 1 Ordering Martyna [name]: Linton Ham Total Treatments Ordered: 40 HBO Treatment Start Date: 11/16/2018 HBO Indication: Late Effect of Radiation HBO Treatment Details Treatment Number: 2 Patient Type: Outpatient Chamber Type: Monoplace Chamber Serial #: [id number] Treatment Protocol: 2.5 ATA with 90 minutes oxygen, with two 5 minute air breaks Treatment Details Compression Rate Down: 1.5 psi / minute De-Compression Rate Up: 2.0 psi / minute Air breaks and CompressTx Pressure breathing periods DecompressDecompress Begins Reached (leave unused spaces Begins Ends blank) Chamber Pressure (ATA)1 2.5 2.5 2.5 2.5 2.5 --2.5 1 Clock Time (24 hr) 08:15 08:30 [id number]:0009:[id number]:[id number]:40--10:10 10:22 Treatment Length: 127 (minutes) Treatment Segments: 4 Vital Signs Capillary Blood Glucose Reference Range: 80 - 120 mg / dl HBO Diabetic Blood Glucose Intervention Range: <131 mg/dl or >249 mg/dl Time Vitals Blood Respiratory Capillary Blood Glucose Pulse Action Type: Pulse: Temperature: Taken: Pressure: Rate: Glucose (mg/dl): Meter #: Oximetry (%) Taken: Pre 08:05 153/90 68 17 98.2 182 Post 10:25 135/89 65 15 98 128 Treatment Response Treatment Toleration: Well Treatment Completion Treatment Completed without Adverse Event Status: [name] Notes No concerns with treatment given Physician HBO Attestation: I certify that I supervised this HBO treatment in accordance with Medicare guidelines. A trained Yes Yes emergency  response team is readily available per hospital policies and procedures. Continue HBOT as ordered. Yes Electronic Signature(s) Signed: 11/17/2018 5:42:33 PM By: Linton Ham MD Entered By: Linton Ham on 11/17/2018 17:36:09 -------------------------------------------------------------------------------- HBO Safety Checklist Details Patient Name: Date of Service: [name], [name] 11/17/2018 8:00 AM Medical Record [id number] Patient Account Number: [id number] Date of Birth/Sex: Treating RN: 02/18/1960 (58 y.o. [name]) Carlene Coria Primary Care [name]: [name] Other Clinician: Referring [place] Rico: Treating [name]/Extender:Robson, [name], Alyson [name] HBO Safety Checklist Items Safety Checklist Consent Form Signed Patient voided / foley secured and emptied When did you last eato 0700 - boiled egg/tea Last dose of injectable or oral agent metformin NA Ostomy pouch emptied and vented if applicable NA All implantable devices assessed, documented and approved NA Intravenous access site secured and place Valuables secured Linens and cotton and cotton/polyester blend (less than 51% polyester) Personal oil-based products / skin lotions / body lotions removed NA Wigs or hairpieces removed NA Smoking or tobacco materials removed Books / newspapers / magazines / loose paper removed Cologne, aftershave, perfume and deodorant removed Jewelry removed (may wrap wedding band) NA Make-up removed Hair care products removed NA Battery operated devices (external) removed NA Heating patches and chemical warmers removed NA Titanium eyewear removed NA Nail polish cured greater than 10 hours NA Casting material cured greater than 10 hours NA Hearing aids removed Loose dentures or partials removed NA Prosthetics have been removed Patient demonstrates correct use of air break device (if applicable) Patient concerns have been addressed Patient grounding bracelet  on and cord attached to chamber Specifics for Inpatients (complete in addition to above) Medication sheet sent with patient Intravenous medications needed or due during therapy sent with patient Drainage tubes (e.g. nasogastric tube or chest tube secured and vented) Endotracheal or Tracheotomy tube secured Cuff deflated of air and inflated with saline Airway suctioned Electronic Signature(s) Signed: 11/26/2018 8:39:28 AM By: Ronnald Ramp,  Dedrick EMT/HBOT Previous Signature: 11/24/2018 1:29:35 PM Version By: Mikeal Hawthorne EMT/HBOT Previous Signature: 11/17/2018 8:41:22 AM Version By: Mikeal Hawthorne EMT/HBOT Entered By: Mikeal Hawthorne on 11/26/2018 [id number]

## 2018-11-18 ENCOUNTER — Other Ambulatory Visit: Payer: Self-pay

## 2018-11-18 ENCOUNTER — Encounter (HOSPITAL_BASED_OUTPATIENT_CLINIC_OR_DEPARTMENT_OTHER): Payer: Medicare HMO | Admitting: Physician Assistant

## 2018-11-18 DIAGNOSIS — N3041 Irradiation cystitis with hematuria: Secondary | ICD-10-CM | POA: Diagnosis not present

## 2018-11-18 LAB — GLUCOSE, CAPILLARY
Glucose-Capillary: 136 mg/dL — ABNORMAL HIGH (ref 70–99)
Glucose-Capillary: 105 mg/dL — ABNORMAL HIGH (ref 70–99)

## 2018-11-18 NOTE — Progress Notes (Signed)
JANCARLO, STAMPLEY ([id number]) Visit Report for 11/18/2018 Problem List Details Patient Name: Date of Service: YOANDRY, MOUNGER 11/18/2018 8:00 AM Medical Record [id number] Patient Account Number: [id number] Date of Birth/Sex: Treating RN: 1960/04/28 (58 y.o. [name] Primary Care Provider: [name] Other Clinician: Referring Provider: Treating Provider/Extender:Stone III, [name], [name] Weeks in Treatment: 4 Active Problems ICD-10 Evaluated Encounter Code Description Active Date Today Diagnosis N30.41 Irradiation cystitis with hematuria 10/20/2018 No Yes Z85.46 Personal history of malignant neoplasm of prostate 10/20/2018 No Yes Inactive Problems Resolved Problems Electronic Signature(s) Signed: 11/18/2018 6:05:26 PM By: [name] PA-C Entered By: [name] on 11/18/2018 18:03:18 -------------------------------------------------------------------------------- SuperBill Details Patient Name: Date of Service: ADMA, GAERTNER 11/18/2018 Medical Record [id number] Patient Account Number: [id number] Date of Birth/Sex: Treating RN: 1960-09-29 (58 y.o. [name] Primary Care Provider: [name] Other Clinician: Referring Provider: Treating Provider/Extender:Stone III, [name], [name] Weeks in Treatment: 4 Diagnosis Coding ICD-10 Codes Code Description N30.41 Irradiation cystitis with hematuria Z85.46 Personal history of malignant neoplasm of prostate Facility Procedures CPT4 Code Description: [id number] G0277-(Facility Use Only) HBOT, full body chamber, 22min Modifier: Quantity: 4 Physician Procedures CPT4 Code Description: [id number] - WC PHYS HYPERBARIC OXYGEN THERAPY ICD-10 Diagnosis Description N30.41 Irradiation cystitis with hematuria Modifier: Quantity: 1 Electronic Signature(s) Signed: 11/18/2018 6:05:26 PM By: [name] PA-C Entered By: [name] on 11/18/2018 18:03:16

## 2018-11-18 NOTE — Progress Notes (Addendum)
[name], [name] ([id number]) Visit Report for 11/18/2018 HBO Details Patient Name: Date of Service: [name] 11/18/2018 8:00 AM Medical Record [id number] Patient Account Number: [id number] Date of Birth/Sex: Treating RN: [birth year] (58 y.o. [name] Primary Care Basha Krygier: [name] Other Clinician: Referring Newt Levingston: Treating Rashay Barnette/Extender:Stone III, [name], [name] Weeks in Treatment: 4 HBO Treatment Course Details Treatment Course Number: 1 Ordering Timm Bonenberger: Linton Ham Total Treatments Ordered: 40 HBO Treatment Start Date: 11/16/2018 HBO Indication: Late Effect of Radiation HBO Treatment Details Treatment Number: 3 Patient Type: Outpatient Chamber Type: Monoplace Chamber Serial #: [id number] Treatment Protocol: 2.5 ATA with 90 minutes oxygen, with two 5 minute air breaks Treatment Details Compression Rate Down: 1.5 psi / minute De-Compression Rate Up: 2.0 psi / minute Air breaks and CompressTx Pressure breathing periods DecompressDecompress Begins Reached (leave unused spaces Begins Ends blank) Chamber Pressure (ATA)1 2.5 2.5 2.5 2.5 2.5 --2.5 1 Clock Time (24 hr) 08:07 08:22 [id number] 10:14 Treatment Length: 127 (minutes) Treatment Segments: 4 Vital Signs Capillary Blood Glucose Reference Range: 80 - 120 mg / dl HBO Diabetic Blood Glucose Intervention Range: <131 mg/dl or >249 mg/dl Time Vitals Blood Respiratory Capillary Blood Glucose Pulse Action Type: Pulse: Temperature: Taken: Pressure: Rate: Glucose (mg/dl): Meter #: Oximetry (%) Taken: Pre 08:00 157/96 71 17 98 136 Post 10:15 157/98 64 15 98 105 Treatment Response Treatment Toleration: Well Treatment Completion Treatment Completed without Adverse Event Status: Physician HBO Attestation: I certify that I supervised this HBO treatment in accordance with Medicare guidelines. A trained Yes emergency response team is readily available per hospital policies  and procedures. Continue HBOT as ordered. Yes Electronic Signature(s) Signed: 11/18/2018 6:05:26 PM By: [name] PA-C Entered By: [name] on 11/18/2018 18:03:14 -------------------------------------------------------------------------------- HBO Safety Checklist Details Patient Name: Date of Service: [name] 11/18/2018 8:00 AM Medical Record [id number] Patient Account Number: [id number] Date of Birth/Sex: Treating RN: 1960-12-20 (58 y.o. [name] Primary Care Shantavia Jha: [name] Other Clinician: Referring Horace Wishon: Treating Euriah Matlack/Extender:Stone III, [name], [name] Weeks in Treatment: 4 HBO Safety Checklist Items Safety Checklist Consent Form Signed Patient voided / foley secured and emptied When did you last eato 0700 - boiled egg/tea Last dose of injectable or oral agent metformin NA Ostomy pouch emptied and vented if applicable NA All implantable devices assessed, documented and approved NA Intravenous access site secured and place Valuables secured Linens and cotton and cotton/polyester blend (less than 51% polyester) Personal oil-based products / skin lotions / body lotions removed NA Wigs or hairpieces removed NA Smoking or tobacco materials removed Books / newspapers / magazines / loose paper removed Cologne, aftershave, perfume and deodorant removed Jewelry removed (may wrap wedding band) NA Make-up removed Hair care products removed NA Battery operated devices (external) removed NA Heating patches and chemical warmers removed NA Titanium eyewear removed NA Nail polish cured greater than 10 hours NA Casting material cured greater than 10 hours NA Hearing aids removed Loose dentures or partials removed NA Prosthetics have been removed Patient demonstrates correct use of air break device (if applicable) Patient concerns have been addressed Patient grounding bracelet on and cord attached to chamber Specifics for  Inpatients (complete in addition to above) Medication sheet sent with patient Intravenous medications needed or due during therapy sent with patient Drainage tubes (e.g. nasogastric tube or chest tube secured and vented) Endotracheal or Tracheotomy tube secured Cuff deflated of air and inflated with saline Airway suctioned Electronic Signature(s) Signed: 11/26/2018 8:39:06 AM By: Mikeal Hawthorne EMT/HBOT Previous Signature:  11/24/2018 1:30:39 PM Version By: Mikeal Hawthorne EMT/HBOT Previous Signature: 11/18/2018 8:38:06 AM Version By: Mikeal Hawthorne EMT/HBOT Entered By: Mikeal Hawthorne on 11/26/2018 08:39:06

## 2018-11-19 ENCOUNTER — Other Ambulatory Visit: Payer: Self-pay

## 2018-11-19 ENCOUNTER — Encounter (HOSPITAL_BASED_OUTPATIENT_CLINIC_OR_DEPARTMENT_OTHER): Payer: Medicare HMO | Admitting: Internal Medicine

## 2018-11-19 DIAGNOSIS — N3041 Irradiation cystitis with hematuria: Secondary | ICD-10-CM | POA: Diagnosis not present

## 2018-11-19 LAB — GLUCOSE, CAPILLARY
Glucose-Capillary: 204 mg/dL — ABNORMAL HIGH (ref 70–99)
Glucose-Capillary: 147 mg/dL — ABNORMAL HIGH (ref 70–99)

## 2018-11-19 NOTE — Progress Notes (Addendum)
LAMIN, MCQUEARY ([id number]) Visit Report for 11/19/2018 HBO Details Patient Name: Date of Service: [name], [name] 11/19/2018 8:00 AM Medical Record [id number] Patient Account Number: [id number] Date of Birth/Sex: Treating RN: October 19, 1960 (58 y.o. [name] Primary Care Griffin Gerrard: [name] Other Clinician: Referring Belvin Gauss: Treating Dorothymae Maciver/Extender:Robson, [name], Alyson [name] HBO Treatment Course Details Treatment Course Number: 1 Ordering Cypher Paule: Linton Ham Total Treatments Ordered: 40 HBO Treatment Start Date: 11/16/2018 HBO Indication: Late Effect of Radiation HBO Treatment Details Treatment Number: 4 Patient Type: Outpatient Chamber Type: Monoplace Chamber Serial #: [id number] Treatment Protocol: 2.5 ATA with 90 minutes oxygen, with two 5 minute air breaks Treatment Details Compression Rate Down: 2.0 psi / minute De-Compression Rate Up: 2.0 psi / minute Air breaks and CompressTx Pressure breathing periods DecompressDecompress Begins Reached (leave unused spaces Begins Ends blank) Chamber Pressure (ATA)1 2.5 2.5 2.5 2.5 2.5 --2.5 1 Clock Time (24 hr) 08:04 08:16 [id number] 10:08 Treatment Length: 124 (minutes) Treatment Segments: 4 Vital Signs Capillary Blood Glucose Reference Range: 80 - 120 mg / dl HBO Diabetic Blood Glucose Intervention Range: <131 mg/dl or >249 mg/dl Time Vitals Blood Respiratory Capillary Blood Glucose Pulse Action Type: Pulse: Temperature: Taken: Pressure: Rate: Glucose (mg/dl): Meter #: Oximetry (%) Taken: Pre 08:00 161/94 68 17 98.3 204 Post 10:10 131/86 62 18 98 147 Treatment Response Treatment Toleration: Well Treatment Completion Treatment Completed without Adverse Event Status: [name] Notes No concerns with treatment given Physician HBO Attestation: I certify that I supervised this HBO treatment in accordance with Medicare guidelines. A trained Yes Yes emergency  response team is readily available per hospital policies and procedures. Continue HBOT as ordered. Yes Electronic Signature(s) Signed: 11/19/2018 5:35:18 PM By: Linton Ham MD Entered By: Linton Ham on 11/19/2018 16:52:35 -------------------------------------------------------------------------------- HBO Safety Checklist Details Patient Name: Date of Service: [name], [name] 11/19/2018 8:00 AM Medical Record [id number] Patient Account Number: [id number] Date of Birth/Sex: Treating RN: 1961/01/31 (57 y.o. [name] Primary Care Zakhia Seres: [name] Other Clinician: Referring Catia Todorov: Treating Dayle Mcnerney/Extender:Robson, [name], Alyson [name] HBO Safety Checklist Items Safety Checklist Consent Form Signed Patient voided / foley secured and emptied When did you last eato 0700 - milk Last dose of injectable or oral agent metformin NA Ostomy pouch emptied and vented if applicable NA All implantable devices assessed, documented and approved NA Intravenous access site secured and place Valuables secured Linens and cotton and cotton/polyester blend (less than 51% polyester) Personal oil-based products / skin lotions / body lotions removed NA Wigs or hairpieces removed NA Smoking or tobacco materials removed Books / newspapers / magazines / loose paper removed Cologne, aftershave, perfume and deodorant removed Jewelry removed (may wrap wedding band) NA Make-up removed Hair care products removed NA Battery operated devices (external) removed NA Heating patches and chemical warmers removed NA Titanium eyewear removed NA Nail polish cured greater than 10 hours NA Casting material cured greater than 10 hours NA Hearing aids removed Loose dentures or partials removed NA Prosthetics have been removed Patient demonstrates correct use of air break device (if applicable) Patient concerns have been addressed Patient grounding bracelet on and  cord attached to chamber Specifics for Inpatients (complete in addition to above) Medication sheet sent with patient Intravenous medications needed or due during therapy sent with patient Drainage tubes (e.g. nasogastric tube or chest tube secured and vented) Endotracheal or Tracheotomy tube secured Cuff deflated of air and inflated with saline Airway suctioned Electronic Signature(s) Signed: 11/26/2018 8:38:43 AM By: Mikeal Hawthorne  EMT/HBOT Previous Signature: 11/24/2018 1:31:10 PM Version By: Mikeal Hawthorne EMT/HBOT Previous Signature: 11/19/2018 8:11:31 AM Version By: Mikeal Hawthorne EMT/HBOT Entered By: Mikeal Hawthorne on 11/26/2018 08:38:43

## 2018-11-20 ENCOUNTER — Encounter (HOSPITAL_BASED_OUTPATIENT_CLINIC_OR_DEPARTMENT_OTHER): Payer: Medicare HMO | Admitting: Internal Medicine

## 2018-11-20 DIAGNOSIS — N3041 Irradiation cystitis with hematuria: Secondary | ICD-10-CM | POA: Diagnosis not present

## 2018-11-23 ENCOUNTER — Encounter (HOSPITAL_BASED_OUTPATIENT_CLINIC_OR_DEPARTMENT_OTHER): Payer: Medicare HMO | Admitting: Internal Medicine

## 2018-11-23 ENCOUNTER — Other Ambulatory Visit: Payer: Self-pay

## 2018-11-23 DIAGNOSIS — N3041 Irradiation cystitis with hematuria: Secondary | ICD-10-CM | POA: Diagnosis not present

## 2018-11-23 LAB — GLUCOSE, CAPILLARY
Glucose-Capillary: 223 mg/dL — ABNORMAL HIGH (ref 70–99)
Glucose-Capillary: 145 mg/dL — ABNORMAL HIGH (ref 70–99)

## 2018-11-23 NOTE — Progress Notes (Signed)
DEMAURI, KAWADA ([id number]) Visit Report for 11/20/2018 Arrival Information Details Patient Name: Date of Service: LINNIE, MCCLAY 11/20/2018 8:00 AM Medical Record [id number] Patient Account Number: [id number] Date of Birth/Sex: Treating RN: 10-Sep-1960 (58 y.o. [name] Primary Care Salah Nakamura: [name] Other Clinician: Maye Hides Referring Erikson Danzy: Treating Alexias Margerum/Extender:Robson, [name], Alyson [name] Visit Information History Since Last Visit All ordered tests and consults were completed: Yes Patient Arrived: Ambulatory Added or deleted any medications: No Arrival Time: 08:05 Any new allergies or adverse reactions: No Accompanied By: self Had a fall or experienced change in No Transfer Assistance: None activities of daily living that may affect Patient Identification Verified: Yes risk of falls: Secondary Verification Process Yes Signs or symptoms of abuse/neglect since last No Completed: visito Patient Requires Transmission-Based No Hospitalized since last visit: No Precautions: Implantable device outside of the clinic excluding No Patient Has Alerts: No cellular tissue based products placed in the center since last visit: Pain Present Now: No Electronic Signature(s) Signed: 11/23/2018 11:03:58 AM By: Maye Hides RCP Entered By: Maye Hides on 11/20/2018 09:08:36 -------------------------------------------------------------------------------- Vitals Details Patient Name: Date of Service: TREYDEN, ARNESEN 11/20/2018 8:00 AM Medical Record [id number] Patient Account Number: [id number] Date of Birth/Sex: Treating RN: 1960-12-07 (58 y.o. [name] Primary Care Rynlee Lisbon: [name] Other Clinician: Maye Hides Referring Elky Funches: Treating Arav Bannister/Extender:Robson, [name], Alyson [name] Vital Signs Time Taken: 08:05 Temperature (F): 97.9 Height (in): 70 Pulse (bpm):  76 Weight (lbs): 255 Respiratory Rate (breaths/min): 18 Body Mass Index (BMI): 36.6 Blood Pressure (mmHg): 151/78 Capillary Blood Glucose (mg/dl): 156 Reference Range: 80 - 120 mg / dl Electronic Signature(s) Signed: 11/23/2018 11:03:58 AM By: Maye Hides RCP Entered By: Maye Hides on 11/20/2018 09:09:43

## 2018-11-23 NOTE — Progress Notes (Addendum)
[name], [name] ([id number]) Visit Report for 11/20/2018 HBO Details Patient Name: Date of Service: [name], [name] 11/20/2018 8:00 AM Medical Record [id number] Patient Account Number: [id number] Date of Birth/Sex: Treating RN: 03-04-1960 (58 y.o. [name] Primary Care Emer Onnen: [name] Other Clinician: Maye Hides Referring Haislee Corso: Treating Azaylia Fong/Extender:Robson, [name], Alyson [name] HBO Treatment Course Details Treatment Course Number: 1 Ordering Killian Ress: Linton Ham Total Treatments Ordered: 40 HBO Treatment Start Date: 11/16/2018 HBO Indication: Late Effect of Radiation HBO Treatment Details Treatment Number: 5 Patient Type: Outpatient Chamber Type: Monoplace Chamber Serial #: [id number] Treatment Protocol: 2.5 ATA with 90 minutes oxygen, with two 5 minute air breaks Treatment Details Compression Rate Down: 2.0 psi / minute De-Compression Rate Up: 2.5 psi / minute Air breaks and CompressTx Pressure breathing periods DecompressDecompress Begins Reached (leave unused spaces Begins Ends blank) Chamber Pressure (ATA)1 2.5 2.5 2.5 2.5 2.5 --2.5 1 Clock Time (24 hr) 08:10 08:21 [id number] 10:09 Treatment Length: 119 (minutes) Treatment Segments: 4 Vital Signs Capillary Blood Glucose Reference Range: 80 - 120 mg / dl HBO Diabetic Blood Glucose Intervention Range: <131 mg/dl or >249 mg/dl Time Vitals Blood Respiratory Capillary Blood Glucose Pulse Action Type: Pulse: Temperature: Taken: Pressure: Rate: Glucose (mg/dl): Meter #: Oximetry (%) Taken: Pre 08:05 151/78 76 18 97.9 156 Post 10:20 117/75 65 16 97.5 117 100 Treatment Response Treatment Toleration: Well Treatment Completion Treatment Completed without Adverse Event Status: Treatment Notes Pt completed and uneventful. Pt stayed awake watching TV though-out treatment. Kadeja Granada Notes No concerns with treatment given Physician HBO  Attestation: I certify that I supervised this HBO treatment in accordance with Medicare guidelines. A trained Yes emergency response team is readily available per hospital policies and procedures. Continue HBOT as ordered. Yes Electronic Signature(s) Signed: 11/20/2018 5:33:39 PM By: Linton Ham MD Entered By: Linton Ham on 11/20/2018 16:52:03 -------------------------------------------------------------------------------- HBO Safety Checklist Details Patient Name: Date of Service: [name], [name] 11/20/2018 8:00 AM Medical Record [id number] Patient Account Number: [id number] Date of Birth/Sex: Treating RN: [birth year] (58 y.o. [name] Primary Care Aaric Dolph: [name] Other Clinician: Maye Hides Referring Gabrielle Mester: Treating Vasiliy Mccarry/Extender:Robson, [name], Alyson [name] HBO Safety Checklist Items Safety Checklist Consent Form Signed Patient voided / foley secured and emptied When did you last eato breakfast. milk and toast Last dose of injectable or oral agent metformin NA Ostomy pouch emptied and vented if applicable NA All implantable devices assessed, documented and approved NA Intravenous access site secured and place Valuables secured Linens and cotton and cotton/polyester blend (less than 51% polyester) Personal oil-based products / skin lotions / body lotions removed NA Wigs or hairpieces removed Smoking or tobacco materials removed Books / newspapers / magazines / loose paper removed Cologne, aftershave, perfume and deodorant removed Jewelry removed (may wrap wedding band) NA Make-up removed NA Hair care products removed Battery operated devices (external) removed Heating patches and chemical warmers removed NA Titanium eyewear removed NA Nail polish cured greater than 10 hours NA Casting material cured greater than 10 hours NA Hearing aids removed Loose dentures or partials removed NA Prosthetics have been  removed Patient demonstrates correct use of air break device (if applicable) Patient concerns have been addressed Patient grounding bracelet on and cord attached to chamber Specifics for Inpatients (complete in addition to above) Medication sheet sent with patient Intravenous medications needed or due during therapy sent with patient Drainage tubes (e.g. nasogastric tube or chest tube secured and vented) Endotracheal or Tracheotomy tube secured Cuff deflated of  air and inflated with saline Airway suctioned Electronic Signature(s) Signed: 11/26/2018 8:38:23 AM By: Mikeal Hawthorne EMT/HBOT Previous Signature: 11/23/2018 11:03:58 AM Version By: Maye Hides RCP Entered By: Mikeal Hawthorne on 11/26/2018 08:38:22

## 2018-11-23 NOTE — Progress Notes (Signed)
PEREZ, MICKELS ([id number]) Visit Report for 11/20/2018 SuperBill Details Patient Name: Date of Service: [name], [name] 11/20/2018 Medical Record [id number] Patient Account Number: [id number] Date of Birth/Sex: Treating RN: 1960/07/13 (58 y.o. [name] Primary Care Provider: [name] Other Clinician: Referring Provider: Treating Provider/Extender:Robson, [name], Alyson [name] Diagnosis Coding ICD-10 Codes Code Description N30.41 Irradiation cystitis with hematuria Z85.46 Personal history of malignant neoplasm of prostate Facility Procedures CPT4 Code Description Modifier Quantity [id number] G0277-(Facility Use Only) HBOT, full body chamber, 38min 4 ICD-10 Diagnosis Description N30.41 Irradiation cystitis with hematuria Z85.46 Personal history of malignant neoplasm of prostate Physician Procedures CPT4 Code Description Modifier Quantity [id number] - WC PHYS HYPERBARIC OXYGEN THERAPY 1 ICD-10 Diagnosis Description N30.41 Irradiation cystitis with hematuria Z85.46 Personal history of malignant neoplasm of prostate Electronic Signature(s) Signed: 11/20/2018 5:33:39 PM By: Linton Ham MD Signed: 11/23/2018 11:03:58 AM By: Maye Hides RCP Entered By: Maye Hides on 11/20/2018 11:07:08

## 2018-11-23 NOTE — Progress Notes (Addendum)
[name], [name] ([id number]) Visit Report for 11/23/2018 HBO Details Patient Name: Date of Service: [name], [name] 11/23/2018 8:00 AM Medical Record [id number] Patient Account Number: [id number] Date of Birth/Sex: Treating RN: 1960-11-30 (58 y.o. [name] Primary Care Lenora Gomes: [name] Other Clinician: Referring Bartt Gonzaga: Treating Anetha Slagel/Extender:Robson, [name], Alyson [name] HBO Treatment Course Details Treatment Course Number: 1 Ordering Strummer Canipe: Linton Ham Total Treatments Ordered: 40 HBO Treatment Start Date: 11/16/2018 HBO Indication: Late Effect of Radiation HBO Treatment Details Treatment Number: 6 Patient Type: Outpatient Chamber Type: Monoplace Chamber Serial #: [id number] Treatment Protocol: 2.5 ATA with 90 minutes oxygen, with two 5 minute air breaks Treatment Details Compression Rate Down: 2.0 psi / minute De-Compression Rate Up: 2.0 psi / minute Air breaks and CompressTx Pressure breathing periods DecompressDecompress Begins Reached (leave unused spaces Begins Ends blank) Chamber Pressure (ATA)1 2.5 2.5 2.5 2.5 2.5 --2.5 1 Clock Time (24 hr) 08:20 08:32 [id number] 10:24 Treatment Length: 124 (minutes) Treatment Segments: 4 Vital Signs Capillary Blood Glucose Reference Range: 80 - 120 mg / dl HBO Diabetic Blood Glucose Intervention Range: <131 mg/dl or >249 mg/dl Time Vitals Blood Respiratory Capillary Blood Glucose Pulse Action Type: Pulse: Temperature: Taken: Pressure: Rate: Glucose (mg/dl): Meter #: Oximetry (%) Taken: Pre 08:10 162/91 74 16 97.8 223 Post 10:27 147/83 66 14 98.1 145 Treatment Response Treatment Toleration: Well Treatment Completion Treatment Completed without Adverse Event Status: Konica Stankowski Notes No concerns with treatment given Physician HBO Attestation: I certify that I supervised this HBO treatment in accordance with Medicare guidelines. A  trained Yes Yes emergency response team is readily available per hospital policies and procedures. Continue HBOT as ordered. Yes Electronic Signature(s) Signed: 11/23/2018 5:54:03 PM By: Linton Ham MD Entered By: Linton Ham on 11/23/2018 17:51:28 -------------------------------------------------------------------------------- HBO Safety Checklist Details Patient Name: Date of Service: [name], [name] 11/23/2018 8:00 AM Medical Record [id number] Patient Account Number: [id number] Date of Birth/Sex: Treating RN: 1960/10/02 (58 y.o. [name] Primary Care Courney Garrod: [name] Other Clinician: Referring Jeno Calleros: Treating Jessabelle Markiewicz/Extender:Robson, [name], Alyson [name] HBO Safety Checklist Items Safety Checklist Consent Form Signed Patient voided / foley secured and emptied When did you last eato 0700 - chicken biscuit/milk Last dose of injectable or oral agent metformin NA Ostomy pouch emptied and vented if applicable NA All implantable devices assessed, documented and approved NA Intravenous access site secured and place Valuables secured Linens and cotton and cotton/polyester blend (less than 51% polyester) Personal oil-based products / skin lotions / body lotions removed NA Wigs or hairpieces removed NA Smoking or tobacco materials removed Books / newspapers / magazines / loose paper removed Cologne, aftershave, perfume and deodorant removed Jewelry removed (may wrap wedding band) NA Make-up removed Hair care products removed NA Battery operated devices (external) removed NA Heating patches and chemical warmers removed NA Titanium eyewear removed NA Nail polish cured greater than 10 hours NA Casting material cured greater than 10 hours NA Hearing aids removed Loose dentures or partials removed NA Prosthetics have been removed Patient demonstrates correct use of air break device (if applicable) Patient concerns have been  addressed Patient grounding bracelet on and cord attached to chamber Specifics for Inpatients (complete in addition to above) Medication sheet sent with patient Intravenous medications needed or due during therapy sent with patient Drainage tubes (e.g. nasogastric tube or chest tube secured and vented) Endotracheal or Tracheotomy tube secured Cuff deflated of air and inflated with saline Airway suctioned Electronic Signature(s) Signed: 11/26/2018 8:38:04 AM By: Ronnald Ramp,  Dedrick EMT/HBOT Previous Signature: 11/24/2018 1:31:56 PM Version By: Mikeal Hawthorne EMT/HBOT Previous Signature: 11/23/2018 8:52:15 AM Version By: Mikeal Hawthorne EMT/HBOT Entered By: Mikeal Hawthorne on 11/26/2018 08:38:04

## 2018-11-24 ENCOUNTER — Encounter (HOSPITAL_BASED_OUTPATIENT_CLINIC_OR_DEPARTMENT_OTHER): Payer: Medicare HMO | Admitting: Internal Medicine

## 2018-11-24 NOTE — Progress Notes (Signed)
[name], [name] ([id number]) Visit Report for 11/19/2018 SuperBill Details Patient Name: Date of Service: [name], [name] 11/19/2018 Medical Record [id number] Patient Account Number: [id number] Date of Birth/Sex: Treating RN: Nov 10, 1960 (58 y.o. [name] Primary Care Provider: [name] Other Clinician: Referring Provider: Treating Provider/Extender:Robson, [name], Alyson [name] Diagnosis Coding ICD-10 Codes Code Description N30.41 Irradiation cystitis with hematuria Z85.46 Personal history of malignant neoplasm of prostate Facility Procedures CPT4 Code Description Modifier Quantity [id number] G0277-(Facility Use Only) HBOT, full body chamber, 38min 4 Physician Procedures CPT4 Code Description Modifier Quantity [id number] - WC PHYS HYPERBARIC OXYGEN THERAPY 1 ICD-10 Diagnosis Description N30.41 Irradiation cystitis with hematuria Electronic Signature(s) Signed: 11/19/2018 5:35:18 PM By: Linton Ham MD Signed: 11/24/2018 1:32:19 PM By: Mikeal Hawthorne EMT/HBOT Entered By: Mikeal Hawthorne on 11/19/2018 10:44:01

## 2018-11-24 NOTE — Progress Notes (Signed)
[name], [name] ([id number]) Visit Report for 11/19/2018 Arrival Information Details Patient Name: Date of Service: [name], [name] 11/19/2018 8:00 AM Medical Record [id number] Patient Account Number: [id number] Date of Birth/Sex: Treating RN: 12/02/1960 (58 y.o. [name] Primary Care Carvel Huskins: [name] Other Clinician: Referring Reef Achterberg: Treating Gaudencio Chesnut/Extender:Robson, [name], Alyson [name] Visit Information History Since Last Visit Added or deleted any medications: No Patient Arrived: Ambulatory Any new allergies or adverse reactions: No Arrival Time: 07:55 Had a fall or experienced change in No Accompanied By: self activities of daily living that may affect Transfer Assistance: None risk of falls: Patient Identification Verified: Yes Signs or symptoms of abuse/neglect since last No Secondary Verification Process Yes visito Completed: Hospitalized since last visit: No Patient Requires Transmission-Based No Implantable device outside of the clinic excluding No Precautions: cellular tissue based products placed in the center Patient Has Alerts: No since last visit: Pain Present Now: No Electronic Signature(s) Signed: 11/24/2018 1:32:19 PM By: Mikeal Hawthorne EMT/HBOT Entered By: Mikeal Hawthorne on 11/19/2018 08:09:46 -------------------------------------------------------------------------------- Encounter Discharge Information Details Patient Name: Date of Service: [name], [name] 11/19/2018 8:00 AM Medical Record [id number] Patient Account Number: [id number] Date of Birth/Sex: Treating RN: 1961/01/12 (59 y.o. [name] Primary Care Bettyjane Shenoy: [name] Other Clinician: Referring Lakesa Coste: Treating Justina Bertini/Extender:Robson, [name], Alyson [name] Encounter Discharge Information Items Discharge Condition: Stable Ambulatory Status: Ambulatory Discharge Destination: Home Transportation:  Private Auto Accompanied By: self Schedule Follow-up Appointment: Yes Clinical Summary of Care: Patient Declined Electronic Signature(s) Signed: 11/24/2018 1:32:19 PM By: Mikeal Hawthorne EMT/HBOT Entered By: Mikeal Hawthorne on 11/19/2018 10:45:12 -------------------------------------------------------------------------------- Patient/Caregiver Education Details Patient Name: [name] 10/15/2020andnbsp8:00 Date of Service: AM Medical Record [id number] Number: Patient Account Number: [id number] Treating RN: [birth year] (58 y.o. [name] Date of Birth/Gender: M) Other Clinician: Primary Care Physician: [name] Treating Linton Ham Referring Physician: Physician/Extender: Donnajean Lopes in Treatment: 4 Education Assessment Education Provided To: Patient Education Topics Provided Hyperbaric Oxygenation: Methods: Explain/Verbal Responses: State content correctly Electronic Signature(s) Signed: 11/24/2018 1:32:19 PM By: Mikeal Hawthorne EMT/HBOT Entered By: Mikeal Hawthorne on 11/19/2018 10:44:25 -------------------------------------------------------------------------------- Vitals Details Patient Name: Date of Service: [name], [name] 11/19/2018 8:00 AM Medical Record [id number] Patient Account Number: [id number] Date of Birth/Sex: Treating RN: 1960/12/13 (58 y.o. [name] Primary Care Donnah Levert: [name] Other Clinician: Referring Ainara Eldridge: Treating Kardell Virgil/Extender:Robson, [name], Alyson [name] Vital Signs Time Taken: 08:00 Temperature (F): 98.3 Height (in): 70 Pulse (bpm): 68 Weight (lbs): 255 Respiratory Rate (breaths/min): 17 Body Mass Index (BMI): 36.6 Blood Pressure (mmHg): 161/94 Capillary Blood Glucose (mg/dl): 204 Reference Range: 80 - 120 mg / dl Electronic Signature(s) Signed: 11/24/2018 1:32:19 PM By: Mikeal Hawthorne EMT/HBOT Entered By: Mikeal Hawthorne on 11/19/2018 08:10:17

## 2018-11-24 NOTE — Progress Notes (Signed)
[name], [name] ([id number]) Visit Report for 11/18/2018 Arrival Information Details Patient Name: Date of Service: [name], [name] 11/18/2018 8:00 AM Medical Record [id number] Patient Account Number: [id number] Date of Birth/Sex: Treating RN: April 26, 1960 (58 y.o. [name] Primary Care Paiten Boies: [name] Other Clinician: Referring Lovena Kluck: Treating Derinda Bartus/Extender:Stone III, [name], [name] Weeks in Treatment: 4 Visit Information History Since Last Visit Added or deleted any medications: No Patient Arrived: Ambulatory Any new allergies or adverse reactions: No Arrival Time: 07:55 Had a fall or experienced change in No Accompanied By: self activities of daily living that may affect Transfer Assistance: None risk of falls: Patient Identification Verified: Yes Signs or symptoms of abuse/neglect since last No Secondary Verification Process Yes visito Completed: Hospitalized since last visit: No Patient Requires Transmission-Based No Implantable device outside of the clinic excluding No Precautions: cellular tissue based products placed in the center Patient Has Alerts: No since last visit: Pain Present Now: No Electronic Signature(s) Signed: 11/24/2018 1:32:19 PM By: Mikeal Hawthorne EMT/HBOT Entered By: Mikeal Hawthorne on 11/18/2018 08:37:03 -------------------------------------------------------------------------------- Encounter Discharge Information Details Patient Name: Date of Service: [name], [name] 11/18/2018 8:00 AM Medical Record [id number] Patient Account Number: [id number] Date of Birth/Sex: Treating RN: October 20, 1960 (58 y.o. [name] Primary Care Jenay Morici: [name] Other Clinician: Referring Arynn Armand: Treating Haylo Fake/Extender:Stone III, [name], [name] Weeks in Treatment: 4 Encounter Discharge Information Items Discharge Condition: Stable Ambulatory Status: Ambulatory Discharge Destination: Home Transportation:  Private Auto Accompanied By: self Schedule Follow-up Appointment: Yes Clinical Summary of Care: Patient Declined Electronic Signature(s) Signed: 11/24/2018 1:32:19 PM By: Mikeal Hawthorne EMT/HBOT Entered By: Mikeal Hawthorne on 11/18/2018 10:43:35 -------------------------------------------------------------------------------- Patient/Caregiver Education Details Patient Name: [name] 10/14/2020andnbsp8:00 Date of Service: AM Medical Record [id number] Number: Patient Account Number: [id number] Treating RN: [birth year] (58 y.o. Deon Pilling Date of Birth/Gender: M) Other Clinician: Primary Care Physician: [name] Treating [name] Referring Physician: Physician/Extender: Donnajean Lopes in Treatment: 4 Education Assessment Education Provided To: Patient Education Topics Provided Hyperbaric Oxygenation: Methods: Explain/Verbal Responses: State content correctly Electronic Signature(s) Signed: 11/24/2018 1:32:19 PM By: Mikeal Hawthorne EMT/HBOT Entered By: Mikeal Hawthorne on 11/18/2018 10:43:26 -------------------------------------------------------------------------------- Vitals Details Patient Name: Date of Service: [name], [name] 11/18/2018 8:00 AM Medical Record [id number] Patient Account Number: [id number] Date of Birth/Sex: Treating RN: [birth year] (58 y.o. [name] Primary Care Kaegan Stigler: [name] Other Clinician: Referring Reita Shindler: Treating Rosangelica Pevehouse/Extender:Stone III, [name], [name] Weeks in Treatment: 4 Vital Signs Time Taken: 08:00 Temperature (F): 98 Height (in): 70 Pulse (bpm): 71 Weight (lbs): 255 Respiratory Rate (breaths/min): 17 Body Mass Index (BMI): 36.6 Blood Pressure (mmHg): 157/96 Capillary Blood Glucose (mg/dl): 136 Reference Range: 80 - 120 mg / dl Electronic Signature(s) Signed: 11/24/2018 1:32:19 PM By: Mikeal Hawthorne EMT/HBOT Entered By: Mikeal Hawthorne on 11/18/2018 08:37:21

## 2018-11-24 NOTE — Progress Notes (Signed)
[name], [name] ([id number]) Visit Report for 11/23/2018 SuperBill Details Patient Name: Date of Service: [name], [name] 11/23/2018 Medical Record [id number] Patient Account Number: [id number] Date of Birth/Sex: Treating RN: 02/05/1960 (58 y.o. [name] Primary Care Provider: [name] Other Clinician: Referring Provider: Treating Provider/Extender:Della Homan, [name], Alyson [name] Diagnosis Coding ICD-10 Codes Code Description N30.41 Irradiation cystitis with hematuria Z85.46 Personal history of malignant neoplasm of prostate Facility Procedures CPT4 Code Description Modifier Quantity [id number] G0277-(Facility Use Only) HBOT, full body chamber, 86min 4 Physician Procedures CPT4 Code Description Modifier Quantity [id number] - WC PHYS HYPERBARIC OXYGEN THERAPY 1 ICD-10 Diagnosis Description N30.41 Irradiation cystitis with hematuria Electronic Signature(s) Signed: 11/23/2018 5:54:03 PM By: Linton Ham MD Signed: 11/24/2018 1:32:19 PM By: Mikeal Hawthorne EMT/HBOT Entered By: Mikeal Hawthorne on 11/23/2018 11:52:38

## 2018-11-24 NOTE — Progress Notes (Signed)
[name], [name] ([id number]) Visit Report for 11/23/2018 Arrival Information Details Patient Name: Date of Service: [name], [name] 11/23/2018 8:00 AM Medical Record [id number] Patient Account Number: [id number] Date of Birth/Sex: Treating RN: [birth year] (58 y.o. [name] Primary Care [name]: [name] Other Clinician: Referring [name]: Treating [name]/Extender:[name] in Treatment: 4 Visit Information History Since Last Visit Added or deleted any medications: No Patient Arrived: Ambulatory Any new allergies or adverse reactions: No Arrival Time: 08:05 Had a fall or experienced change in No Accompanied By: self activities of daily living that may affect Transfer Assistance: None risk of falls: Patient Identification Verified: Yes Signs or symptoms of abuse/neglect since last No Secondary Verification Process Yes visito Completed: Hospitalized since last visit: No Patient Requires Transmission-Based No Implantable device outside of the clinic excluding No Precautions: cellular tissue based products placed in the center Patient Has Alerts: No since last visit: Pain Present Now: No Electronic Signature(s) Signed: 11/24/2018 1:32:19 PM By: [name] EMT/HBOT Entered By: [name] on 11/23/2018 08:50:58 -------------------------------------------------------------------------------- Encounter Discharge Information Details Patient Name: Date of Service: [name], [name] 11/23/2018 8:00 AM Medical Record [id number] Patient Account Number: [id number] Date of Birth/Sex: Treating RN: 1960/02/21 (58 y.o. [name] Primary Care Kule Gascoigne: [name] Other Clinician: Referring Karoline Fleer: Treating [name]/Extender:[name] in Treatment: 4 Encounter Discharge Information Items Discharge Condition: Stable Ambulatory Status: Ambulatory Discharge Destination: Home Transportation:  Private Auto Accompanied By: self Schedule Follow-up Appointment: Yes Clinical Summary of Care: Patient Declined Electronic Signature(s) Signed: 11/24/2018 1:32:19 PM By: [name] EMT/HBOT Entered By: [name] on 11/23/2018 11:53:01 -------------------------------------------------------------------------------- Patient/Caregiver Education Details Patient Name: [name] 10/19/2020andnbsp8:00 Date of Service: AM Medical Record [id number] Number: Patient Account Number: [id number] Treating RN: Dec 16, 1960 (58 y.o. [name] Date of Birth/Gender: M) Other Clinician: Primary Care Physician: [name] Treating Linton Ham Referring Physician: Physician/Extender: [name] in Treatment: 4 Education Assessment Education Provided To: Patient Education Topics Provided Hyperbaric Oxygenation: Methods: Explain/Verbal Responses: State content correctly Electronic Signature(s) Signed: 11/24/2018 1:32:19 PM By: [name] EMT/HBOT Entered By: [name] on 11/23/2018 11:52:49 -------------------------------------------------------------------------------- Vitals Details Patient Name: Date of Service: [name], [name] 11/23/2018 8:00 AM Medical Record [id number] Patient Account Number: [id number] Date of Birth/Sex: Treating RN: [birth year] (58 y.o. [name] Primary Care [name]: [name] Other Clinician: Referring Dorthie Santini: Treating [name]/Extender:[name] in Treatment: 4 Vital Signs Time Taken: 08:10 Temperature (F): 97.8 Height (in): 70 Pulse (bpm): 74 Weight (lbs): 255 Respiratory Rate (breaths/min): 16 Body Mass Index (BMI): 36.6 Blood Pressure (mmHg): 162/91 Capillary Blood Glucose (mg/dl): 223 Reference Range: 80 - 120 mg / dl Electronic Signature(s) Signed: 11/24/2018 1:32:19 PM By: [name] EMT/HBOT Entered By: [name] on 11/23/2018 08:51:13

## 2018-11-25 ENCOUNTER — Encounter (HOSPITAL_BASED_OUTPATIENT_CLINIC_OR_DEPARTMENT_OTHER): Payer: Medicare HMO | Admitting: Physician Assistant

## 2018-11-26 ENCOUNTER — Encounter (HOSPITAL_BASED_OUTPATIENT_CLINIC_OR_DEPARTMENT_OTHER): Payer: Medicare HMO | Admitting: Internal Medicine

## 2018-11-26 ENCOUNTER — Other Ambulatory Visit: Payer: Self-pay

## 2018-11-26 DIAGNOSIS — N3041 Irradiation cystitis with hematuria: Secondary | ICD-10-CM | POA: Diagnosis not present

## 2018-11-26 LAB — GLUCOSE, CAPILLARY
Glucose-Capillary: 192 mg/dL — ABNORMAL HIGH (ref 70–99)
Glucose-Capillary: 137 mg/dL — ABNORMAL HIGH (ref 70–99)

## 2018-11-26 NOTE — Progress Notes (Signed)
[name], [name] ([id number]) Visit Report for 11/26/2018 SuperBill Details Patient Name: Date of Service: [name], [name] 11/26/2018 Medical Record [id number] Patient Account Number: [id number] Date of Birth/Sex: Treating RN: May 16, 1960 (58 y.o. [name] Primary Care Provider: [name] Other Clinician: Referring Provider: Treating Provider/Extender:Illianna Paschal, [name], Alyson Locket in Treatment: 5 Diagnosis Coding ICD-10 Codes Code Description N30.41 Irradiation cystitis with hematuria Z85.46 Personal history of malignant neoplasm of prostate Facility Procedures CPT4 Code Description Modifier Quantity [id number] G0277-(Facility Use Only) HBOT, full body chamber, 58min 4 Physician Procedures CPT4 Code Description Modifier Quantity [id number] - WC PHYS HYPERBARIC OXYGEN THERAPY 1 ICD-10 Diagnosis Description N30.41 Irradiation cystitis with hematuria Electronic Signature(s) Signed: 11/26/2018 4:25:07 PM By: Mikeal Hawthorne EMT/HBOT Signed: 11/26/2018 6:29:04 PM By: Linton Ham MD Entered By: Mikeal Hawthorne on 11/26/2018 10:40:23

## 2018-11-26 NOTE — Progress Notes (Signed)
NAJAI, NIE ([id number]) Visit Report for 11/26/2018 Arrival Information Details Patient Name: Date of Service: NOEMI, BRISSEY 11/26/2018 8:00 AM Medical Record [id number] Patient Account Number: [id number] Date of Birth/Sex: Treating RN: Feb 18, 1960 (58 y.o. Lorette Ang, Meta.Reding Primary Care Kortlyn Koltz: [name] Other Clinician: Mikeal Hawthorne Referring Jhamal Plucinski: Treating Ronin Crager/Extender:Robson, [name], Alyson Locket in Treatment: 5 Visit Information History Since Last Visit Added or deleted any medications: No Patient Arrived: Ambulatory Any new allergies or adverse reactions: No Arrival Time: 08:05 Had a fall or experienced change in No Accompanied By: self activities of daily living that may affect Transfer Assistance: None risk of falls: Patient Identification Verified: Yes Signs or symptoms of abuse/neglect since last No Secondary Verification Process Yes visito Completed: Hospitalized since last visit: No Patient Requires Transmission-Based No Implantable device outside of the clinic excluding No Precautions: cellular tissue based products placed in the center Patient Has Alerts: No since last visit: Pain Present Now: No Electronic Signature(s) Signed: 11/26/2018 4:25:07 PM By: Mikeal Hawthorne EMT/HBOT Entered By: Mikeal Hawthorne on 11/26/2018 08:22:48 -------------------------------------------------------------------------------- Encounter Discharge Information Details Patient Name: Date of Service: BRANDONLEE, DONICA 11/26/2018 8:00 AM Medical Record [id number] Patient Account Number: [id number] Date of Birth/Sex: Treating RN: 20-Sep-1960 (58 y.o. [name] Primary Care Laelani Vasko: [name] Other Clinician: Referring Alverta Caccamo: Treating Montserrath Madding/Extender:Robson, [name], Alyson Locket in Treatment: 5 Encounter Discharge Information Items Discharge Condition: Stable Ambulatory Status: Ambulatory Discharge Destination:  Home Transportation: Private Auto Accompanied By: self Schedule Follow-up Appointment: Yes Clinical Summary of Care: Patient Declined Electronic Signature(s) Signed: 11/26/2018 4:25:07 PM By: Mikeal Hawthorne EMT/HBOT Entered By: Mikeal Hawthorne on 11/26/2018 10:41:15 -------------------------------------------------------------------------------- Patient/Caregiver Education Details Patient Name: [name] 10/22/2020andnbsp8:00 Date of Service: AM Medical Record [id number] Number: Patient Account Number: [id number] Treating RN: [birth year] (58 y.o. Deon Pilling Date of Birth/Gender: M) Other Clinician: Primary Care Physician: [name] Treating Linton Ham Referring Physician: Physician/Extender: Donnajean Lopes in Treatment: 5 Education Assessment Education Provided To: Patient Education Topics Provided Hyperbaric Oxygenation: Methods: Explain/Verbal Responses: State content correctly Electronic Signature(s) Signed: 11/26/2018 4:25:07 PM By: Mikeal Hawthorne EMT/HBOT Entered By: Mikeal Hawthorne on 11/26/2018 10:41:02 -------------------------------------------------------------------------------- Vitals Details Patient Name: Date of Service: [name], [name] 11/26/2018 8:00 AM Medical Record [id number] Patient Account Number: [id number] Date of Birth/Sex: Treating RN: [birth year] (58 y.o. [name] Primary Care Elizette Shek: [name] Other Clinician: Referring Awab Abebe: Treating Foster Sonnier/Extender:Robson, [name], Alyson Locket in Treatment: 5 Vital Signs Time Taken: 08:10 Temperature (F): 98.6 Height (in): 70 Pulse (bpm): 69 Weight (lbs): 255 Respiratory Rate (breaths/min): 17 Body Mass Index (BMI): 36.6 Blood Pressure (mmHg): 144/78 Capillary Blood Glucose (mg/dl): 192 Reference Range: 80 - 120 mg / dl Electronic Signature(s) Signed: 11/26/2018 4:25:07 PM By: Mikeal Hawthorne EMT/HBOT Entered By: Mikeal Hawthorne on 11/26/2018  08:23:04

## 2018-11-26 NOTE — Progress Notes (Addendum)
MAXMILIAN, GRANNAN ([id number]) Visit Report for 11/26/2018 HBO Details Patient Name: Date of Service: MARCQUEZ, VIZZI 11/26/2018 8:00 AM Medical Record [id number] Patient Account Number: [id number] Date of Birth/Sex: Treating RN: 12/10/1960 (58 y.o. [name] Primary Care Melondy Blanchard: [name] Other Clinician: Referring Nadja Lina: Treating Samule Life/Extender:Robson, [name], Alyson Locket in Treatment: 5 HBO Treatment Course Details Treatment Course Number: 1 Ordering Fernanda Twaddell: Linton Ham Total Treatments Ordered: 40 HBO Treatment Start Date: 11/16/2018 HBO Indication: Late Effect of Radiation HBO Treatment Details Treatment Number: 7 Patient Type: Outpatient Chamber Type: Monoplace Chamber Serial #: [id number] Treatment Protocol: 2.5 ATA with 90 minutes oxygen, with two 5 minute air breaks Treatment Details Compression Rate Down: 2.0 psi / minute De-Compression Rate Up: 2.0 psi / minute Air breaks and CompressTx Pressure breathing periods DecompressDecompress Begins Reached (leave unused spaces Begins Ends blank) Chamber Pressure (ATA)1 2.5 2.5 2.5 2.5 2.5 --2.5 1 Clock Time (24 hr) 08:17 08:29 [id number] 10:21 Treatment Length: 124 (minutes) Treatment Segments: 4 Vital Signs Capillary Blood Glucose Reference Range: 80 - 120 mg / dl HBO Diabetic Blood Glucose Intervention Range: <131 mg/dl or >249 mg/dl Time Vitals Blood Respiratory Capillary Blood Glucose Pulse Action Type: Pulse: Temperature: Taken: Pressure: Rate: Glucose (mg/dl): Meter #: Oximetry (%) Taken: Pre 08:10 144/78 69 17 98.6 192 Post 10:23 135/83 65 15 98.2 137 Treatment Response Treatment Toleration: Well Treatment Completion Treatment Completed without Adverse Event Status: Myriam Brandhorst Notes No concerns with treatment given Physician HBO Attestation: I certify that I supervised this HBO treatment in accordance with Medicare guidelines. A trained Yes Yes emergency  response team is readily available per hospital policies and procedures. Continue HBOT as ordered. Yes Electronic Signature(s) Signed: 11/26/2018 6:29:04 PM By: Linton Ham MD Previous Signature: 11/26/2018 4:25:07 PM Version By: Mikeal Hawthorne EMT/HBOT Entered By: Linton Ham on 11/26/2018 18:25:43 -------------------------------------------------------------------------------- HBO Safety Checklist Details Patient Name: Date of Service: BERKLEE, ZACHAR 11/26/2018 8:00 AM Medical Record [id number] Patient Account Number: [id number] Date of Birth/Sex: Treating RN: 03/21/1960 (58 y.o. Lorette Ang, Tammi Klippel Primary Care Javona Bergevin: [name] Other Clinician: Referring Davidlee Jeanbaptiste: Treating Elleah Hemsley/Extender:Robson, [name], Alyson Locket in Treatment: 5 HBO Safety Checklist Items Safety Checklist Consent Form Signed Patient voided / foley secured and emptied When did you last eato 0700 - egg sammy Last dose of injectable or oral agent metformin NA Ostomy pouch emptied and vented if applicable NA All implantable devices assessed, documented and approved NA Intravenous access site secured and place Valuables secured Linens and cotton and cotton/polyester blend (less than 51% polyester) Personal oil-based products / skin lotions / body lotions removed NA Wigs or hairpieces removed NA Smoking or tobacco materials removed Books / newspapers / magazines / loose paper removed Cologne, aftershave, perfume and deodorant removed Jewelry removed (may wrap wedding band) NA Make-up removed Hair care products removed NA Battery operated devices (external) removed NA Heating patches and chemical warmers removed NA Titanium eyewear removed NA Nail polish cured greater than 10 hours NA Casting material cured greater than 10 hours NA Hearing aids removed Loose dentures or partials removed NA Prosthetics have been removed Patient demonstrates correct use of air break device (if  applicable) Patient concerns have been addressed Patient grounding bracelet on and cord attached to chamber Specifics for Inpatients (complete in addition to above) Medication sheet sent with patient Intravenous medications needed or due during therapy sent with patient Drainage tubes (e.g. nasogastric tube or chest tube secured and vented) Endotracheal or Tracheotomy tube secured Cuff deflated of air and inflated with saline  Airway suctioned Electronic Signature(s) Signed: 11/27/2018 8:33:37 AM By: Mikeal Hawthorne EMT/HBOT Previous Signature: 11/26/2018 8:35:48 AM Version By: Mikeal Hawthorne EMT/HBOT Entered By: Mikeal Hawthorne on 11/27/2018 08:33:37

## 2018-11-27 ENCOUNTER — Encounter (HOSPITAL_BASED_OUTPATIENT_CLINIC_OR_DEPARTMENT_OTHER): Payer: Medicare HMO | Admitting: Internal Medicine

## 2018-11-27 DIAGNOSIS — N3041 Irradiation cystitis with hematuria: Secondary | ICD-10-CM | POA: Diagnosis not present

## 2018-11-27 LAB — GLUCOSE, CAPILLARY
Glucose-Capillary: 193 mg/dL — ABNORMAL HIGH (ref 70–99)
Glucose-Capillary: 147 mg/dL — ABNORMAL HIGH (ref 70–99)

## 2018-11-27 NOTE — Progress Notes (Signed)
ELMAN, MCCOMISKEY ([id number]) Visit Report for 11/27/2018 Arrival Information Details Patient Name: Date of Service: [name], [name] 11/27/2018 8:00 AM Medical Record [id number] Patient Account Number: [id number] Date of Birth/Sex: Treating RN: 1960-03-05 (58 y.o. [name] Primary Care Eshani Maestre: [name] Other Clinician: Referring Tanyia Grabbe: Treating Esaul Dorwart/Extender:Robson, [name], Alyson Locket in Treatment: 5 Visit Information History Since Last Visit Added or deleted any medications: No Patient Arrived: Ambulatory Any new allergies or adverse reactions: No Arrival Time: 07:55 Had a fall or experienced change in No Accompanied By: self activities of daily living that may affect Transfer Assistance: None risk of falls: Patient Identification Verified: Yes Signs or symptoms of abuse/neglect since last No Secondary Verification Process Yes visito Completed: Hospitalized since last visit: No Patient Requires Transmission-Based No Implantable device outside of the clinic excluding No Precautions: cellular tissue based products placed in the center Patient Has Alerts: No since last visit: Pain Present Now: No Electronic Signature(s) Signed: 11/27/2018 4:09:04 PM By: Mikeal Hawthorne EMT/HBOT Entered By: Mikeal Hawthorne on 11/27/2018 08:30:22 -------------------------------------------------------------------------------- Encounter Discharge Information Details Patient Name: Date of Service: [name], [name] 11/27/2018 8:00 AM Medical Record [id number] Patient Account Number: [id number] Date of Birth/Sex: Treating RN: July 25, 1960 (58 y.o. [name] Primary Care Brock Larmon: [name] Other Clinician: Referring Farren Nelles: Treating Drury Ardizzone/Extender:Robson, [name], Alyson Locket in Treatment: 5 Encounter Discharge Information Items Discharge Condition: Stable Ambulatory Status: Ambulatory Discharge Destination: Home Transportation:  Private Auto Accompanied By: self Schedule Follow-up Appointment: Yes Clinical Summary of Care: Patient Declined Electronic Signature(s) Signed: 11/27/2018 4:09:04 PM By: Mikeal Hawthorne EMT/HBOT Entered By: Mikeal Hawthorne on 11/27/2018 10:48:16 -------------------------------------------------------------------------------- Patient/Caregiver Education Details Patient Name: [name] 10/23/2020andnbsp8:00 Date of Service: AM Medical Record [id number] Number: Patient Account Number: [id number] Treating RN: 28-Jan-1961 (58 y.o. [name] Date of Birth/Gender: M) Other Clinician: Primary Care Physician: [name] Treating Linton Ham Referring Physician: Physician/Extender: Donnajean Lopes in Treatment: 5 Education Assessment Education Provided To: Patient Education Topics Provided Hyperbaric Oxygenation: Methods: Explain/Verbal Responses: State content correctly Electronic Signature(s) Signed: 11/27/2018 4:09:04 PM By: Mikeal Hawthorne EMT/HBOT Entered By: Mikeal Hawthorne on 11/27/2018 10:48:04 -------------------------------------------------------------------------------- Vitals Details Patient Name: Date of Service: [name], [name] 11/27/2018 8:00 AM Medical Record [id number] Patient Account Number: [id number] Date of Birth/Sex: Treating RN: 01/22/1961 (58 y.o. [name] Primary Care Jesselee Poth: [name] Other Clinician: Referring Corgan Mormile: Treating Zebedee Segundo/Extender:Robson, [name], Alyson Locket in Treatment: 5 Vital Signs Time Taken: 08:00 Temperature (F): 97.6 Height (in): 70 Pulse (bpm): 63 Weight (lbs): 255 Respiratory Rate (breaths/min): 15 Body Mass Index (BMI): 36.6 Blood Pressure (mmHg): 153/69 Capillary Blood Glucose (mg/dl): 193 Reference Range: 80 - 120 mg / dl Electronic Signature(s) Signed: 11/27/2018 4:09:04 PM By: Mikeal Hawthorne EMT/HBOT Entered By: Mikeal Hawthorne on 11/27/2018 08:30:36

## 2018-11-27 NOTE — Progress Notes (Signed)
BERNARD, BANKEN ([id number]) Visit Report for 11/27/2018 SuperBill Details Patient Name: Date of Service: CALVARY, SPROTT 11/27/2018 Medical Record [id number] Patient Account Number: [id number] Date of Birth/Sex: Treating RN: 1960/11/01 (57 y.o. 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.41 Irradiation cystitis with hematuria Z85.46 Personal history of malignant neoplasm of prostate Facility Procedures CPT4 Code Description Modifier Quantity [id number] G0277-(Facility Use Only) HBOT, full body chamber, 37min 4 Physician Procedures CPT4 Code Description Modifier Quantity [id number] - WC PHYS HYPERBARIC OXYGEN THERAPY 1 ICD-10 Diagnosis Description 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

## 2018-11-30 ENCOUNTER — Other Ambulatory Visit: Payer: Self-pay

## 2018-11-30 ENCOUNTER — Encounter (HOSPITAL_BASED_OUTPATIENT_CLINIC_OR_DEPARTMENT_OTHER): Payer: Medicare HMO | Admitting: Internal Medicine

## 2018-11-30 DIAGNOSIS — N3041 Irradiation cystitis with hematuria: Secondary | ICD-10-CM | POA: Diagnosis not present

## 2018-11-30 LAB — GLUCOSE, CAPILLARY
Glucose-Capillary: 167 mg/dL — ABNORMAL HIGH (ref 70–99)
Glucose-Capillary: 146 mg/dL — ABNORMAL HIGH (ref 70–99)

## 2018-11-30 NOTE — Progress Notes (Addendum)
RASON, PASLEY ([id number]) Visit Report for 11/27/2018 HBO Details Patient Name: Date of Service: [name], [name] 11/27/2018 8:00 AM Medical Record [id number] Patient Account Number: [id number] Date of Birth/Sex: Treating RN: 19-Jul-1960 (58 y.o. [name] Primary Care Tylar Merendino: [name] Other Clinician: Referring Laiden Milles: Treating Aailyah Dunbar/Extender:Robson, [name], Alyson Locket in Treatment: 5 HBO Treatment Course Details Treatment Course Number: 1 Ordering Cathe Bilger: Linton Ham Total Treatments Ordered: 40 HBO Treatment Start Date: 11/16/2018 HBO Indication: Late Effect of Radiation HBO Treatment Details Treatment Number: 8 Patient Type: Outpatient Chamber Type: Monoplace Chamber Serial #: [id number] Treatment Protocol: 2.5 ATA with 90 minutes oxygen, with two 5 minute air breaks Treatment Details Compression Rate Down: 2.0 psi / minute De-Compression Rate Up: 2.0 psi / minute Air breaks and CompressTx Pressure breathing periods DecompressDecompress Begins Reached (leave unused spaces Begins Ends blank) Chamber Pressure (ATA)1 2.5 2.5 2.5 2.5 2.5 --2.5 1 Clock Time (24 hr) 08:06 08:18 [id number] 10:10 Treatment Length: 124 (minutes) Treatment Segments: 4 Vital Signs Capillary Blood Glucose Reference Range: 80 - 120 mg / dl HBO Diabetic Blood Glucose Intervention Range: <131 mg/dl or >249 mg/dl Time Vitals Blood Respiratory Capillary Blood Glucose Pulse Action Type: Pulse: Temperature: Taken: Pressure: Rate: Glucose (mg/dl): Meter #: Oximetry (%) Taken: Pre 08:00 153/69 63 15 97.6 193 Post 10:12 139/91 61 14 98 147 Treatment Response Treatment Toleration: Well Treatment Completion Treatment Completed without Adverse Event Status: Palyn Scrima Notes No concerns with treatment given Physician HBO Attestation: I certify that I supervised this HBO treatment in accordance with Medicare guidelines. A trained Yes Yes emergency  response team is readily available per hospital policies and procedures. Continue HBOT as ordered. Yes Electronic Signature(s) Signed: 11/27/2018 6:06:00 PM By: Linton Ham MD Previous Signature: 11/27/2018 4:09:04 PM Version By: Mikeal Hawthorne EMT/HBOT Entered By: Linton Ham on 11/27/2018 18:03:37 -------------------------------------------------------------------------------- HBO Safety Checklist Details Patient Name: Date of Service: [name], [name] 11/27/2018 8:00 AM Medical Record [id number] Patient Account Number: [id number] Date of Birth/Sex: Treating RN: March 05, 1960 (58 y.o. [name] Primary Care Caliyah Sieh: [name] Other Clinician: Referring Carel Schnee: Treating Rozell Kettlewell/Extender:Robson, [name], Alyson Locket in Treatment: 5 HBO Safety Checklist Items Safety Checklist Consent Form Signed Patient voided / foley secured and emptied When did you last eato n/a Last dose of injectable or oral agent n/a NA Ostomy pouch emptied and vented if applicable NA All implantable devices assessed, documented and approved NA Intravenous access site secured and place Valuables secured Linens and cotton and cotton/polyester blend (less than 51% polyester) Personal oil-based products / skin lotions / body lotions removed NA Wigs or hairpieces removed NA Smoking or tobacco materials removed Books / newspapers / magazines / loose paper removed Cologne, aftershave, perfume and deodorant removed Jewelry removed (may wrap wedding band) NA Make-up removed Hair care products removed NA Battery operated devices (external) removed NA Heating patches and chemical warmers removed NA Titanium eyewear removed NA Nail polish cured greater than 10 hours NA Casting material cured greater than 10 hours NA Hearing aids removed Loose dentures or partials removed NA Prosthetics have been removed Patient demonstrates correct use of air break device (if applicable) Patient  concerns have been addressed Patient grounding bracelet on and cord attached to chamber Specifics for Inpatients (complete in addition to above) Medication sheet sent with patient Intravenous medications needed or due during therapy sent with patient Drainage tubes (e.g. nasogastric tube or chest tube secured and vented) Endotracheal or Tracheotomy tube secured Cuff deflated of air and inflated with saline Airway suctioned Electronic  Signature(s) Signed: 11/27/2018 8:33:03 AM By: Mikeal Hawthorne EMT/HBOT Previous Signature: 11/27/2018 8:31:14 AM Version By: Mikeal Hawthorne EMT/HBOT Entered By: Mikeal Hawthorne on 11/27/2018 08:33:02

## 2018-11-30 NOTE — Progress Notes (Signed)
ABHIJIT, NOLTING ([id number]) Visit Report for 11/30/2018 SuperBill Details Patient Name: Date of Service: [name], [name] 11/30/2018 Medical Record [id number] Patient Account Number: [id number] Date of Birth/Sex: Treating RN: 09-15-1960 (58 y.o. 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

## 2018-11-30 NOTE — Progress Notes (Signed)
HALSTON, PARRO ([id number]) Visit Report for 11/30/2018 Arrival Information Details Patient Name: Date of Service: [name], [name] 11/30/2018 8:00 AM Medical Record [id number] Patient Account Number: [id number] Date of Birth/Sex: Treating RN: [birth year] (58 y.o. [name] Primary Care Deseray Daponte: [name] Other Clinician: Referring Atiyah Bauer: Treating Donnielle Addison/Extender:Robson, [name], Alyson Locket in Treatment: 5 Visit Information History Since Last Visit Added or deleted any medications: No Patient Arrived: Ambulatory Any new allergies or adverse reactions: No Arrival Time: 08:05 Had a fall or experienced change in No Accompanied By: self activities of daily living that may affect Transfer Assistance: None risk of falls: Patient Identification Verified: Yes Signs or symptoms of abuse/neglect since last No Secondary Verification Process Yes visito Completed: Hospitalized since last visit: No Patient Requires Transmission-Based No Implantable device outside of the clinic excluding No Precautions: cellular tissue based products placed in the center Patient Has Alerts: No since last visit: Pain Present Now: No Electronic Signature(s) Signed: 11/30/2018 4:53:58 PM By: Mikeal Hawthorne EMT/HBOT Entered By: Mikeal Hawthorne on 11/30/2018 08:32:22 -------------------------------------------------------------------------------- Encounter Discharge Information Details Patient Name: Date of Service: [name], [name] 11/30/2018 8:00 AM Medical Record [id number] Patient Account Number: [id number] Date of Birth/Sex: Treating RN: February 10, 1960 (58 y.o. [name] Primary Care Bulah Lurie: [name] Other Clinician: Referring Dominic Rhome: Treating Abrey Bradway/Extender:Robson, [name], Alyson Locket in Treatment: 5 Encounter Discharge Information Items Discharge Condition: Stable Ambulatory Status: Ambulatory Discharge Destination: Home Transportation:  Private Auto Accompanied By: self Schedule Follow-up Appointment: Yes Clinical Summary of Care: Patient Declined Electronic Signature(s) Signed: 11/30/2018 4:53:58 PM By: Mikeal Hawthorne EMT/HBOT Entered By: Mikeal Hawthorne on 11/30/2018 11:57:18 -------------------------------------------------------------------------------- Patient/Caregiver Education Details Patient Name: [name] 10/26/2020andnbsp8:00 Date of Service: AM Medical Record [id number] Number: Patient Account Number: [id number] Treating RN: Jun 04, 1960 (58 y.o. [name] Date of Birth/Gender: M) Other Clinician: Primary Care Physician: [name] Treating Linton Ham Referring Physician: Physician/Extender: Donnajean Lopes in Treatment: 5 Education Assessment Education Provided To: Patient Education Topics Provided Hyperbaric Oxygenation: Methods: Explain/Verbal Responses: State content correctly Electronic Signature(s) Signed: 11/30/2018 4:53:58 PM By: Mikeal Hawthorne EMT/HBOT Entered By: Mikeal Hawthorne on 11/30/2018 11:57:05 -------------------------------------------------------------------------------- Vitals Details Patient Name: Date of Service: [name], [name] 11/30/2018 8:00 AM Medical Record [id number] Patient Account Number: [id number] Date of Birth/Sex: Treating RN: [birth year] (58 y.o. [name] Primary Care Harsimran Westman: [name] Other Clinician: Referring Chasey Dull: Treating Kingsly Kloepfer/Extender:Robson, [name], Alyson Locket in Treatment: 5 Vital Signs Time Taken: 08:10 Temperature (F): 97.8 Height (in): 70 Pulse (bpm): 61 Weight (lbs): 255 Respiratory Rate (breaths/min): 15 Body Mass Index (BMI): 36.6 Blood Pressure (mmHg): 127/69 Capillary Blood Glucose (mg/dl): 167 Reference Range: 80 - 120 mg / dl Electronic Signature(s) Signed: 11/30/2018 4:53:58 PM By: Mikeal Hawthorne EMT/HBOT Entered By: Mikeal Hawthorne on 11/30/2018 08:32:35

## 2018-11-30 NOTE — Progress Notes (Addendum)
DAWUAN, BENDOLPH ([id number]) Visit Report for 11/30/2018 HBO Details Patient Name: Date of Service: LORETTA, ANDREOLI 11/30/2018 8:00 AM Medical Record [id number] Patient Account Number: [id number] Date of Birth/Sex: Treating RN: 09-05-1960 (58 y.o. [name] Primary Care Chesley Veasey: [name] Other Clinician: Referring Lee-Ann Gal: Treating Arabia Nylund/Extender:Robson, [name], Alyson Locket in Treatment: 5 HBO Treatment Course Details Treatment Course Number: 1 Ordering Adin Laker: Linton Ham Total Treatments Ordered: 40 HBO Treatment Start Date: 11/16/2018 HBO Indication: Late Effect of Radiation HBO Treatment Details Treatment Number: 9 Patient Type: Outpatient Chamber Type: Monoplace Chamber Serial #: [id number] Treatment Protocol: 2.5 ATA with 90 minutes oxygen, with two 5 minute air breaks Treatment Details Compression Rate Down: 2.0 psi / minute De-Compression Rate Up: 2.0 psi / minute Air breaks and CompressTx Pressure breathing periods DecompressDecompress Begins Reached (leave unused spaces Begins Ends blank) Chamber Pressure (ATA)1 2.5 2.5 2.5 2.5 2.5 --2.5 1 Clock Time (24 hr) 08:20 08:32 [id number] 10:24 Treatment Length: 124 (minutes) Treatment Segments: 4 Vital Signs Capillary Blood Glucose Reference Range: 80 - 120 mg / dl HBO Diabetic Blood Glucose Intervention Range: <131 mg/dl or >249 mg/dl Time Vitals Blood Respiratory Capillary Blood Glucose Pulse Action Type: Pulse: Temperature: Taken: Pressure: Rate: Glucose (mg/dl): Meter #: Oximetry (%) Taken: Pre 08:10 127/69 61 15 97.8 167 Post 10:27 146/87 61 17 98.5 146 Treatment Response Treatment Toleration: Well Treatment Completion Treatment Completed without Adverse Event Status: Corneluis Allston Notes No concerns with treatment given Physician HBO Attestation: I certify that I supervised this HBO treatment in accordance with Medicare guidelines. A  trained Yes Yes emergency response team is readily available per hospital policies and procedures. Continue HBOT as ordered. Yes Electronic Signature(s) Signed: 11/30/2018 5:48:14 PM By: Linton Ham MD Previous Signature: 11/30/2018 4:53:58 PM Version By: Mikeal Hawthorne EMT/HBOT Entered By: Linton Ham on 11/30/2018 17:45:20 -------------------------------------------------------------------------------- HBO Safety Checklist Details Patient Name: Date of Service: SEANPATRICK, ZOMBRO 11/30/2018 8:00 AM Medical Record [id number] Patient Account Number: [id number] Date of Birth/Sex: Treating RN: Sep 18, 1960 (58 y.o. [name] Primary Care Jt Brabec: [name] Other Clinician: Referring Kashauna Celmer: Treating Huckleberry Martinson/Extender:Robson, [name], Alyson Locket in Treatment: 5 HBO Safety Checklist Items Safety Checklist Consent Form Signed Patient voided / foley secured and emptied When did you last eato 0700 - boiled egg/milk Last dose of injectable or oral agent metformin NA Ostomy pouch emptied and vented if applicable NA All implantable devices assessed, documented and approved NA Intravenous access site secured and place Valuables secured Linens and cotton and cotton/polyester blend (less than 51% polyester) Personal oil-based products / skin lotions / body lotions removed NA Wigs or hairpieces removed NA Smoking or tobacco materials removed Books / newspapers / magazines / loose paper removed Cologne, aftershave, perfume and deodorant removed Jewelry removed (may wrap wedding band) NA Make-up removed Hair care products removed NA Battery operated devices (external) removed NA Heating patches and chemical warmers removed NA Titanium eyewear removed NA Nail polish cured greater than 10 hours NA Casting material cured greater than 10 hours NA Hearing aids removed Loose dentures or partials removed NA Prosthetics have been removed Patient demonstrates  correct use of air break device (if applicable) Patient concerns have been addressed Patient grounding bracelet on and cord attached to chamber Specifics for Inpatients (complete in addition to above) Medication sheet sent with patient Intravenous medications needed or due during therapy sent with patient Drainage tubes (e.g. nasogastric tube or chest tube secured and vented) Endotracheal or Tracheotomy tube secured Cuff deflated of air and inflated with saline  Airway suctioned Electronic Signature(s) Signed: 11/30/2018 8:33:29 AM By: Mikeal Hawthorne EMT/HBOT Entered By: Mikeal Hawthorne on 11/30/2018 08:33:29

## 2018-12-01 ENCOUNTER — Encounter (HOSPITAL_BASED_OUTPATIENT_CLINIC_OR_DEPARTMENT_OTHER): Payer: Medicare HMO | Admitting: Internal Medicine

## 2018-12-01 DIAGNOSIS — N3041 Irradiation cystitis with hematuria: Secondary | ICD-10-CM | POA: Diagnosis not present

## 2018-12-01 LAB — GLUCOSE, CAPILLARY
Glucose-Capillary: 185 mg/dL — ABNORMAL HIGH (ref 70–99)
Glucose-Capillary: 135 mg/dL — ABNORMAL HIGH (ref 70–99)

## 2018-12-01 NOTE — Progress Notes (Signed)
ESSIE, RUDA ([id number]) Visit Report for 12/01/2018 Arrival Information Details Patient Name: Date of Service: [name], [name] 12/01/2018 8:00 AM Medical Record [id number] Patient Account Number: [id number] Date of Birth/Sex: Treating RN: 01/29/1961 (58 y.o. [name]) Carlene Coria Primary Care Merrill Villarruel: [name] Other Clinician: Referring Imogean Ciampa: Treating Takasha Vetere/Extender:Robson, [name], Alyson Locket in Treatment: 6 Visit Information History Since Last Visit Added or deleted any medications: No Patient Arrived: Ambulatory Any new allergies or adverse reactions: No Arrival Time: 08:00 Had a fall or experienced change in No Accompanied By: self activities of daily living that may affect Transfer Assistance: None risk of falls: Patient Identification Verified: Yes Signs or symptoms of abuse/neglect since last No Secondary Verification Process Yes visito Completed: Hospitalized since last visit: No Patient Requires Transmission-Based No Implantable device outside of the clinic excluding No Precautions: cellular tissue based products placed in the center Patient Has Alerts: No since last visit: Pain Present Now: No Electronic Signature(s) Signed: 12/01/2018 3:44:28 PM By: Mikeal Hawthorne EMT/HBOT Entered By: Mikeal Hawthorne on 12/01/2018 08:26:51 -------------------------------------------------------------------------------- Encounter Discharge Information Details Patient Name: Date of Service: [name], [name] 12/01/2018 8:00 AM Medical Record [id number] Patient Account Number: [id number] Date of Birth/Sex: Treating RN: 05/11/1960 (58 y.o. Oval Linsey Primary Care Malanie Koloski: [name] Other Clinician: Referring Adler Chartrand: Treating Batya Citron/Extender:Robson, [name], Alyson Locket in Treatment: 6 Encounter Discharge Information Items Discharge Condition: Stable Ambulatory Status: Ambulatory Discharge Destination: Home Transportation: Private  Auto Accompanied By: self Schedule Follow-up Appointment: Yes Clinical Summary of Care: Patient Declined Electronic Signature(s) Signed: 12/01/2018 3:44:28 PM By: Mikeal Hawthorne EMT/HBOT Entered By: Mikeal Hawthorne on 12/01/2018 10:58:04 -------------------------------------------------------------------------------- Patient/Caregiver Education Details Patient Name: [name] 10/27/2020andnbsp8:00 Date of Service: AM Medical Record [id number] Number: Patient Account Number: [id number] Treating RN: 04/28/1960 (58 y.o. Carlene Coria Date of Birth/Gender: M) Other Clinician: Primary Care Physician: [name] Treating Linton Ham Referring Physician: Physician/Extender: Donnajean Lopes in Treatment: 6 Education Assessment Education Provided To: Patient Education Topics Provided Hyperbaric Oxygenation: Methods: Explain/Verbal Responses: State content correctly Electronic Signature(s) Signed: 12/01/2018 3:44:28 PM By: Mikeal Hawthorne EMT/HBOT Entered By: Mikeal Hawthorne on 12/01/2018 10:57:34 -------------------------------------------------------------------------------- Vitals Details Patient Name: Date of Service: [name], [name] 12/01/2018 8:00 AM Medical Record [id number] Patient Account Number: [id number] Date of Birth/Sex: Treating RN: Nov 21, 1960 (58 y.o. [name]) Carlene Coria Primary Care Airam Heidecker: [name] Other Clinician: Referring Alleyne Lac: Treating Latania Bascomb/Extender:Robson, [name], Alyson Locket in Treatment: 6 Vital Signs Time Taken: 08:05 Temperature (F): 97 Height (in): 70 Pulse (bpm): 61 Weight (lbs): 255 Respiratory Rate (breaths/min): 16 Body Mass Index (BMI): 36.6 Blood Pressure (mmHg): 152/92 Capillary Blood Glucose (mg/dl): 185 Reference Range: 80 - 120 mg / dl Electronic Signature(s) Signed: 12/01/2018 3:44:28 PM By: Mikeal Hawthorne EMT/HBOT Entered By: Mikeal Hawthorne on 12/01/2018 08:27:09

## 2018-12-01 NOTE — Progress Notes (Addendum)
DOAN, PORRO ([id number]) Visit Report for 12/01/2018 HBO Details Patient Name: Date of Service: [name], [name] 12/01/2018 8:00 AM Medical Record [id number] Patient Account Number: [id number] Date of Birth/Sex: Treating RN: 25-Dec-1960 (58 y.o. [name]) Carlene Coria Primary Care Duwan Adrian: [name] Other Clinician: Referring  Jacque: Treating Bernarr Longsworth/Extender:Robson, [name], Alyson Locket in Treatment: 6 HBO Treatment Course Details Treatment Course Number: 1 Ordering Nikesha Kwasny: Linton Ham Total Treatments Ordered: 40 HBO Treatment Start Date: 11/16/2018 HBO Indication: Late Effect of Radiation HBO Treatment Details Treatment Number: 10 Patient Type: Outpatient Chamber Type: Monoplace Chamber Serial #: [id number] Treatment Protocol: 2.5 ATA with 90 minutes oxygen, with two 5 minute air breaks Treatment Details Compression Rate Down: 2.0 psi / minute De-Compression Rate Up: 2.0 psi / minute Air breaks and CompressTx Pressure breathing periods DecompressDecompress Begins Reached (leave unused spaces Begins Ends blank) Chamber Pressure (ATA)1 2.5 2.5 2.5 2.5 2.5 --2.5 1 Clock Time (24 hr) 08:17 08:29 [id number] 10:21 Treatment Length: 124 (minutes) Treatment Segments: 4 Vital Signs Capillary Blood Glucose Reference Range: 80 - 120 mg / dl HBO Diabetic Blood Glucose Intervention Range: <131 mg/dl or >249 mg/dl Time Vitals Blood Respiratory Capillary Blood Glucose Pulse Action Type: Pulse: Temperature: Taken: Pressure: Rate: Glucose (mg/dl): Meter #: Oximetry (%) Taken: Pre 08:05 152/92 61 16 97 185 Post 10:25 143/88 65 16 98 135 Treatment Response Treatment Toleration: Well Treatment Completion Treatment Completed without Adverse Event Status: Alvenia Treese Notes No concerns with treatment given Physician HBO Attestation: I certify that I supervised this HBO treatment in accordance with Medicare guidelines. A trained Yes Yes emergency  response team is readily available per hospital policies and procedures. Continue HBOT as ordered. Yes Electronic Signature(s) Signed: 12/01/2018 6:05:35 PM By: Linton Ham MD Previous Signature: 12/01/2018 3:44:28 PM Version By: Mikeal Hawthorne EMT/HBOT Entered By: Linton Ham on 12/01/2018 18:02:33 -------------------------------------------------------------------------------- HBO Safety Checklist Details Patient Name: Date of Service: [name], [name] 12/01/2018 8:00 AM Medical Record [id number] Patient Account Number: [id number] Date of Birth/Sex: Treating RN: 04-28-1960 (58 y.o. [name]) Carlene Coria Primary Care Love Milbourne: [name] Other Clinician: Referring Laney Louderback: Treating Verlin Uher/Extender:Robson, [name], Alyson Locket in Treatment: 6 HBO Safety Checklist Items Safety Checklist Consent Form Signed Patient voided / foley secured and emptied When did you last eato 0700 - coffee Last dose of injectable or oral agent metformin NA Ostomy pouch emptied and vented if applicable NA All implantable devices assessed, documented and approved NA Intravenous access site secured and place Valuables secured Linens and cotton and cotton/polyester blend (less than 51% polyester) Personal oil-based products / skin lotions / body lotions removed NA Wigs or hairpieces removed NA Smoking or tobacco materials removed Books / newspapers / magazines / loose paper removed Cologne, aftershave, perfume and deodorant removed Jewelry removed (may wrap wedding band) NA Make-up removed Hair care products removed NA Battery operated devices (external) removed NA Heating patches and chemical warmers removed NA Titanium eyewear removed NA Nail polish cured greater than 10 hours NA Casting material cured greater than 10 hours NA Hearing aids removed Loose dentures or partials removed NA Prosthetics have been removed Patient demonstrates correct use of air break device (if  applicable) Patient concerns have been addressed Patient grounding bracelet on and cord attached to chamber Specifics for Inpatients (complete in addition to above) Medication sheet sent with patient Intravenous medications needed or due during therapy sent with patient Drainage tubes (e.g. nasogastric tube or chest tube secured and vented) Endotracheal or Tracheotomy tube secured Cuff deflated of air and inflated with saline Airway  suctioned Electronic Signature(s) Signed: 12/01/2018 8:27:59 AM By: Mikeal Hawthorne EMT/HBOT Entered By: Mikeal Hawthorne on 12/01/2018 08:27:59

## 2018-12-01 NOTE — Progress Notes (Signed)
LAVANCE, BRATZ ([id number]) Visit Report for 12/01/2018 SuperBill Details Patient Name: Date of Service: AADHAVAN, CLYATT 12/01/2018 Medical Record [id number] Patient Account Number: [id number] Date of Birth/Sex: Treating RN: 26-Jul-1960 (58 y.o. [name]) Carlene Coria Primary Care Provider: [name] Other Clinician: Referring Provider: Treating Provider/Extender:Robson, [name], Alyson Locket in Treatment: 6 Diagnosis Coding ICD-10 Codes Code Description N30.41 Irradiation cystitis with hematuria Z85.46 Personal history of malignant neoplasm of prostate Facility Procedures CPT4 Code Description Modifier Quantity [id number] G0277-(Facility Use Only) HBOT, full body chamber, 25min 4 Physician Procedures CPT4 Code Description Modifier Quantity [id number] - WC PHYS HYPERBARIC OXYGEN THERAPY 1 ICD-10 Diagnosis Description N30.41 Irradiation cystitis with hematuria Electronic Signature(s) Signed: 12/01/2018 3:44:28 PM By: Mikeal Hawthorne EMT/HBOT Signed: 12/01/2018 6:05:35 PM By: Linton Ham MD Entered By: Mikeal Hawthorne on 12/01/2018 10:57:21

## 2018-12-02 ENCOUNTER — Encounter (HOSPITAL_BASED_OUTPATIENT_CLINIC_OR_DEPARTMENT_OTHER): Payer: Medicare HMO | Admitting: Physician Assistant

## 2018-12-03 ENCOUNTER — Encounter (HOSPITAL_BASED_OUTPATIENT_CLINIC_OR_DEPARTMENT_OTHER): Payer: Medicare HMO | Admitting: Internal Medicine

## 2018-12-04 ENCOUNTER — Encounter (HOSPITAL_BASED_OUTPATIENT_CLINIC_OR_DEPARTMENT_OTHER): Payer: Medicare HMO | Admitting: Internal Medicine

## 2018-12-07 ENCOUNTER — Encounter (HOSPITAL_BASED_OUTPATIENT_CLINIC_OR_DEPARTMENT_OTHER): Payer: Medicare HMO | Attending: Internal Medicine | Admitting: Internal Medicine

## 2018-12-07 ENCOUNTER — Other Ambulatory Visit: Payer: Self-pay

## 2018-12-07 DIAGNOSIS — Z8546 Personal history of malignant neoplasm of prostate: Secondary | ICD-10-CM | POA: Insufficient documentation

## 2018-12-07 DIAGNOSIS — Z7984 Long term (current) use of oral hypoglycemic drugs: Secondary | ICD-10-CM | POA: Diagnosis not present

## 2018-12-07 DIAGNOSIS — N3041 Irradiation cystitis with hematuria: Secondary | ICD-10-CM | POA: Insufficient documentation

## 2018-12-07 LAB — GLUCOSE, CAPILLARY
Glucose-Capillary: 181 mg/dL — ABNORMAL HIGH (ref 70–99)
Glucose-Capillary: 115 mg/dL — ABNORMAL HIGH (ref 70–99)

## 2018-12-07 NOTE — Progress Notes (Addendum)
[name], [name] ([id number]) Visit Report for 12/07/2018 HBO Details Patient Name: Date of Service: [name], [name] 12/07/2018 8:00 AM Medical Record [id number] Patient Account Number: [id number] Date of Birth/Sex: Treating RN: May 31, 1960 (58 y.o. [name] Primary Care Adrina Armijo: [name] Other Clinician: Referring Britainy Kozub: Treating Trampus Mcquerry/Extender:Robson, [name], Alyson Locket in Treatment: 6 HBO Treatment Course Details Treatment Course Number: 1 Ordering Gerldine Suleiman: Linton Ham Total Treatments Ordered: 40 HBO Treatment Start Date: 11/16/2018 HBO Indication: Late Effect of Radiation HBO Treatment Details Treatment Number: 11 Patient Type: Outpatient Chamber Type: Monoplace Chamber Serial #: [id number] Treatment Protocol: 2.5 ATA with 90 minutes oxygen, with two 5 minute air breaks Treatment Details Compression Rate Down: 2.0 psi / minute De-Compression Rate Up: 2.0 psi / minute Air breaks and CompressTx Pressure breathing periods DecompressDecompress Begins Reached (leave unused spaces Begins Ends blank) Chamber Pressure (ATA)1 2.5 2.5 2.5 2.5 2.5 --2.5 1 Clock Time (24 hr) 08:11 08:23 [id number] 10:15 Treatment Length: 124 (minutes) Treatment Segments: 4 Vital Signs Capillary Blood Glucose Reference Range: 80 - 120 mg / dl HBO Diabetic Blood Glucose Intervention Range: <131 mg/dl or >249 mg/dl Time Vitals Blood Respiratory Capillary Blood Glucose Pulse Action Type: Pulse: Temperature: Taken: Pressure: Rate: Glucose (mg/dl): Meter #: Oximetry (%) Taken: Pre 08:00 109/90 62 17 97.8 181 Post 10:18 149/90 76 15 98.1 115 Treatment Response Treatment Toleration: Well Treatment Completion Treatment Completed without Adverse Event Status: [name] Notes No concerns with treatment given Physician HBO Attestation: I certify that I supervised this HBO treatment in accordance with Medicare guidelines. A trained Yes Yes emergency  response team is readily available per hospital policies and procedures. Continue HBOT as ordered. Yes Electronic Signature(s) Signed: 12/07/2018 5:57:33 PM By: Linton Ham MD Entered By: Linton Ham on 12/07/2018 17:53:44 -------------------------------------------------------------------------------- HBO Safety Checklist Details Patient Name: Date of Service: [name], [name] 12/07/2018 8:00 AM Medical Record [id number] Patient Account Number: [id number] Date of Birth/Sex: Treating RN: 1960/02/17 (58 y.o. [name] Primary Care Larah Kuntzman: [name] Other Clinician: Referring Gabe Glace: Treating Mickey Esguerra/Extender:Robson, [name], Alyson Locket in Treatment: 6 HBO Safety Checklist Items Safety Checklist Consent Form Signed Patient voided / foley secured and emptied When did you last eato 0700 - coffee Last dose of injectable or oral agent metformin NA Ostomy pouch emptied and vented if applicable NA All implantable devices assessed, documented and approved NA Intravenous access site secured and place Valuables secured Linens and cotton and cotton/polyester blend (less than 51% polyester) Personal oil-based products / skin lotions / body lotions removed NA Wigs or hairpieces removed NA Smoking or tobacco materials removed Books / newspapers / magazines / loose paper removed Cologne, aftershave, perfume and deodorant removed Jewelry removed (may wrap wedding band) NA Make-up removed Hair care products removed NA Battery operated devices (external) removed NA Heating patches and chemical warmers removed NA Titanium eyewear removed NA Nail polish cured greater than 10 hours NA Casting material cured greater than 10 hours NA Hearing aids removed Loose dentures or partials removed NA Prosthetics have been removed Patient demonstrates correct use of air break device (if applicable) Patient concerns have been addressed Patient grounding bracelet on and  cord attached to chamber Specifics for Inpatients (complete in addition to above) Medication sheet sent with patient Intravenous medications needed or due during therapy sent with patient Drainage tubes (e.g. nasogastric tube or chest tube secured and vented) Endotracheal or Tracheotomy tube secured Cuff deflated of air and inflated with saline Airway suctioned Electronic Signature(s) Signed: 12/09/2018 8:53:48 AM By: Mikeal Hawthorne  EMT/HBOT Previous Signature: 12/07/2018 8:52:51 AM Version By: Mikeal Hawthorne EMT/HBOT Entered By: Mikeal Hawthorne on 12/09/2018 08:53:48

## 2018-12-08 ENCOUNTER — Other Ambulatory Visit: Payer: Self-pay

## 2018-12-08 ENCOUNTER — Encounter (HOSPITAL_BASED_OUTPATIENT_CLINIC_OR_DEPARTMENT_OTHER): Payer: Medicare HMO | Admitting: Internal Medicine

## 2018-12-08 DIAGNOSIS — N3041 Irradiation cystitis with hematuria: Secondary | ICD-10-CM | POA: Diagnosis not present

## 2018-12-08 LAB — GLUCOSE, CAPILLARY
Glucose-Capillary: 147 mg/dL — ABNORMAL HIGH (ref 70–99)
Glucose-Capillary: 116 mg/dL — ABNORMAL HIGH (ref 70–99)

## 2018-12-08 NOTE — Progress Notes (Signed)
ESTLE, GILFORD ([id number]) Visit Report for 12/07/2018 SuperBill Details Patient Name: Date of Service: [name], [name] 12/07/2018 Medical Record [id number] Patient Account Number: [id number] Date of Birth/Sex: Treating RN: 1960-06-14 (58 y.o. [name] Primary Care Provider: [name] Other Clinician: Referring Provider: Treating Provider/Extender:Robson, [name], Alyson Locket in Treatment: 6 Diagnosis Coding ICD-10 Codes Code Description N30.41 Irradiation cystitis with hematuria Z85.46 Personal history of malignant neoplasm of prostate Facility Procedures CPT4 Code Description Modifier Quantity [id number] G0277-(Facility Use Only) HBOT, full body chamber, 26min 4 Physician Procedures CPT4 Code Description Modifier Quantity [id number] - WC PHYS HYPERBARIC OXYGEN THERAPY 1 ICD-10 Diagnosis Description N30.41 Irradiation cystitis with hematuria Electronic Signature(s) Signed: 12/07/2018 5:57:33 PM By: Linton Ham MD Signed: 12/08/2018 10:47:12 AM By: Mikeal Hawthorne EMT/HBOT Entered By: Mikeal Hawthorne on 12/07/2018 12:06:21

## 2018-12-08 NOTE — Progress Notes (Signed)
[name], [name] ([id number]) Visit Report for 12/08/2018 SuperBill Details Patient Name: Date of Service: [name], [name] 12/08/2018 Medical Record [id number] Patient Account Number: [id number] Date of Birth/Sex: Treating RN: March 03, 1960 (58 y.o. [name] Primary Care Provider: [name] Other Clinician: Referring Provider: Treating Provider/Extender:Keyanah Kozicki, [name], [name] Diagnosis Coding ICD-10 Codes Code Description N30.41 Irradiation cystitis with hematuria Z85.46 Personal history of malignant neoplasm of prostate Facility Procedures CPT4 Code Description Modifier Quantity [id number] G0277-(Facility Use Only) HBOT, full body chamber, 4min 4 Physician Procedures CPT4 Code Description Modifier Quantity [id number] - WC PHYS HYPERBARIC OXYGEN THERAPY 1 ICD-10 Diagnosis Description N30.41 Irradiation cystitis with hematuria Electronic Signature(s) Signed: 12/08/2018 10:47:12 AM By: Mikeal Hawthorne EMT/HBOT Signed: 12/08/2018 6:03:12 PM By: Linton Ham MD Entered By: Mikeal Hawthorne on 12/08/2018 10:35:59

## 2018-12-08 NOTE — Progress Notes (Signed)
[name] ([id number]) Visit Report for 12/08/2018 Arrival Information Details Patient Name: Date of Service: [name], [name] 12/08/2018 8:00 AM Medical Record [id number] Patient Account Number: [id number] Date of Birth/Sex: Treating RN: 07/26/1960 (58 y.o. [name] Primary Care Careem Yasui: [name] Other Clinician: Referring Nandita Mathenia: Treating Akyia Borelli/Extender:Robson, [name], [name] Visit Information History Since Last Visit Added or deleted any medications: No Patient Arrived: Ambulatory Any new allergies or adverse reactions: No Arrival Time: 07:50 Had a fall or experienced change in No Accompanied By: self activities of daily living that may affect Transfer Assistance: None risk of falls: Patient Identification Verified: Yes Signs or symptoms of abuse/neglect since last No Secondary Verification Process Yes visito Completed: Hospitalized since last visit: No Patient Requires Transmission-Based No Implantable device outside of the clinic excluding No Precautions: cellular tissue based products placed in the center Patient Has Alerts: No since last visit: Pain Present Now: No Electronic Signature(s) Signed: 12/08/2018 10:47:12 AM By: Mikeal Hawthorne EMT/HBOT Entered By: Mikeal Hawthorne on 12/08/2018 08:25:29 -------------------------------------------------------------------------------- Encounter Discharge Information Details Patient Name: Date of Service: [name] 12/08/2018 8:00 AM Medical Record [id number] Patient Account Number: [id number] Date of Birth/Sex: Treating RN: 19-Oct-1960 (58 y.o. [name] Primary Care Claira Jeter: [name] Other Clinician: Referring Lidwina Kaner: Treating Tationa Stech/Extender:Robson, [name], [name] Encounter Discharge Information Items Discharge Condition: Stable Ambulatory Status: Ambulatory Discharge Destination: Home Transportation: Private  Auto Accompanied By: self Schedule Follow-up Appointment: Yes Clinical Summary of Care: Patient Declined Electronic Signature(s) Signed: 12/08/2018 10:47:12 AM By: Mikeal Hawthorne EMT/HBOT Entered By: Mikeal Hawthorne on 12/08/2018 10:36:20 -------------------------------------------------------------------------------- Patient/Caregiver Education Details Patient Name: Date of Service: [name] 11/3/2020andnbsp8:00 AM Medical Record [phone] Patient Account Number: [id number] Date of Birth/Gender: 1960-05-01 (58 y.o. M) Treating RN: [name] Primary Care Physician: [name] Other Clinician: Referring Physician: Treating Physician/Extender:Robson, [name], [name] Education Assessment Education Provided To: Patient Education Topics Provided Hyperbaric Oxygenation: Methods: Explain/Verbal Responses: State content correctly Electronic Signature(s) Signed: 12/08/2018 10:47:12 AM By: Mikeal Hawthorne EMT/HBOT Entered By: Mikeal Hawthorne on 12/08/2018 10:36:10 -------------------------------------------------------------------------------- Vitals Details Patient Name: Date of Service: [name] 12/08/2018 8:00 AM Medical Record [id number] Patient Account Number: [id number] Date of Birth/Sex: Treating RN: 17-Mar-1960 (58 y.o. [name] Primary Care Kaya Klausing: [name] Other Clinician: Referring Carri Spillers: Treating Harlyn Italiano/Extender:Robson, [name], [name] Vital Signs Time Taken: 07:55 Temperature (F): 97.5 Height (in): 70 Pulse (bpm): 58 Weight (lbs): 255 Respiratory Rate (breaths/min): 15 Body Mass Index (BMI): 36.6 Blood Pressure (mmHg): 136/93 Capillary Blood Glucose (mg/dl): 147 Reference Range: 80 - 120 mg / dl Electronic Signature(s) Signed: 12/08/2018 10:47:12 AM By: Mikeal Hawthorne EMT/HBOT Entered By: Mikeal Hawthorne on 12/08/2018 08:25:46

## 2018-12-08 NOTE — Progress Notes (Signed)
[name] ([id number]) Visit Report for 12/07/2018 Arrival Information Details Patient Name: Date of Service: [name], [name] 12/07/2018 8:00 AM Medical Record [id number] Patient Account Number: [id number] Date of Birth/Sex: Treating RN: Jun 26, 1960 (57 y.o. [name] Primary Care Kenisha Lynds: [name] Other Clinician: Referring Theoden Mauch: Treating Idamay Hosein/Extender:Robson, [name], Alyson Locket in Treatment: 6 Visit Information History Since Last Visit Added or deleted any medications: No Patient Arrived: Ambulatory Any new allergies or adverse reactions: No Arrival Time: 07:55 Had a fall or experienced change in No Accompanied By: self activities of daily living that may affect Transfer Assistance: None risk of falls: Patient Identification Verified: Yes Signs or symptoms of abuse/neglect since last No Secondary Verification Process Yes visito Completed: Hospitalized since last visit: No Patient Requires Transmission-Based No Implantable device outside of the clinic excluding No Precautions: cellular tissue based products placed in the center Patient Has Alerts: No since last visit: Pain Present Now: No Electronic Signature(s) Signed: 12/08/2018 10:47:12 AM By: Mikeal Hawthorne EMT/HBOT Entered By: Mikeal Hawthorne on 12/07/2018 08:51:51 -------------------------------------------------------------------------------- Encounter Discharge Information Details Patient Name: Date of Service: [name] 12/07/2018 8:00 AM Medical Record [id number] Patient Account Number: [id number] Date of Birth/Sex: Treating RN: 20-Jul-1960 (58 y.o. [name] Primary Care Kaleia Longhi: [name] Other Clinician: Referring Tiburcio Linder: Treating Camdin Hegner/Extender:Robson, [name], Alyson Locket in Treatment: 6 Encounter Discharge Information Items Discharge Condition: Stable Ambulatory Status: Ambulatory Discharge Destination: Home Transportation: Private  Auto Accompanied By: self Schedule Follow-up Appointment: Yes Clinical Summary of Care: Patient Declined Electronic Signature(s) Signed: 12/08/2018 10:47:12 AM By: Mikeal Hawthorne EMT/HBOT Entered By: Mikeal Hawthorne on 12/07/2018 12:06:43 -------------------------------------------------------------------------------- Patient/Caregiver Education Details Patient Name: Date of Service: [name] 11/2/2020andnbsp8:00 AM Medical Record [id number] Patient Account Number: [id number] Date of Birth/Gender: 12/08/1960 (58 y.o. M) Treating RN: [name] Primary Care Physician: [name] Other Clinician: Referring Physician: Treating Physician/Extender:Robson, [name], Alyson Locket in Treatment: 6 Education Assessment Education Provided To: Patient Education Topics Provided Hyperbaric Oxygenation: Methods: Explain/Verbal Responses: State content correctly Electronic Signature(s) Signed: 12/08/2018 10:47:12 AM By: Mikeal Hawthorne EMT/HBOT Entered By: Mikeal Hawthorne on 12/07/2018 12:06:32 -------------------------------------------------------------------------------- Vitals Details Patient Name: Date of Service: [name] 12/07/2018 8:00 AM Medical Record [id number] Patient Account Number: [id number] Date of Birth/Sex: Treating RN: May 30, 1960 (58 y.o. [name] Primary Care Matheus Spiker: [name] Other Clinician: Referring Greg Cratty: Treating Mariyam Remington/Extender:Robson, [name], Alyson Locket in Treatment: 6 Vital Signs Time Taken: 08:00 Temperature (F): 97.8 Height (in): 70 Pulse (bpm): 62 Weight (lbs): 255 Respiratory Rate (breaths/min): 17 Body Mass Index (BMI): 36.6 Blood Pressure (mmHg): 109/90 Capillary Blood Glucose (mg/dl): 181 Reference Range: 80 - 120 mg / dl Electronic Signature(s) Signed: 12/08/2018 10:47:12 AM By: Mikeal Hawthorne EMT/HBOT Entered By: Mikeal Hawthorne on 12/07/2018 08:52:08

## 2018-12-08 NOTE — Progress Notes (Addendum)
GRIGORIY, TREICHLER ([id number]) Visit Report for 12/08/2018 HBO Details Patient Name: Date of Service: [name], [name] 12/08/2018 8:00 AM Medical Record [id number] Patient Account Number: [id number] Date of Birth/Sex: Treating RN: 03-Sep-1960 (58 y.o. [name] Primary Care [name]: [name] Other Clinician: Referring Yaslene Lindamood: Treating Kalaysia Demonbreun/Extender:Robson, [name], [name] HBO Treatment Course Details Treatment Course Number: 1 Ordering Zay Yeargan: Linton Ham Total Treatments Ordered: 40 HBO Treatment Start Date: 11/16/2018 HBO Indication: Late Effect of Radiation HBO Treatment Details Treatment Number: 12 Patient Type: Outpatient Chamber Type: Monoplace Chamber Serial #: [id number] Treatment Protocol: 2.5 ATA with 90 minutes oxygen, with two 5 minute air breaks Treatment Details Compression Rate Down: 2.0 psi / minute De-Compression Rate Up: 2.0 psi / minute Air breaks and CompressTx Pressure breathing periods DecompressDecompress Begins Reached (leave unused spaces Begins Ends blank) Chamber Pressure (ATA)1 2.5 2.5 2.5 2.5 2.5 --2.5 1 Clock Time (24 hr) 08:00 08:12 [id number] 10:04 Treatment Length: 124 (minutes) Treatment Segments: 4 Vital Signs Capillary Blood Glucose Reference Range: 80 - 120 mg / dl HBO Diabetic Blood Glucose Intervention Range: <131 mg/dl or >249 mg/dl Time Vitals Blood Respiratory Capillary Blood Glucose Pulse Action Type: Pulse: Temperature: Taken: Pressure: Rate: Glucose (mg/dl): Meter #: Oximetry (%) Taken: Pre 07:55 136/93 58 15 97.5 147 Post 10:07 169/85 60 16 98.1 116 Treatment Response Treatment Toleration: Well Treatment Completion Treatment Completed without Adverse Event Status: Nickolette Espinola Notes No concerns with treatment given Physician HBO Attestation: I certify that I supervised this HBO treatment in accordance with Medicare guidelines. A trained Yes Yes emergency  response team is readily available per hospital policies and procedures. Continue HBOT as ordered. Yes Electronic Signature(s) Signed: 12/08/2018 6:03:12 PM By: Linton Ham MD Previous Signature: 12/08/2018 10:47:12 AM Version By: Mikeal Hawthorne EMT/HBOT Entered By: Linton Ham on 12/08/2018 17:29:09 -------------------------------------------------------------------------------- HBO Safety Checklist Details Patient Name: Date of Service: [name], [name] 12/08/2018 8:00 AM Medical Record [id number] Patient Account Number: [id number] Date of Birth/Sex: Treating RN: 1960/03/12 (58 y.o. [name] Primary Care Corrin Hingle: [name] Other Clinician: Referring Sole Lengacher: Treating Avyukt Cimo/Extender:Robson, [name], [name] HBO Safety Checklist Items Safety Checklist Consent Form Signed Patient voided / foley secured and emptied When did you last eato 0700 - coffee Last dose of injectable or oral agent metformin NA Ostomy pouch emptied and vented if applicable NA All implantable devices assessed, documented and approved NA Intravenous access site secured and place Valuables secured Linens and cotton and cotton/polyester blend (less than 51% polyester) Personal oil-based products / skin lotions / body lotions removed NA Wigs or hairpieces removed NA Smoking or tobacco materials removed Books / newspapers / magazines / loose paper removed Cologne, aftershave, perfume and deodorant removed Jewelry removed (may wrap wedding band) NA Make-up removed Hair care products removed NA Battery operated devices (external) removed NA Heating patches and chemical warmers removed NA Titanium eyewear removed NA Nail polish cured greater than 10 hours NA Casting material cured greater than 10 hours NA Hearing aids removed Loose dentures or partials removed NA Prosthetics have been removed Patient demonstrates correct use of air break device (if  applicable) Patient concerns have been addressed Patient grounding bracelet on and cord attached to chamber Specifics for Inpatients (complete in addition to above) Medication sheet sent with patient Intravenous medications needed or due during therapy sent with patient Drainage tubes (e.g. nasogastric tube or chest tube secured and vented) Endotracheal or Tracheotomy tube secured Cuff deflated of air and inflated with saline Airway  suctioned Electronic Signature(s) Signed: 12/09/2018 8:53:26 AM By: Mikeal Hawthorne EMT/HBOT Previous Signature: 12/08/2018 8:26:37 AM Version By: Mikeal Hawthorne EMT/HBOT Entered By: Mikeal Hawthorne on 12/09/2018 08:53:26

## 2018-12-09 ENCOUNTER — Encounter (HOSPITAL_BASED_OUTPATIENT_CLINIC_OR_DEPARTMENT_OTHER): Payer: Medicare HMO | Admitting: Physician Assistant

## 2018-12-09 DIAGNOSIS — N3041 Irradiation cystitis with hematuria: Secondary | ICD-10-CM | POA: Diagnosis not present

## 2018-12-09 LAB — GLUCOSE, CAPILLARY
Glucose-Capillary: 160 mg/dL — ABNORMAL HIGH (ref 70–99)
Glucose-Capillary: 124 mg/dL — ABNORMAL HIGH (ref 70–99)

## 2018-12-09 NOTE — Progress Notes (Signed)
AZZIE, CLAYBAUGH ([id number]) Visit Report for 12/09/2018 Problem List Details Patient Name: Date of Service: [name], [name] 12/09/2018 8:00 AM Medical Record [id number] Patient Account Number: [id number] Date of Birth/Sex: Treating RN: 15-Apr-1960 (58 y.o. [name] Primary Care Provider: [name] Other Clinician: Referring Provider: Treating Provider/Extender:Stone III, [name], [name] Weeks in Treatment: 7 Active Problems ICD-10 Evaluated Encounter Code Description Active Date Today Diagnosis N30.41 Irradiation cystitis with hematuria 10/20/2018 No Yes Z85.46 Personal history of malignant neoplasm of prostate 10/20/2018 No Yes Inactive Problems Resolved Problems Electronic Signature(s) Signed: 12/09/2018 6:48:11 PM By: [name] PA-C Entered By: [name] on 12/09/2018 18:45:06 -------------------------------------------------------------------------------- SuperBill Details Patient Name: Date of Service: [name], [name] 12/09/2018 Medical Record [id number] Patient Account Number: [id number] Date of Birth/Sex: Treating RN: 1960-03-02 (58 y.o. [name] Primary Care Provider: [name] Other Clinician: Referring Provider: Treating Provider/Extender:Stone III, [name], [name] Weeks in Treatment: 7 Diagnosis Coding ICD-10 Codes Code Description N30.41 Irradiation cystitis with hematuria Z85.46 Personal history of malignant neoplasm of prostate Facility Procedures CPT4 Code Description: [id number] G0277-(Facility Use Only) HBOT, full body chamber, 57min Modifier: Quantity: 4 Physician Procedures CPT4 Code Description: [id number] - WC PHYS HYPERBARIC OXYGEN THERAPY ICD-10 Diagnosis Description N30.41 Irradiation cystitis with hematuria Modifier: Quantity: 1 Electronic Signature(s) Signed: 12/09/2018 6:48:11 PM By: [name] PA-C Previous Signature: 12/09/2018 3:21:07 PM Version By: Mikeal Hawthorne EMT/HBOT Entered By:  [name] on 12/09/2018 18:45:04

## 2018-12-09 NOTE — Progress Notes (Signed)
[name], [name] ([id number]) Visit Report for 11/17/2018 SuperBill Details Patient Name: Date of Service: [name], [name] 11/17/2018 Medical Record [id number] Patient Account Number: [id number] Date of Birth/Sex: Treating RN: December 31, 1960 (58 y.o. [name]) Carlene Coria Primary Care Provider: [name] Other Clinician: Referring Provider: Treating Provider/Extender:Kierstan Auer, [name], Alyson [name] Diagnosis Coding ICD-10 Codes Code Description N30.41 Irradiation cystitis with hematuria Z85.46 Personal history of malignant neoplasm of prostate Facility Procedures CPT4 Code Description Modifier Quantity [id number] G0277-(Facility Use Only) HBOT, full body chamber, 72min 4 Physician Procedures CPT4 Code Description Modifier Quantity [id number] - WC PHYS HYPERBARIC OXYGEN THERAPY 1 ICD-10 Diagnosis Description N30.41 Irradiation cystitis with hematuria Electronic Signature(s) Signed: 11/17/2018 5:42:33 PM By: Linton Ham MD Signed: 12/09/2018 4:01:42 PM By: Mikeal Hawthorne EMT/HBOT Entered By: Mikeal Hawthorne on 11/17/2018 13:20:02

## 2018-12-09 NOTE — Progress Notes (Signed)
DAMORION, MISIASZEK ([id number]) Visit Report for 11/17/2018 Arrival Information Details Patient Name: Date of Service: CLEVLAND, KOZUB 11/17/2018 8:00 AM Medical Record [id number] Patient Account Number: [id number] Date of Birth/Sex: Treating RN: 02-09-1960 (58 y.o. [name]) Carlene Coria Primary Care Baylee Mccorkel: [name] Other Clinician: Mikeal Hawthorne Referring Cherae Marton: Treating Deyanna Mctier/Extender:Robson, [name], Alyson [name] Visit Information History Since Last Visit Added or deleted any medications: No Patient Arrived: Ambulatory Any new allergies or adverse reactions: No Arrival Time: 08:00 Had a fall or experienced change in No Accompanied By: self activities of daily living that may affect Transfer Assistance: None risk of falls: Patient Identification Verified: Yes Signs or symptoms of abuse/neglect since last No Secondary Verification Process Yes visito Completed: Hospitalized since last visit: No Patient Requires Transmission-Based No Implantable device outside of the clinic excluding No Precautions: cellular tissue based products placed in the center Patient Has Alerts: No since last visit: Pain Present Now: No Electronic Signature(s) Signed: 12/09/2018 4:01:42 PM By: Mikeal Hawthorne EMT/HBOT Entered By: Mikeal Hawthorne on 11/17/2018 08:35:56 -------------------------------------------------------------------------------- Encounter Discharge Information Details Patient Name: Date of Service: VASIL, RIEDELL 11/17/2018 8:00 AM Medical Record [id number] Patient Account Number: [id number] Date of Birth/Sex: Treating RN: [birth year] (58 y.o. Oval Linsey Primary Care Juliany Daughety: [name] Other Clinician: Referring Aleeya Veitch: Treating Keen Ewalt/Extender:Robson, [name], Alyson [name] Encounter Discharge Information Items Discharge Condition: Stable Ambulatory Status: Ambulatory Discharge Destination:  Home Transportation: Private Auto Accompanied By: self Schedule Follow-up Appointment: Yes Clinical Summary of Care: Patient Declined Electronic Signature(s) Signed: 12/09/2018 4:01:42 PM By: Mikeal Hawthorne EMT/HBOT Entered By: Mikeal Hawthorne on 11/17/2018 13:20:36 -------------------------------------------------------------------------------- Patient/Caregiver Education Details Patient Name: [name] 10/13/2020andnbsp8:00 Date of Service: AM Medical Record [id number] Number: Patient Account Number: [id number] Treating RN: 1960-10-11 (58 y.o. Carlene Coria Date of Birth/Gender: M) Other Clinician: Primary Care Physician: [name] Treating Linton Ham Referring Physician: Physician/Extender: Donnajean Lopes in Treatment: 4 Education Assessment Education Provided To: Patient Education Topics Provided Hyperbaric Oxygenation: Methods: Explain/Verbal Responses: State content correctly Electronic Signature(s) Signed: 12/09/2018 4:01:42 PM By: Mikeal Hawthorne EMT/HBOT Entered By: Mikeal Hawthorne on 11/17/2018 13:20:25 -------------------------------------------------------------------------------- Vitals Details Patient Name: Date of Service: MAT, SUFFRIDGE 11/17/2018 8:00 AM Medical Record [id number] Patient Account Number: [id number] Date of Birth/Sex: Treating RN: April 16, 1960 (58 y.o. [name]) Carlene Coria Primary Care Saher Davee: [name] Other Clinician: Referring Breylan Lefevers: Treating Keinan Brouillet/Extender:Robson, [name], Alyson [name] Vital Signs Time Taken: 08:05 Temperature (F): 98.2 Height (in): 70 Pulse (bpm): 68 Weight (lbs): 255 Respiratory Rate (breaths/min): 17 Body Mass Index (BMI): 36.6 Blood Pressure (mmHg): 153/90 Capillary Blood Glucose (mg/dl): 182 Reference Range: 80 - 120 mg / dl Electronic Signature(s) Signed: 12/09/2018 4:01:42 PM By: Mikeal Hawthorne EMT/HBOT Entered By: Mikeal Hawthorne on 11/17/2018 08:40:38

## 2018-12-09 NOTE — Progress Notes (Addendum)
[name], [name] ([id number]) Visit Report for 12/09/2018 HBO Details Patient Name: Date of Service: [name], [name] 12/09/2018 8:00 AM Medical Record [id number] Patient Account Number: [id number] Date of Birth/Sex: Treating RN: [birth year] (58 y.o. [name] Primary Care Aiko Belko: [name] Other Clinician: Referring Briannah Lona: Treating Wyland Rastetter/Extender:Stone III, [name], [name] Weeks in Treatment: 7 HBO Treatment Course Details Treatment Course Number: 1 Ordering Janett Kamath: Linton Ham Total Treatments Ordered: 40 HBO Treatment Start Date: 11/16/2018 HBO Indication: Late Effect of Radiation HBO Treatment Details Treatment Number: 13 Patient Type: Outpatient Chamber Type: Monoplace Chamber Serial #: [id number] Treatment Protocol: 2.5 ATA with 90 minutes oxygen, with two 5 minute air breaks Treatment Details Compression Rate Down: 2.0 psi / minute De-Compression Rate Up: 2.0 psi / minute Air breaks and CompressTx Pressure breathing periods DecompressDecompress Begins Reached (leave unused spaces Begins Ends blank) Chamber Pressure (ATA)1 2.5 2.5 2.5 2.5 2.5 --2.5 1 Clock Time (24 hr) 08:05 08:17 [id number] 10:09 Treatment Length: 124 (minutes) Treatment Segments: 4 Vital Signs Capillary Blood Glucose Reference Range: 80 - 120 mg / dl HBO Diabetic Blood Glucose Intervention Range: <131 mg/dl or >249 mg/dl Time Vitals Blood Respiratory Capillary Blood Glucose Pulse Action Type: Pulse: Temperature: Taken: Pressure: Rate: Glucose (mg/dl): Meter #: Oximetry (%) Taken: Pre 08:00 161/86 62 17 97.5 160 Post 10:11 147/75 57 16 97.9 124 Treatment Response Treatment Toleration: Well Treatment Completion Treatment Completed without Adverse Event Status: Physician HBO Attestation: I certify that I supervised this HBO treatment in accordance with Medicare guidelines. A trained Yes emergency response team is readily available per hospital  policies and procedures. Continue HBOT as ordered. Yes Electronic Signature(s) Signed: 12/09/2018 6:48:11 PM By: [name] PA-C Previous Signature: 12/09/2018 3:21:07 PM Version By: Mikeal Hawthorne EMT/HBOT Entered By: [name] on 12/09/2018 18:45:00 -------------------------------------------------------------------------------- HBO Safety Checklist Details Patient Name: Date of Service: [name], [name] 12/09/2018 8:00 AM Medical Record [id number] Patient Account Number: [id number] Date of Birth/Sex: Treating RN: 09-14-1960 (58 y.o. [name] Primary Care Daymon Hora: [name] Other Clinician: Referring Meshia Rau: Treating Treonna Klee/Extender:Stone III, [name], [name] Weeks in Treatment: 7 HBO Safety Checklist Items Safety Checklist Consent Form Signed Patient voided / foley secured and emptied When did you last eato 0700 - coffee Last dose of injectable or oral agent metformin NA Ostomy pouch emptied and vented if applicable NA All implantable devices assessed, documented and approved NA Intravenous access site secured and place Valuables secured Linens and cotton and cotton/polyester blend (less than 51% polyester) Personal oil-based products / skin lotions / body lotions removed NA Wigs or hairpieces removed NA Smoking or tobacco materials removed Books / newspapers / magazines / loose paper removed Cologne, aftershave, perfume and deodorant removed Jewelry removed (may wrap wedding band) NA Make-up removed Hair care products removed NA Battery operated devices (external) removed NA Heating patches and chemical warmers removed NA Titanium eyewear removed NA Nail polish cured greater than 10 hours NA Casting material cured greater than 10 hours NA Hearing aids removed Loose dentures or partials removed NA Prosthetics have been removed Patient demonstrates correct use of air break device (if applicable) Patient concerns have been  addressed Patient grounding bracelet on and cord attached to chamber Specifics for Inpatients (complete in addition to above) Medication sheet sent with patient Intravenous medications needed or due during therapy sent with patient Drainage tubes (e.g. nasogastric tube or chest tube secured and vented) Endotracheal or Tracheotomy tube secured Cuff deflated of air and inflated with saline Airway suctioned Electronic Signature(s) Signed:  12/09/2018 8:52:57 AM By: Mikeal Hawthorne EMT/HBOT Entered By: Mikeal Hawthorne on 12/09/2018 08:52:56

## 2018-12-09 NOTE — Progress Notes (Signed)
[name], [name] ([id number]) Visit Report for 12/09/2018 Arrival Information Details Patient Name: Date of Service: [name], [name] 12/09/2018 8:00 AM Medical Record [id number] Patient Account Number: [id number] Date of Birth/Sex: Treating RN: September 15, 1960 (59 y.o. [name] Primary Care Bertin Inabinet: [name] Other Clinician: Referring Sydney Azure: Treating Kendell Sagraves/Extender:Stone III, [name], [name] Weeks in Treatment: 7 Visit Information History Since Last Visit Added or deleted any medications: No Patient Arrived: Ambulatory Any new allergies or adverse reactions: No Arrival Time: 07:55 Had a fall or experienced change in No Accompanied By: self activities of daily living that may affect Transfer Assistance: None risk of falls: Patient Identification Verified: Yes Signs or symptoms of abuse/neglect since last No Secondary Verification Process Yes visito Completed: Hospitalized since last visit: No Patient Requires Transmission-Based No Implantable device outside of the clinic excluding No Precautions: cellular tissue based products placed in the center Patient Has Alerts: No since last visit: Pain Present Now: No Electronic Signature(s) Signed: 12/09/2018 3:21:07 PM By: Mikeal Hawthorne EMT/HBOT Entered By: Mikeal Hawthorne on 12/09/2018 08:51:22 -------------------------------------------------------------------------------- Encounter Discharge Information Details Patient Name: Date of Service: [name], [name] 12/09/2018 8:00 AM Medical Record [id number] Patient Account Number: [id number] Date of Birth/Sex: Treating RN: [birth year] (58 y.o. [name] Primary Care Makinze Jani: [name] Other Clinician: Referring Jlon Betker: Treating Uchechi Denison/Extender:Stone III, [name], [name] Weeks in Treatment: 7 Encounter Discharge Information Items Discharge Condition: Stable Ambulatory Status: Ambulatory Discharge Destination: Home Transportation:  Private Auto Accompanied By: self Schedule Follow-up Appointment: Yes Clinical Summary of Care: Patient Declined Electronic Signature(s) Signed: 12/09/2018 3:21:07 PM By: Mikeal Hawthorne EMT/HBOT Entered By: Mikeal Hawthorne on 12/09/2018 10:41:24 -------------------------------------------------------------------------------- Patient/Caregiver Education Details Patient Name: Date of Service: [name], [name] 11/4/2020andnbsp8:00 AM Medical Record [id number] Patient Account Number: [id number] Date of Birth/Gender: [birth year] (58 y.o. M) Treating RN: Baruch Gouty Primary Care Physician: [name] Other Clinician: Referring Physician: Treating Physician/Extender:Stone III, [name], [name] Education Assessment Education Provided To: Patient Education Topics Provided Hyperbaric Oxygenation: Methods: Explain/Verbal Responses: State content correctly Electronic Signature(s) Signed: 12/09/2018 3:21:07 PM By: Mikeal Hawthorne EMT/HBOT Entered By: Mikeal Hawthorne on 12/09/2018 10:41:12 -------------------------------------------------------------------------------- Vitals Details Patient Name: Date of Service: [name], [name] 12/09/2018 8:00 AM Medical Record [id number] Patient Account Number: [id number] Date of Birth/Sex: Treating RN: Jun 30, 1960 (58 y.o. [name] Primary Care Luria Rosario: [name] Other Clinician: Referring Sutton Plake: Treating Aryanna Shaver/Extender:Stone III, [name], [name] Weeks in Treatment: 7 Vital Signs Time Taken: 08:00 Temperature (F): 97.5 Height (in): 70 Pulse (bpm): 62 Weight (lbs): 255 Respiratory Rate (breaths/min): 17 Body Mass Index (BMI): 36.6 Blood Pressure (mmHg): 161/86 Capillary Blood Glucose (mg/dl): 160 Reference Range: 80 - 120 mg / dl Electronic Signature(s) Signed: 12/09/2018 3:21:07 PM By: Mikeal Hawthorne EMT/HBOT Entered By: Mikeal Hawthorne on 12/09/2018 08:51:36

## 2018-12-10 ENCOUNTER — Encounter (HOSPITAL_BASED_OUTPATIENT_CLINIC_OR_DEPARTMENT_OTHER): Payer: Medicare HMO | Admitting: Internal Medicine

## 2018-12-10 ENCOUNTER — Other Ambulatory Visit: Payer: Self-pay

## 2018-12-10 DIAGNOSIS — N3041 Irradiation cystitis with hematuria: Secondary | ICD-10-CM | POA: Diagnosis not present

## 2018-12-10 LAB — GLUCOSE, CAPILLARY
Glucose-Capillary: 174 mg/dL — ABNORMAL HIGH (ref 70–99)
Glucose-Capillary: 112 mg/dL — ABNORMAL HIGH (ref 70–99)

## 2018-12-10 NOTE — Progress Notes (Signed)
[name], [name] ([id number]) Visit Report for 12/10/2018 SuperBill Details Patient Name: Date of Service: [name], [name] 12/10/2018 Medical Record [id number] Patient Account Number: [id number] Date of Birth/Sex: Treating RN: 03-May-1960 (57 y.o. [name] Primary Care Provider: [name] Other Clinician: Referring Provider: Treating Provider/Extender:Suhey Radford, [name], [name] Diagnosis Coding ICD-10 Codes Code Description N30.41 Irradiation cystitis with hematuria Z85.46 Personal history of malignant neoplasm of prostate Facility Procedures CPT4 Code Description Modifier Quantity [id number] G0277-(Facility Use Only) HBOT, full body chamber, 46min 4 Physician Procedures CPT4 Code Description Modifier Quantity [id number] - WC PHYS HYPERBARIC OXYGEN THERAPY 1 ICD-10 Diagnosis Description N30.41 Irradiation cystitis with hematuria Electronic Signature(s) Signed: 12/10/2018 4:13:56 PM By: Mikeal Hawthorne EMT/HBOT Signed: 12/10/2018 5:45:12 PM By: Linton Ham MD Entered By: Mikeal Hawthorne on 12/10/2018 11:11:03

## 2018-12-10 NOTE — Progress Notes (Signed)
REXALL, MAYBANK ([id number]) Visit Report for 12/10/2018 Arrival Information Details Patient Name: Date of Service: [name], [name] 12/10/2018 8:15 AM Medical Record [id number] Patient Account Number: [id number] Date of Birth/Sex: Treating RN: 09/10/1960 (58 y.o. Lorette Ang, Tammi Klippel Primary Care Naylah Cork: [name] Other Clinician: Referring Dashanti Burr: Treating Brizeyda Holtmeyer/Extender:Robson, [name], [name] Visit Information History Since Last Visit Added or deleted any medications: No Patient Arrived: Ambulatory Any new allergies or adverse reactions: No Arrival Time: 08:25 Had a fall or experienced change in No Accompanied By: self activities of daily living that may affect Transfer Assistance: None risk of falls: Patient Identification Verified: Yes Signs or symptoms of abuse/neglect since last No Secondary Verification Process Yes visito Completed: Hospitalized since last visit: No Patient Requires Transmission-Based No Implantable device outside of the clinic excluding No Precautions: cellular tissue based products placed in the center Patient Has Alerts: No since last visit: Pain Present Now: No Electronic Signature(s) Signed: 12/10/2018 4:13:56 PM By: Mikeal Hawthorne EMT/HBOT Entered By: Mikeal Hawthorne on 12/10/2018 08:58:23 -------------------------------------------------------------------------------- Encounter Discharge Information Details Patient Name: Date of Service: [name], [name] 12/10/2018 8:15 AM Medical Record [id number] Patient Account Number: [id number] Date of Birth/Sex: Treating RN: Jan 17, 1961 (58 y.o. [name] Primary Care Neema Fluegge: [name] Other Clinician: Referring Adalai Perl: Treating Felipa Laroche/Extender:Robson, [name], [name] Encounter Discharge Information Items Discharge Condition: Stable Ambulatory Status: Ambulatory Discharge Destination: Home Transportation: Private  Auto Accompanied By: self Schedule Follow-up Appointment: Yes Clinical Summary of Care: Patient Declined Electronic Signature(s) Signed: 12/10/2018 4:13:56 PM By: Mikeal Hawthorne EMT/HBOT Entered By: Mikeal Hawthorne on 12/10/2018 11:11:25 -------------------------------------------------------------------------------- Patient/Caregiver Education Details Patient Name: Date of Service: [name], [name] 11/5/2020andnbsp8:15 AM Medical Record [id number] Patient Account Number: [id number] Date of Birth/Gender: 1960/10/26 (58 y.o. M) Treating RN: Deon Pilling Primary Care Physician: [name] Other Clinician: Referring Physician: Treating Physician/Extender:Robson, [name], [name] Education Assessment Education Provided To: Patient Education Topics Provided Hyperbaric Oxygenation: Methods: Explain/Verbal Responses: State content correctly Electronic Signature(s) Signed: 12/10/2018 4:13:56 PM By: Mikeal Hawthorne EMT/HBOT Entered By: Mikeal Hawthorne on 12/10/2018 11:11:14 -------------------------------------------------------------------------------- Vitals Details Patient Name: Date of Service: [name], [name] 12/10/2018 8:15 AM Medical Record [id number] Patient Account Number: [id number] Date of Birth/Sex: Treating RN: Nov 05, 1960 (58 y.o. [name] Primary Care Alix Stowers: [name] Other Clinician: Referring Suzan Manon: Treating Sayde Lish/Extender:Robson, [name], [name] Vital Signs Time Taken: 08:30 Temperature (F): 97.5 Height (in): 70 Pulse (bpm): 59 Weight (lbs): 255 Respiratory Rate (breaths/min): 16 Body Mass Index (BMI): 36.6 Blood Pressure (mmHg): 135/67 Capillary Blood Glucose (mg/dl): 174 Reference Range: 80 - 120 mg / dl Electronic Signature(s) Signed: 12/10/2018 4:13:56 PM By: Mikeal Hawthorne EMT/HBOT Entered By: Mikeal Hawthorne on 12/10/2018 08:58:41

## 2018-12-10 NOTE — Progress Notes (Addendum)
DEWAN, DEENEY ([id number]) Visit Report for 12/10/2018 HBO Details Patient Name: Date of Service: [name], [name] 12/10/2018 8:15 AM Medical Record [id number] Patient Account Number: [id number] Date of Birth/Sex: Treating RN: September 01, 1960 (58 y.o. [name] Primary Care [name]: [name] Other Clinician: Referring [name]: Treating [name]/Extender:[name] in Treatment: 7 HBO Treatment Course Details Treatment Course Number: 1 Ordering [name]: [name] Total Treatments Ordered: 40 HBO Treatment Start Date: 11/16/2018 HBO Indication: Late Effect of Radiation HBO Treatment Details Treatment Number: 14 Patient Type: Outpatient Chamber Type: Monoplace Chamber Serial #: [id number] Treatment Protocol: 2.5 ATA with 90 minutes oxygen, with two 5 minute air breaks Treatment Details Compression Rate Down: 2.0 psi / minute De-Compression Rate Up: 2.0 psi / minute Air breaks and CompressTx Pressure breathing periods DecompressDecompress Begins Reached (leave unused spaces Begins Ends blank) Chamber Pressure (ATA)1 2.5 2.5 2.5 2.5 2.5 --2.5 1 Clock Time (24 hr) 08:35 08:47 [id number]:5209:57--10:27 10:39 Treatment Length: 124 (minutes) Treatment Segments: 4 Vital Signs Capillary Blood Glucose Reference Range: 80 - 120 mg / dl HBO Diabetic Blood Glucose Intervention Range: <131 mg/dl or >249 mg/dl Time Vitals Blood Respiratory Capillary Blood Glucose Pulse Action Type: Pulse: Temperature: Taken: Pressure: Rate: Glucose (mg/dl): Meter #: Oximetry (%) Taken: Pre 08:30 135/67 59 16 97.5 174 Post 10:42 150/85 55 14 98.2 112 Treatment Response Treatment Toleration: Well Treatment Completion Treatment Completed without Adverse Event Status: [name] Notes No concerns with treatment given Physician HBO Attestation: I certify that I supervised this HBO treatment in accordance with Medicare guidelines. A trained Yes Yes emergency  response team is readily available per hospital policies and procedures. Continue HBOT as ordered. Yes Electronic Signature(s) Signed: 12/10/2018 5:45:12 PM By: [name] Ham MD Previous Signature: 12/10/2018 4:13:56 PM Version By: [name] EMT/HBOT Entered By: [name] on 12/10/2018 17:44:12 -------------------------------------------------------------------------------- HBO Safety Checklist Details Patient Name: Date of Service: [name], [name] 12/10/2018 8:15 AM Medical Record [id number] Patient Account Number: [id number] Date of Birth/Sex: Treating RN: 10-19-1960 (57 y.o. [name], [name] Primary Care [name]: [name] Other Clinician: Referring [name]: Treating [name]:[name] in Treatment: 7 HBO Safety Checklist Items Safety Checklist Consent Form Signed Patient voided / foley secured and emptied When did you last eato 0700 - coffee Last dose of injectable or oral agent metformin NA Ostomy pouch emptied and vented if applicable NA All implantable devices assessed, documented and approved NA Intravenous access site secured and place Valuables secured Linens and cotton and cotton/polyester blend (less than 51% polyester) Personal oil-based products / skin lotions / body lotions removed NA Wigs or hairpieces removed NA Smoking or tobacco materials removed Books / newspapers / magazines / loose paper removed Cologne, aftershave, perfume and deodorant removed Jewelry removed (may wrap wedding band) NA Make-up removed Hair care products removed NA Battery operated devices (external) removed NA Heating patches and chemical warmers removed NA Titanium eyewear removed NA Nail polish cured greater than 10 hours NA Casting material cured greater than 10 hours NA Hearing aids removed Loose dentures or partials removed NA Prosthetics have been removed Patient demonstrates correct use of air break device (if  applicable) Patient concerns have been addressed Patient grounding bracelet on and cord attached to chamber Specifics for Inpatients (complete in addition to above) Medication sheet sent with patient Intravenous medications needed or due during therapy sent with patient Drainage tubes (e.g. nasogastric tube or chest tube secured and vented) Endotracheal or Tracheotomy tube secured Cuff deflated of air and inflated with saline Airway  suctioned Electronic Signature(s) Signed: 12/10/2018 8:59:29 AM By: [name] EMT/HBOT Entered By: [name] on 12/10/2018 08:59:28

## 2018-12-11 ENCOUNTER — Encounter (HOSPITAL_BASED_OUTPATIENT_CLINIC_OR_DEPARTMENT_OTHER): Payer: Medicare HMO | Admitting: Internal Medicine

## 2018-12-11 DIAGNOSIS — N3041 Irradiation cystitis with hematuria: Secondary | ICD-10-CM | POA: Diagnosis not present

## 2018-12-11 LAB — GLUCOSE, CAPILLARY
Glucose-Capillary: 161 mg/dL — ABNORMAL HIGH (ref 70–99)
Glucose-Capillary: 130 mg/dL — ABNORMAL HIGH (ref 70–99)

## 2018-12-11 NOTE — Progress Notes (Addendum)
[name] ([id number]) Visit Report for 12/11/2018 HBO Details Patient Name: Date of Service: [name], [name] 12/11/2018 8:00 AM Medical Record [id number] Patient Account Number: [id number] Date of Birth/Sex: Treating RN: [birth year] (58 y.o. [name] Primary Care Miyani Cronic: [name] Other Clinician: Referring Kristiana Jacko: Treating Eulan Heyward/Extender:Robson, [name], [name] HBO Treatment Course Details Treatment Course Number: 1 Ordering Lorel Lembo: Linton Ham Total Treatments Ordered: 40 HBO Treatment Start Date: 11/16/2018 HBO Indication: Late Effect of Radiation HBO Treatment Details Treatment Number: 15 Patient Type: Outpatient Chamber Type: Monoplace Chamber Serial #: [id number] Treatment Protocol: 2.5 ATA with 90 minutes oxygen, with two 5 minute air breaks Treatment Details Compression Rate Down: 2.0 psi / minute De-Compression Rate Up: 2.0 psi / minute Air breaks and CompressTx Pressure breathing periods DecompressDecompress Begins Reached (leave unused spaces Begins Ends blank) Chamber Pressure (ATA)1 2.5 2.5 2.5 2.5 2.5 --2.5 1 Clock Time (24 hr) 08:10 08:22 [id number] 10:14 Treatment Length: 124 (minutes) Treatment Segments: 4 Vital Signs Capillary Blood Glucose Reference Range: 80 - 120 mg / dl HBO Diabetic Blood Glucose Intervention Range: <131 mg/dl or >249 mg/dl Time Vitals Blood Respiratory Capillary Blood Glucose Pulse Action Type: Pulse: Temperature: Taken: Pressure: Rate: Glucose (mg/dl): Meter #: Oximetry (%) Taken: Pre 08:05 138/84 66 15 97.8 161 Post 10:17 159/86 63 14 98.1 130 Treatment Response Treatment Toleration: Well Treatment Completion Treatment Completed without Adverse Event Status: [employer] Notes No concerns with treatment given Physician HBO Attestation: I certify that I supervised this HBO treatment in accordance with Medicare guidelines. A  trained Yes Yes emergency response team is readily available per hospital policies and procedures. Continue HBOT as ordered. Yes Electronic Signature(s) Signed: 12/13/2018 8:54:13 AM By: Linton Ham MD Entered By: Linton Ham on 12/11/2018 16:24:07 -------------------------------------------------------------------------------- HBO Safety Checklist Details Patient Name: Date of Service: [name] 12/11/2018 8:00 AM Medical Record [id number] Patient Account Number: [id number] Date of Birth/Sex: Treating RN: [birth year] (58 y.o. [name] Primary Care Raylyn Carton: [name] Other Clinician: Referring Taela Charbonneau: Treating Eran Windish/Extender:Robson, [name], [name] HBO Safety Checklist Items Safety Checklist Consent Form Signed Patient voided / foley secured and emptied When did you last eato 0700- milk Last dose of injectable or oral agent metformin NA Ostomy pouch emptied and vented if applicable NA All implantable devices assessed, documented and approved NA Intravenous access site secured and place Valuables secured Linens and cotton and cotton/polyester blend (less than 51% polyester) Personal oil-based products / skin lotions / body lotions removed NA Wigs or hairpieces removed NA Smoking or tobacco materials removed Books / newspapers / magazines / loose paper removed Cologne, aftershave, perfume and deodorant removed Jewelry removed (may wrap wedding band) NA Make-up removed Hair care products removed NA Battery operated devices (external) removed NA Heating patches and chemical warmers removed NA Titanium eyewear removed NA Nail polish cured greater than 10 hours NA Casting material cured greater than 10 hours NA Hearing aids removed Loose dentures or partials removed NA Prosthetics have been removed Patient demonstrates correct use of air break device (if applicable) Patient concerns have been addressed Patient  grounding bracelet on and cord attached to chamber Specifics for Inpatients (complete in addition to above) Medication sheet sent with patient Intravenous medications needed or due during therapy sent with patient Drainage tubes (e.g. nasogastric tube or chest tube secured and vented) Endotracheal or Tracheotomy tube secured Cuff deflated of air and inflated with saline Airway suctioned Electronic Signature(s) Signed: 12/11/2018 9:09:39 AM By: Mikeal Hawthorne EMT/HBOT  Entered ByMikeal Hawthorne on 12/11/2018 09:09:38

## 2018-12-14 ENCOUNTER — Encounter (HOSPITAL_BASED_OUTPATIENT_CLINIC_OR_DEPARTMENT_OTHER): Payer: Medicare HMO | Admitting: Internal Medicine

## 2018-12-14 ENCOUNTER — Other Ambulatory Visit: Payer: Self-pay

## 2018-12-14 DIAGNOSIS — N3041 Irradiation cystitis with hematuria: Secondary | ICD-10-CM | POA: Diagnosis not present

## 2018-12-14 LAB — GLUCOSE, CAPILLARY
Glucose-Capillary: 164 mg/dL — ABNORMAL HIGH (ref 70–99)
Glucose-Capillary: 117 mg/dL — ABNORMAL HIGH (ref 70–99)

## 2018-12-14 NOTE — Progress Notes (Signed)
DELAN, CANJURA ([id number]) Visit Report for 12/11/2018 Arrival Information Details Patient Name: Date of Service: [name], [name] 12/11/2018 8:00 AM Medical Record [id number] Patient Account Number: [id number] Date of Birth/Sex: Treating RN: 1960-06-20 (58 y.o. [name] Primary Care Rama Sorci: [name] Other Clinician: Referring Darry Kelnhofer: Treating Maheen Cwikla/Extender:Robson, [name], [name] Visit Information History Since Last Visit Added or deleted any medications: No Patient Arrived: Ambulatory Any new allergies or adverse reactions: No Arrival Time: 08:00 Had a fall or experienced change in No Accompanied By: self activities of daily living that may affect Transfer Assistance: None risk of falls: Patient Identification Verified: Yes Signs or symptoms of abuse/neglect since last No Secondary Verification Process Yes visito Completed: Hospitalized since last visit: No Patient Requires Transmission-Based No Implantable device outside of the clinic excluding No Precautions: cellular tissue based products placed in the center Patient Has Alerts: No since last visit: Pain Present Now: No Electronic Signature(s) Signed: 12/14/2018 4:06:15 PM By: Mikeal Hawthorne EMT/HBOT Entered By: Mikeal Hawthorne on 12/11/2018 09:07:57 -------------------------------------------------------------------------------- Encounter Discharge Information Details Patient Name: Date of Service: [name], [name] 12/11/2018 8:00 AM Medical Record [id number] Patient Account Number: [id number] Date of Birth/Sex: Treating RN: 1960/08/05 (58 y.o. [name] Primary Care Therman Hughlett: [name] Other Clinician: Referring Mylia Pondexter: Treating Amahia Madonia/Extender:Robson, [name], [name] Encounter Discharge Information Items Discharge Condition: Stable Ambulatory Status: Ambulatory Discharge Destination: Home Transportation:  Private Auto Accompanied By: self Schedule Follow-up Appointment: Yes Clinical Summary of Care: Patient Declined Electronic Signature(s) Signed: 12/14/2018 4:06:15 PM By: Mikeal Hawthorne EMT/HBOT Entered By: Mikeal Hawthorne on 12/11/2018 10:44:05 -------------------------------------------------------------------------------- Patient/Caregiver Education Details Patient Name: Date of Service: [name], [name] 11/6/2020andnbsp8:00 AM Medical Record [phone] Patient Account Number: [id number] Date of Birth/Gender: 05/15/1960 (58 y.o. M) Treating RN: Baruch Gouty Primary Care Physician: [name] Other Clinician: Referring Physician: Treating Physician/Extender:Robson, [name], [name] Education Assessment Education Provided To: Patient Education Topics Provided Hyperbaric Oxygenation: Methods: Explain/Verbal Responses: State content correctly Electronic Signature(s) Signed: 12/14/2018 4:06:15 PM By: Mikeal Hawthorne EMT/HBOT Entered By: Mikeal Hawthorne on 12/11/2018 10:43:55 -------------------------------------------------------------------------------- Vitals Details Patient Name: Date of Service: [name], [name] 12/11/2018 8:00 AM Medical Record [id number] Patient Account Number: [id number] Date of Birth/Sex: Treating RN: [birth year] (58 y.o. [name] Primary Care Mohamed Portlock: [name] Other Clinician: Referring Shaylie Eklund: Treating Myeasha Ballowe/Extender:Robson, [name], [name] Vital Signs Time Taken: 08:05 Temperature (F): 97.8 Height (in): 70 Pulse (bpm): 66 Weight (lbs): 255 Respiratory Rate (breaths/min): 15 Body Mass Index (BMI): 36.6 Blood Pressure (mmHg): 138/84 Capillary Blood Glucose (mg/dl): 161 Reference Range: 80 - 120 mg / dl Electronic Signature(s) Signed: 12/14/2018 4:06:15 PM By: Mikeal Hawthorne EMT/HBOT Entered By: Mikeal Hawthorne on 12/11/2018 09:08:19

## 2018-12-14 NOTE — Progress Notes (Signed)
[name], [name] ([id number]) Visit Report for 12/10/2018 Arrival Information Details Patient Name: Date of Service: [name], [name] 12/10/2018 7:30 AM Medical Record [id number] Patient Account Number: [id number] Date of Birth/Sex: Treating RN: 1960/11/01 (58 y.o. Marvis Repress Primary Care Tayt Moyers: [name] Other Clinician: Referring Alwin Lanigan: Treating Enyla Lisbon/Extender:Robson, [name], [name] Visit Information History Since Last Visit Added or deleted any medications: No Patient Arrived: Ambulatory Any new allergies or adverse reactions: No Arrival Time: 07:51 Had a fall or experienced change in No Accompanied By: self activities of daily living that may affect Transfer Assistance: None risk of falls: Patient Requires Transmission-Based No Signs or symptoms of abuse/neglect since last No Precautions: visito Patient Has Alerts: No Hospitalized since last visit: No Implantable device outside of the clinic excluding No cellular tissue based products placed in the center since last visit: Pain Present Now: No Electronic Signature(s) Signed: 12/10/2018 5:16:49 PM By: Kela Millin Entered By: Kela Millin on 12/10/2018 07:51:45 -------------------------------------------------------------------------------- Clinic Level of Care Assessment Details Patient Name: Date of Service: [name] 12/10/2018 7:30 AM Medical Record [id number] Patient Account Number: [id number] Date of Birth/Sex: Treating RN: January 28, 1961 (58 y.o. [name] Primary Care Antwaine Boomhower: [name] Other Clinician: Referring Dacoda Spallone: Treating Allayah Raineri/Extender:Robson, [name], [name] Clinic Level of Care Assessment Items TOOL 4 Quantity Score X - Use when only an EandM is performed on FOLLOW-UP visit 1 0 ASSESSMENTS - Nursing Assessment / Reassessment X - Reassessment of Co-morbidities (includes updates in patient  status) 1 10 X - Reassessment of Adherence to Treatment Plan 1 5 ASSESSMENTS - Wound and Skin Assessment / Reassessment '[]'$  - Simple Wound Assessment / Reassessment - one wound 0 '[]'$  - Complex Wound Assessment / Reassessment - multiple wounds 0 '[]'$  - Dermatologic / Skin Assessment (not related to wound area) 0 ASSESSMENTS - Focused Assessment '[]'$  - Circumferential Edema Measurements - multi extremities 0 '[]'$  - Nutritional Assessment / Counseling / Intervention 0 '[]'$  - Lower Extremity Assessment (monofilament, tuning fork, pulses) 0 '[]'$  - Peripheral Arterial Disease Assessment (using hand held doppler) 0 ASSESSMENTS - Ostomy and/or Continence Assessment and Care '[]'$  - Incontinence Assessment and Management 0 '[]'$  - Ostomy Care Assessment and Management (repouching, etc.) 0 PROCESS - Coordination of Care X - Simple Patient / Family Education for ongoing care 1 15 '[]'$  - Complex (extensive) Patient / Family Education for ongoing care 0 X - Staff obtains Programmer, systems, Records, Test Results / Process Orders 1 10 '[]'$  - Staff telephones HHA, Nursing Homes / Clarify orders / etc 0 '[]'$  - Routine Transfer to another Facility (non-emergent condition) 0 '[]'$  - Routine Hospital Admission (non-emergent condition) 0 '[]'$  - New Admissions / Biomedical engineer / Ordering NPWT, Apligraf, etc. 0 '[]'$  - Emergency Hospital Admission (emergent condition) 0 X - Simple Discharge Coordination 1 10 '[]'$  - Complex (extensive) Discharge Coordination 0 PROCESS - Special Needs '[]'$  - Pediatric / Minor Patient Management 0 '[]'$  - Isolation Patient Management 0 '[]'$  - Hearing / Language / Visual special needs 0 '[]'$  - Assessment of Community assistance (transportation, D/C planning, etc.) 0 '[]'$  - Additional assistance / Altered mentation 0 '[]'$  - Support Surface(s) Assessment (bed, cushion, seat, etc.) 0 INTERVENTIONS - Wound Cleansing / Measurement '[]'$  - Simple Wound Cleansing - one wound 0 '[]'$  - Complex Wound Cleansing - multiple wounds 0 '[]'$  -  Wound Imaging (photographs - any number of wounds) 0 '[]'$  - Wound Tracing (instead of photographs) 0 '[]'$  - Simple Wound Measurement - one wound  0 '[]'$  - Complex Wound Measurement - multiple wounds 0 INTERVENTIONS - Wound Dressings '[]'$  - Small Wound Dressing one or multiple wounds 0 '[]'$  - Medium Wound Dressing one or multiple wounds 0 '[]'$  - Large Wound Dressing one or multiple wounds 0 '[]'$  - Application of Medications - topical 0 '[]'$  - Application of Medications - injection 0 INTERVENTIONS - Miscellaneous '[]'$  - External ear exam 0 '[]'$  - Specimen Collection (cultures, biopsies, blood, body fluids, etc.) 0 '[]'$  - Specimen(s) / Culture(s) sent or taken to Lab for analysis 0 '[]'$  - Patient Transfer (multiple staff / Harrel Lemon Lift / Similar devices) 0 '[]'$  - Simple Staple / Suture removal (25 or less) 0 '[]'$  - Complex Staple / Suture removal (26 or more) 0 '[]'$  - Hypo / Hyperglycemic Management (close monitor of Blood Glucose) 0 '[]'$  - Ankle / Brachial Index (ABI) - do not check if billed separately 0 X - Vital Signs 1 5 Has the patient been seen at the hospital within the last three years: Yes Total Score: 55 Level Of Care: New/Established - Level 2 Electronic Signature(s) Signed: 12/14/2018 5:46:53 PM By: [name] RN, BSN Entered By: [name] on 12/10/2018 08:24:00 -------------------------------------------------------------------------------- Lower Extremity Assessment Details Patient Name: Date of Service: [name] 12/10/2018 7:30 AM Medical Record [id number] Patient Account Number: [id number] Date of Birth/Sex: Treating RN: 1960/03/11 (58 y.o. Marvis Repress Primary Care Christophere Hillhouse: [name] Other Clinician: Referring Samira Acero: Treating Virgen Belland/Extender:Robson, [name], [name] Electronic Signature(s) Signed: 12/10/2018 5:16:49 PM By: Kela Millin Entered By: Kela Millin on 12/10/2018  07:53:48 -------------------------------------------------------------------------------- Multi Wound Chart Details Patient Name: Date of Service: EDMAN, LIPSEY 12/10/2018 7:30 AM Medical Record [id number] Patient Account Number: [id number] Date of Birth/Sex: Treating RN: 01-22-1961 (58 y.o. [name] Primary Care Elick Aguilera: [name] Other Clinician: Referring Myrle Wanek: Treating Daliah Chaudoin/Extender:Robson, [name], [name] Vital Signs Height(in): 70 Capillary Blood 165 Glucose(mg/dl): Weight(lbs): 255 Pulse(bpm): 65 Body Mass Index(BMI): 37 Blood Pressure(mmHg): 130/75 Temperature(F): 98.5 Respiratory 17 Rate(breaths/min): Wound Assessments Treatment Notes Electronic Signature(s) Signed: 12/10/2018 5:30:28 PM By: Deon Pilling Signed: 12/10/2018 5:45:12 PM By: Linton Ham MD Entered By: Linton Ham on 12/10/2018 08:37:40 -------------------------------------------------------------------------------- Multi-Disciplinary Care Plan Details Patient Name: Date of Service: CAILEAN, HEACOCK 12/10/2018 7:30 AM Medical Record [id number] Patient Account Number: [id number] Date of Birth/Sex: Treating RN: 1960/09/04 (58 y.o. [name] Primary Care Severn Goddard: [name] Other Clinician: Referring Merrel Crabbe: Treating Evana Runnels/Extender:Robson, [name], [name] Active Inactive HBO Nursing Diagnoses: Anxiety related to feelings of confinement associated with the hyperbaric oxygen chamber Anxiety related to knowledge deficit of hyperbaric oxygen therapy and treatment procedures Discomfort related to temperature and humidity changes inside hyperbaric chamber Potential for barotraumas to ears, sinuses, teeth, and lungs or cerebral gas embolism related to changes in atmospheric pressure inside hyperbaric oxygen chamber Potential for oxygen toxicity seizures related to delivery of 100% oxygen at an  increased atmospheric pressure Potential for pulmonary oxygen toxicity related to delivery of 100% oxygen at an increased atmospheric pressure Goals: Barotrauma will be prevented during HBO2 Date Initiated: 10/20/2018 Target Resolution Date: 01/01/2019 Goal Status: Active Patient and/or family will be able to state/discuss factors appropriate to the management of their disease process during treatment Date Inactivated: 12/10/2018 Target Resolution Date Initiated: 10/20/2018 Date: 11/20/2018 Goal Status: Met Patient will tolerate the hyperbaric oxygen therapy treatment Date Initiated: 10/20/2018 Target Resolution Date: 01/01/2019 Goal Status: Active Patient will tolerate the internal climate of the chamber Date Initiated: 10/20/2018 Target  Resolution Date: 01/01/2019 Goal Status: Active Patient/caregiver will verbalize understanding of HBO goals, rationale, procedures and potential hazards Target Resolution Date Initiated: 10/20/2018 Date Inactivated: 12/10/2018 Date: 11/20/2018 Goal Status: Met Signs and symptoms of pulmonary oxygen toxicity will be recognized and promptly addressed Date Initiated: 10/20/2018 Target Resolution Date: 01/01/2019 Goal Status: Active Signs and symptoms of seizure will be recognized and promptly addressed ; seizing patients will suffer no harm Date Initiated: 10/20/2018 Target Resolution Date: 01/01/2019 Goal Status: Active Interventions: Administer a five (5) minute air break for patient if signs and symptoms of seizure appear and notify the hyperbaric physician Administer decongestants, per physician orders, prior to HBO2 Administer the correct therapeutic gas delivery based on the patients needs and limitations, per physician order Assess and provide for patients comfort related to the hyperbaric environment and equalization of middle ear Assess for signs and symptoms related to adverse events, including but not limited to confinement  anxiety, pneumothorax, oxygen toxicity and baurotrauma Assess patient for any history of confinement anxiety Assess patient's knowledge and expectations regarding hyperbaric medicine and provide education related to the hyperbaric environment, goals of treatment and prevention of adverse events Notes: Electronic Signature(s) Signed: 12/14/2018 5:46:53 PM By: [name] RN, BSN Entered By: [name] on 12/10/2018 07:56:42 -------------------------------------------------------------------------------- Pain Assessment Details Patient Name: Date of Service: KARIEM, WOLFSON 12/10/2018 7:30 AM Medical Record [id number] Patient Account Number: [id number] Date of Birth/Sex: Treating RN: May 05, 1960 (58 y.o. Marvis Repress Primary Care Galina Haddox: [name] Other Clinician: Referring Wannetta Langland: Treating Malachi Suderman/Extender:Robson, [name], [name] Active Problems Location of Pain Severity and Description of Pain Patient Has Paino No Site Locations Pain Management and Medication Current Pain Management: Electronic Signature(s) Signed: 12/10/2018 5:16:49 PM By: Kela Millin Entered By: Kela Millin on 12/10/2018 07:53:42 -------------------------------------------------------------------------------- Patient/Caregiver Education Details Patient Name: Date of Service: STEFAN, MARKARIAN 11/5/2020andnbsp7:30 AM Medical Record [phone] Patient Account Number: [id number] Date of Birth/Gender: 02-Jan-1961 (58 y.o. M) Treating RN: [name] Primary Care Physician: [name] Other Clinician: Referring Physician: Treating Physician/Extender:Robson, [name], [name] Education Assessment Education Provided To: Patient Education Topics Provided Hyperbaric Oxygenation: Methods: Explain/Verbal Responses: State content correctly Electronic Signature(s) Signed: 12/14/2018 5:46:53 PM By: [name] RN,  BSN Entered By: [name] on 12/10/2018 07:56:55 -------------------------------------------------------------------------------- [place] Details Patient Name: Date of Service: FOREST, REDWINE 12/10/2018 7:30 AM Medical Record [id number] Patient Account Number: [id number] Date of Birth/Sex: Treating RN: [birth year] (58 y.o. Marvis Repress Primary Care Breaunna Gottlieb: [name] Other Clinician: Referring Nygeria Lager: Treating Tijah Hane/Extender:Robson, [name], [name] Vital Signs Time Taken: 07:51 Temperature (F): 98.5 Height (in): 70 Pulse (bpm): 65 Weight (lbs): 255 Respiratory Rate (breaths/min): 17 Body Mass Index (BMI): 36.6 Blood Pressure (mmHg): 130/75 Capillary Blood Glucose (mg/dl): 165 Reference Range: 80 - 120 mg / dl Notes patient reported last CBG was 165 Electronic Signature(s) Signed: 12/10/2018 5:16:49 PM By: Kela Millin Entered By: Kela Millin on 12/10/2018 07:53:33

## 2018-12-14 NOTE — Progress Notes (Signed)
YVES, DISANTIS ([id number]) Visit Report for 12/14/2018 SuperBill Details Patient Name: Date of Service: MUCAAD, SCHIERMAN 12/14/2018 Medical Record [id number] Patient Account Number: [id number] Date of Birth/Sex: Treating RN: 08/20/1960 (58 y.o. [name] Primary Care Provider: [name] Other Clinician: Referring Provider: Treating Provider/Extender:Avalee Castrellon, [name], [name] Diagnosis Coding ICD-10 Codes Code Description N30.41 Irradiation cystitis with hematuria Z85.46 Personal history of malignant neoplasm of prostate Facility Procedures CPT4 Code Description Modifier Quantity [id number] G0277-(Facility Use Only) HBOT, full body chamber, 88min 4 Physician Procedures CPT4 Code Description Modifier Quantity [id number] - WC PHYS HYPERBARIC OXYGEN THERAPY 1 ICD-10 Diagnosis Description N30.41 Irradiation cystitis with hematuria Electronic Signature(s) Signed: 12/14/2018 4:06:15 PM By: Mikeal Hawthorne EMT/HBOT Signed: 12/14/2018 5:25:49 PM By: Linton Ham MD Entered By: Mikeal Hawthorne on 12/14/2018 10:48:28

## 2018-12-14 NOTE — Progress Notes (Signed)
[name], [name] ([id number]) Visit Report for 12/11/2018 SuperBill Details Patient Name: Date of Service: [name], [name] 12/11/2018 Medical Record [id number] Patient Account Number: [id number] Date of Birth/Sex: Treating RN: Jan 24, 1961 (58 y.o. [name] Primary Care Provider: [name] Other Clinician: Referring Provider: Treating Provider/Extender:Robson, [name], [name] Diagnosis Coding ICD-10 Codes Code Description N30.41 Irradiation cystitis with hematuria Z85.46 Personal history of malignant neoplasm of prostate Facility Procedures CPT4 Code Description Modifier Quantity [id number] G0277-(Facility Use Only) HBOT, full body chamber, 62min 4 Physician Procedures CPT4 Code Description Modifier Quantity [id number] - WC PHYS HYPERBARIC OXYGEN THERAPY 1 ICD-10 Diagnosis Description N30.41 Irradiation cystitis with hematuria Electronic Signature(s) Signed: 12/13/2018 8:54:13 AM By: Linton Ham MD Signed: 12/14/2018 4:06:15 PM By: Mikeal Hawthorne EMT/HBOT Entered By: Mikeal Hawthorne on 12/11/2018 10:43:44

## 2018-12-14 NOTE — Progress Notes (Signed)
DONACIANO, SZOTT ([id number]) Visit Report for 12/14/2018 Arrival Information Details Patient Name: Date of Service: [name], [name] 12/14/2018 8:00 AM Medical Record [id number] Patient Account Number: [id number] Date of Birth/Sex: Treating RN: 06/13/1960 (58 y.o. [name] Primary Care Parisha Beaulac: [name] Other Clinician: Referring Merin Borjon: Treating Lilymae Swiech/Extender:Robson, [name], [name] Visit Information History Since Last Visit Added or deleted any medications: No Patient Arrived: Ambulatory Any new allergies or adverse reactions: No Arrival Time: 08:00 Had a fall or experienced change in No Accompanied By: self activities of daily living that may affect Transfer Assistance: None risk of falls: Patient Identification Verified: Yes Signs or symptoms of abuse/neglect since last No Secondary Verification Process Yes visito Completed: Hospitalized since last visit: No Patient Requires Transmission-Based No Implantable device outside of the clinic excluding No Precautions: cellular tissue based products placed in the center Patient Has Alerts: No since last visit: Pain Present Now: No Electronic Signature(s) Signed: 12/14/2018 4:06:15 PM By: Mikeal Hawthorne EMT/HBOT Entered By: Mikeal Hawthorne on 12/14/2018 08:22:23 -------------------------------------------------------------------------------- Encounter Discharge Information Details Patient Name: Date of Service: [name], [name] 12/14/2018 8:00 AM Medical Record [id number] Patient Account Number: [id number] Date of Birth/Sex: Treating RN: 12-Jan-1961 (58 y.o. [name] Primary Care Treyshaun Keatts: [name] Other Clinician: Referring Deserae Jennings: Treating Sylvan Sookdeo/Extender:Robson, [name], [name] Encounter Discharge Information Items Discharge Condition: Stable Ambulatory Status: Ambulatory Discharge Destination: Home Transportation: Private  Auto Accompanied By: self Schedule Follow-up Appointment: Yes Clinical Summary of Care: Patient Declined Electronic Signature(s) Signed: 12/14/2018 4:06:15 PM By: Mikeal Hawthorne EMT/HBOT Entered By: Mikeal Hawthorne on 12/14/2018 10:48:50 -------------------------------------------------------------------------------- Patient/Caregiver Education Details Patient Name: Date of Service: [name], [name] 11/9/2020andnbsp8:00 AM Medical Record [id number] Patient Account Number: [id number] Date of Birth/Gender: 09/18/1960 (58 y.o. M) Treating RN: [name] Primary Care Physician: [name] Other Clinician: Referring Physician: Treating Physician/Extender:Robson, [name], [name] Education Assessment Education Provided To: Patient Education Topics Provided Hyperbaric Oxygenation: Methods: Explain/Verbal Responses: State content correctly Electronic Signature(s) Signed: 12/14/2018 4:06:15 PM By: Mikeal Hawthorne EMT/HBOT Entered By: Mikeal Hawthorne on 12/14/2018 10:48:40 -------------------------------------------------------------------------------- Vitals Details Patient Name: Date of Service: [name], [name] 12/14/2018 8:00 AM Medical Record [id number] Patient Account Number: [id number] Date of Birth/Sex: Treating RN: 03-05-1960 (58 y.o. [name] Primary Care Jacobey Gura: [name] Other Clinician: Referring Vassie Kugel: Treating Laurence Crofford/Extender:Robson, [name], [name] Vital Signs Time Taken: 08:05 Temperature (F): 97.9 Height (in): 70 Pulse (bpm): 61 Weight (lbs): 255 Respiratory Rate (breaths/min): 15 Body Mass Index (BMI): 36.6 Blood Pressure (mmHg): 153/82 Capillary Blood Glucose (mg/dl): 164 Reference Range: 80 - 120 mg / dl Electronic Signature(s) Signed: 12/14/2018 4:06:15 PM By: Mikeal Hawthorne EMT/HBOT Entered By: Mikeal Hawthorne on 12/14/2018 08:22:41

## 2018-12-14 NOTE — Progress Notes (Addendum)
DAEJOHN, AUMENT ([id number]) Visit Report for 12/14/2018 HBO Details Patient Name: Date of Service: [name], [name] 12/14/2018 8:00 AM Medical Record [id number] Patient Account Number: [id number] Date of Birth/Sex: Treating RN: Aug 09, 1960 (58 y.o. [name] Primary Care Damarys Speir: [name] Other Clinician: Referring [name] Cullens: Treating Tangee Marszalek/Extender:Robson, [name], [name] HBO Treatment Course Details Treatment Course Number: 1 Ordering Larence Thone: Linton Ham Total Treatments Ordered: 40 HBO Treatment Start Date: 11/16/2018 HBO Indication: Late Effect of Radiation HBO Treatment Details Treatment Number: 16 Patient Type: Outpatient Chamber Type: Monoplace Chamber Serial #: [id number] Treatment Protocol: 2.5 ATA with 90 minutes oxygen, with two 5 minute air breaks Treatment Details Compression Rate Down: 2.0 psi / minute De-Compression Rate Up: 2.0 psi / minute Air breaks and CompressTx Pressure breathing periods DecompressDecompress Begins Reached (leave unused spaces Begins Ends blank) Chamber Pressure (ATA)1 2.5 2.5 2.5 2.5 2.5 --2.5 1 Clock Time (24 hr) 08:13 08:25 08:5509:0009:3009:35--10:05 10:17 Treatment Length: 124 (minutes) Treatment Segments: 4 Vital Signs Capillary Blood Glucose Reference Range: 80 - 120 mg / dl HBO Diabetic Blood Glucose Intervention Range: <131 mg/dl or >249 mg/dl Time Vitals Blood Respiratory Capillary Blood Glucose Pulse Action Type: Pulse: Temperature: Taken: Pressure: Rate: Glucose (mg/dl): Meter #: Oximetry (%) Taken: Pre 08:05 153/82 61 15 97.9 164 Post 10:20 160/99 63 13 98.4 117 Treatment Response Treatment Toleration: Well Treatment Completion Treatment Completed without Adverse Event Status: Leilah Polimeni Notes No concerns with treatment given Physician HBO Attestation: I certify that I supervised this HBO treatment in accordance with Medicare guidelines. A trained Yes Yes emergency  response team is readily available per hospital policies and procedures. Continue HBOT as ordered. Yes Electronic Signature(s) Signed: 12/14/2018 5:25:49 PM By: Linton Ham MD Previous Signature: 12/14/2018 4:06:15 PM Version By: Mikeal Hawthorne EMT/HBOT Entered By: Linton Ham on 12/14/2018 17:22:39 -------------------------------------------------------------------------------- HBO Safety Checklist Details Patient Name: Date of Service: [name], [name] 12/14/2018 8:00 AM Medical Record [id number] Patient Account Number: [id number] Date of Birth/Sex: Treating RN: [birth year] (58 y.o. [name] Primary Care Demarko Zeimet: [name] Other Clinician: Referring Lynnix Schoneman: Treating Xana Bradt/Extender:Robson, [name], [name] HBO Safety Checklist Items Safety Checklist Consent Form Signed Patient voided / foley secured and emptied When did you last eato 0700 - milk Last dose of injectable or oral agent metformin NA Ostomy pouch emptied and vented if applicable NA All implantable devices assessed, documented and approved NA Intravenous access site secured and place Valuables secured Linens and cotton and cotton/polyester blend (less than 51% polyester) Personal oil-based products / skin lotions / body lotions removed NA Wigs or hairpieces removed NA Smoking or tobacco materials removed Books / newspapers / magazines / loose paper removed Cologne, aftershave, perfume and deodorant removed Jewelry removed (may wrap wedding band) NA Make-up removed Hair care products removed NA Battery operated devices (external) removed NA Heating patches and chemical warmers removed NA Titanium eyewear removed NA Nail polish cured greater than 10 hours NA Casting material cured greater than 10 hours NA Hearing aids removed Loose dentures or partials removed NA Prosthetics have been removed Patient demonstrates correct use of air break device (if  applicable) Patient concerns have been addressed Patient grounding bracelet on and cord attached to chamber Specifics for Inpatients (complete in addition to above) Medication sheet sent with patient Intravenous medications needed or due during therapy sent with patient Drainage tubes (e.g. nasogastric tube or chest tube secured and vented) Endotracheal or Tracheotomy tube secured Cuff deflated of air and inflated with saline Airway  suctioned Electronic Signature(s) Signed: 12/14/2018 8:23:24 AM By: Mikeal Hawthorne EMT/HBOT Entered By: Mikeal Hawthorne on 12/14/2018 [id number]

## 2018-12-14 NOTE — Progress Notes (Signed)
GRIFF, LIZANO ([id number]) Visit Report for 12/10/2018 HPI Details Patient Name: Date of Service: AEDON, PERCELL 12/10/2018 7:30 AM Medical Record [id number] Patient Account Number: [id number] Date of Birth/Sex: Treating RN: [birth year] (59 y.o. [name] Primary Care Provider: [name] Other Clinician: Referring Provider: Treating Provider/Extender:Giann Obara, [name], [name] History of Present Illness HPI Description: ADMISSION 10/20/2018 This is a 58 year old man who comes to clinic for consideration of hyperbaric oxygen therapy for radiation cystitis. The patient was treated for T2 N0 prostate cancer with a radical prostatectomy and adjunctive radiation in 2016 at [facility]. We do not have access to these notes at this time. The patient states that he did well up until 2 months ago. He started to develop painless gross hematuria. This is episodic he finds that if there is a strenuous issue such as coughing laughing that he is likely to develop hematuria with next void. He is followed by Dr. Link Snuffer urology at [facility] urology. They note that the patient has intermittent gross hematuria and that he is passed a few clots. A cystoscopy was done in July that was negative. He also had a CT IVP that was also negative. His most recent PSA was 0.134 on 7/13. The patient denies any dysuria, hesitancy or obstructive voiding. He is otherwise well. Past medical history is most relevant for a history of coronary artery disease with a history of coronary artery stents. His Brilinta was held because of the bleeding with the approval of his cardiologist Dr. [name]. He also has hypertension, hyperlipidemia, history of cognitive impairment, postsurgical DVT, His last radiation treatment was apparently in December 2016 and the last visit to radiation oncology was in August 2018 11/5 the patient was seen today predominantly because of  requirements of his insurance [employer] for ongoing monitoring of the effectiveness of hyperbaric therapy before continuing treatment. I spoke with the patient today. He states that he is have only one episode of gross hematuria since starting treatment. He thinks that he is having less urinary urgency less frequency and less nocturia. He has had no clots no urinary obstruction and no dysuria. He is handling the treatment very well. Electronic Signature(s) Signed: 12/10/2018 5:45:12 PM By: Linton Ham MD Entered By: Linton Ham on 12/10/2018 08:40:24 -------------------------------------------------------------------------------- Physical Exam Details Patient Name: Date of Service: HUSTON, DITTUS 12/10/2018 7:30 AM Medical Record [id number] Patient Account Number: [id number] Date of Birth/Sex: Treating RN: 12/25/1960 (57 y.o. [name] Primary Care Provider: [name] Other Clinician: Referring Provider: Treating Provider/Extender:Monaca Wadas, [name], [name] Constitutional Sitting or standing Blood Pressure is within target range for patient.. Pulse regular and within target range for patient.Marland Kitchen Respirations regular, non-labored and within target range.. Temperature is normal and within the target range for the patient.. The patient looks well.. Genitourinary (GU) No bladder distention. Electronic Signature(s) Signed: 12/10/2018 5:45:12 PM By: Linton Ham MD Entered By: Linton Ham on 12/10/2018 08:40:49 -------------------------------------------------------------------------------- Physician Orders Details Patient Name: Date of Service: DONIVEN, KAMM 12/10/2018 7:30 AM Medical Record [id number] Patient Account Number: [id number] Date of Birth/Sex: Treating RN: [birth year] (58 y.o. [name] Primary Care Provider: [name] Other Clinician: Referring Provider: Treating Provider/Extender:Markevious Ehmke, [name],  [name] Verbal / Phone Orders: No Diagnosis Coding ICD-10 Coding Code Description N30.41 Irradiation cystitis with hematuria Z85.46 Personal history of malignant neoplasm of prostate Follow-up Appointments Other: - continue Hyperbarics as ordered Hyperbaric Oxygen Therapy Evaluate for HBO Therapy Indication: - radiation cystitis  If appropriate for treatment, begin HBOT per protocol: 2.5 ATA for 90 Minutes with 2 Five (5) Minute Air Breaks Total Number of Treatments: - 40 One treatments per day (delivered Monday through Friday unless otherwise specified in Special Instructions below): Antihistamine 30 minutes prior to HBO Treatment, difficulty clearing ears. Finger stick Blood Glucose Pre- and Post- HBOT Treatment. Follow Hyperbaric Oxygen Glycemia Protocol GLYCEMIA INTERVENTIONS PROTOCOL PRE-HBO GLYCEMIA INTERVENTIONS ACTION INTERVENTION Obtain pre-HBO capillary blood 1 glucose (ensure physician order is in chart). A. Notify HBO physician and await physician orders. 2 If result is 70 mg/dl or below: B. If the result meets the hospital definition of a critical result, follow hospital policy. A. Give patient an 8 ounce Glucerna Shake, an 8 ounce Ensure, or 8 ounces of a Glucerna/Ensure equivalent dietary supplement*. B. Wait 30 minutes. C. Retest patients capillary blood If result is 71 mg/dl to 130 mg/dl: glucose (CBG). D. If result greater than or equal to 110 mg/dl, proceed with HBO. If result less than 110 mg/dl, notify HBO physician and consider holding HBO. If result is 131 mg/dl to 249 mg/dl: A. Proceed with HBO. A. Notify HBO physician and await physician orders. B. It is recommended to hold HBO If result is 250 mg/dl or greater: and do blood/urine ketone testing. C. If the result meets the hospital definition of a critical result, follow hospital policy. POST-HBO GLYCEMIA INTERVENTIONS ACTION INTERVENTION Obtain post HBO capillary  blood 1 glucose (ensure physician order is in chart). A. Notify HBO physician and await physician orders. 2 If result is 70 mg/dl or below: B. If the result meets the hospital definition of a critical result, follow hospital policy. A. Give patient an 8 ounce Glucerna Shake, an 8 ounce Ensure, or 8 ounces of a Glucerna/Ensure equivalent dietary supplement*. B. Wait 15 minutes for symptoms of hypoglycemia (i.e. nervousness, anxiety, sweating, chills, If result is 71 mg/dl to 100 mg/dl: clamminess, irritability, confusion, tachycardia or dizziness). C. If patient asymptomatic, discharge patient. If patient symptomatic, repeat capillary blood glucose (CBG) and notify HBO physician. If result is 101 mg/dl to 249 mg/dl: A. Discharge patient. A. Notify HBO physician and await physician orders. B. It is recommended to do If result is 250 mg/dl or greater: blood/urine ketone testing. C. If the result meets the hospital definition of a critical result, follow hospital policy. *Juice or candies are NOT equivalent products. If patient refuses the Glucerna or Ensure, please consult the hospital dietitian for an appropriate substitute. Electronic Signature(s) Signed: 12/10/2018 5:45:12 PM By: Linton Ham MD Signed: 12/14/2018 5:46:53 PM By: [name] RN, BSN Entered By: [name] on 12/10/2018 08:23:31 -------------------------------------------------------------------------------- Problem List Details Patient Name: Date of Service: IKAIKA, BHATIA 12/10/2018 7:30 AM Medical Record [id number] Patient Account Number: [id number] Date of Birth/Sex: Treating RN: 1961-01-08 (58 y.o. [name] Primary Care Provider: [name] Other Clinician: Referring Provider: Treating Provider/Extender:Glendola Friedhoff, [name], [name] Active Problems ICD-10 Evaluated Encounter Code Description Active Date Today Diagnosis N30.41 Irradiation  cystitis with hematuria 10/20/2018 No Yes Z85.46 Personal history of malignant neoplasm of prostate 10/20/2018 No Yes Inactive Problems Resolved Problems Electronic Signature(s) Signed: 12/10/2018 5:45:12 PM By: Linton Ham MD Entered By: Linton Ham on 12/10/2018 08:37:34 -------------------------------------------------------------------------------- Progress Note Details Patient Name: Date of Service: HEMANT, LAFON 12/10/2018 7:30 AM Medical Record [id number] Patient Account Number: [id number] Date of Birth/Sex: Treating RN: [birth year] (58 y.o. [name] Primary Care Provider: [name] Other Clinician: Referring Provider: Treating Provider/Extender:Dametra Whetsel, [name], Alyson Locket  in Treatment: 7 Subjective History of Present Illness (HPI) ADMISSION 10/20/2018 This is a 58 year old man who comes to clinic for consideration of hyperbaric oxygen therapy for radiation cystitis. The patient was treated for T2 N0 prostate cancer with a radical prostatectomy and adjunctive radiation in 2016 at [facility]. We do not have access to these notes at this time. The patient states that he did well up until 2 months ago. He started to develop painless gross hematuria. This is episodic he finds that if there is a strenuous issue such as coughing laughing that he is likely to develop hematuria with next void. He is followed by Dr. Link Snuffer urology at [facility] urology. They note that the patient has intermittent gross hematuria and that he is passed a few clots. A cystoscopy was done in July that was negative. He also had a CT IVP that was also negative. His most recent PSA was 0.134 on 7/13. The patient denies any dysuria, hesitancy or obstructive voiding. He is otherwise well. Past medical history is most relevant for a history of coronary artery disease with a history of coronary artery stents. His Brilinta was held because of the bleeding with the approval of  his cardiologist Dr. [name]. He also has hypertension, hyperlipidemia, history of cognitive impairment, postsurgical DVT, His last radiation treatment was apparently in December 2016 and the last visit to radiation oncology was in August 2018 11/5 the patient was seen today predominantly because of requirements of his insurance [employer] for ongoing monitoring of the effectiveness of hyperbaric therapy before continuing treatment. I spoke with the patient today. He states that he is have only one episode of gross hematuria since starting treatment. He thinks that he is having less urinary urgency less frequency and less nocturia. He has had no clots no urinary obstruction and no dysuria. He is handling the treatment very well. Objective Constitutional Sitting or standing Blood Pressure is within target range for patient.. Pulse regular and within target range for patient.Marland Kitchen Respirations regular, non-labored and within target range.. Temperature is normal and within the target range for the patient.. The patient looks well.. Vitals Time Taken: 7:51 AM, Height: 70 in, Weight: 255 lbs, BMI: 36.6, Temperature: 98.5 F, Pulse: 65 bpm, Respiratory Rate: 17 breaths/min, Blood Pressure: 130/75 mmHg, Capillary Blood Glucose: 165 mg/dl. General Notes: patient reported last CBG was 165 Genitourinary (GU) No bladder distention. Assessment Active Problems ICD-10 Irradiation cystitis with hematuria Personal history of malignant neoplasm of prostate Plan Follow-up Appointments: Other: - continue Hyperbarics as ordered Hyperbaric Oxygen Therapy: Evaluate for HBO Therapy Indication: - radiation cystitis If appropriate for treatment, begin HBOT per protocol: 2.5 ATA for 90 Minutes with 2 Five (5) Minute Air Breaks Total Number of Treatments: - 40 One treatments per day (delivered Monday through Friday unless otherwise specified in Special Instructions below): Antihistamine 30 minutes prior to  HBO Treatment, difficulty clearing ears. Finger stick Blood Glucose Pre- and Post- HBOT Treatment. Follow Hyperbaric Oxygen Glycemia Protocol 1. The patient has completed 14 out of 40 dives. He is doing a well. A single episode of hematuria. This was largely intermittent to begin with however the improvement in his lower urinary tract symptoms such as frequency and urgency as well as nocturia is encouraging. He is tolerating hyperbarics well. I have advised the patient that it would be advisable to continue treatment and he agrees Engineer, maintenance) Signed: 12/10/2018 5:45:12 PM By: Linton Ham MD Entered By: Linton Ham on 12/10/2018 08:44:47 -------------------------------------------------------------------------------- SuperBill Details  Patient Name: Date of Service: RAFEEQ, BACH 12/10/2018 Medical Record [id number] Patient Account Number: [id number] Date of Birth/Sex: Treating RN: 03-20-1960 (58 y.o. [name] Primary Care Provider: [name] Other Clinician: Referring Provider: Treating Provider/Extender:Mittie Knittel, [name], [name] Diagnosis Coding ICD-10 Codes Code Description N30.41 Irradiation cystitis with hematuria Z85.46 Personal history of malignant neoplasm of prostate Facility Procedures CPT4 Code: [id number] Description: [id number] - WOUND CARE VISIT-LEV 2 EST PT Modifier: 1 Quantity: Physician Procedures CPT4 Code: [id number] Description: [id number] - WC PHYS LEVEL 2 - EST PT ICD-10 Diagnosis Description N30.41 Irradiation cystitis with hematuria Z85.46 Personal history of malignant neoplasm of prosta Modifier: te Quantity: 1 Electronic Signature(s) Signed: 12/10/2018 5:45:12 PM By: Linton Ham MD Entered By: Linton Ham on 12/10/2018 08:45:00

## 2018-12-15 ENCOUNTER — Encounter (HOSPITAL_BASED_OUTPATIENT_CLINIC_OR_DEPARTMENT_OTHER): Payer: Medicare HMO | Admitting: Internal Medicine

## 2018-12-15 DIAGNOSIS — N3041 Irradiation cystitis with hematuria: Secondary | ICD-10-CM | POA: Diagnosis not present

## 2018-12-15 LAB — GLUCOSE, CAPILLARY
Glucose-Capillary: 170 mg/dL — ABNORMAL HIGH (ref 70–99)
Glucose-Capillary: 120 mg/dL — ABNORMAL HIGH (ref 70–99)

## 2018-12-15 NOTE — Progress Notes (Signed)
OZZIEL, KOBZA ([id number]) Visit Report for 12/15/2018 Arrival Information Details Patient Name: Date of Service: NICKALIS, WARRELL 12/15/2018 8:00 AM Medical Record [id number] Patient Account Number: [id number] Date of Birth/Sex: Treating RN: Jan 31, 1961 (58 y.o. [name]) Carlene Coria Primary Care Adrieanna Boteler: [name] Other Clinician: Referring Helina Hullum: Treating Shauniece Kwan/Extender:Robson, [name], Alyson Locket in Treatment: 8 Visit Information History Since Last Visit Added or deleted any medications: No Patient Arrived: Ambulatory Any new allergies or adverse reactions: No Arrival Time: 07:50 Had a fall or experienced change in No Accompanied By: self activities of daily living that may affect Transfer Assistance: None risk of falls: Patient Identification Verified: Yes Signs or symptoms of abuse/neglect since last No Secondary Verification Process Yes visito Completed: Hospitalized since last visit: No Patient Requires Transmission-Based No Implantable device outside of the clinic excluding No Precautions: cellular tissue based products placed in the center Patient Has Alerts: No since last visit: Pain Present Now: No Electronic Signature(s) Signed: 12/15/2018 4:05:15 PM By: Mikeal Hawthorne EMT/HBOT Entered By: Mikeal Hawthorne on 12/15/2018 08:17:22 -------------------------------------------------------------------------------- Encounter Discharge Information Details Patient Name: Date of Service: JSEAN, STOCKHAM 12/15/2018 8:00 AM Medical Record [id number] Patient Account Number: [id number] Date of Birth/Sex: Treating RN: 1960/02/08 (58 y.o. Oval Linsey Primary Care Adaja Wander: [name] Other Clinician: Referring Keyera Hattabaugh: Treating Carmisha Larusso/Extender:Robson, [name], Alyson Locket in Treatment: 8 Encounter Discharge Information Items Discharge Condition: Stable Ambulatory Status: Ambulatory Discharge Destination: Home Transportation: Private  Auto Accompanied By: self Schedule Follow-up Appointment: Yes Clinical Summary of Care: Patient Declined Electronic Signature(s) Signed: 12/15/2018 4:05:15 PM By: Mikeal Hawthorne EMT/HBOT Entered By: Mikeal Hawthorne on 12/15/2018 11:47:14 -------------------------------------------------------------------------------- Patient/Caregiver Education Details Patient Name: [name] 11/10/2020andnbsp8:00 Date of Service: AM Medical Record [id number] Number: Patient Account Number: [id number] Treating RN: 1961/01/23 (58 y.o. Carlene Coria Date of Birth/Gender: M) Other Clinician: Primary Care Physician: [name] Treating Linton Ham Referring Physician: Physician/Extender: Donnajean Lopes in Treatment: 8 Education Assessment Education Provided To: Patient Education Topics Provided Hyperbaric Oxygenation: Methods: Explain/Verbal Responses: State content correctly Electronic Signature(s) Signed: 12/15/2018 4:05:15 PM By: Mikeal Hawthorne EMT/HBOT Entered By: Mikeal Hawthorne on 12/15/2018 11:47:03 -------------------------------------------------------------------------------- Vitals Details Patient Name: Date of Service: ATREYA, TERHAAR 12/15/2018 8:00 AM Medical Record [id number] Patient Account Number: [id number] Date of Birth/Sex: Treating RN: 11-16-1960 (58 y.o. [name]) Carlene Coria Primary Care Sharronda Schweers: [name] Other Clinician: Referring Takeshia Wenk: Treating Ericca Labra/Extender:Robson, [name], Alyson Locket in Treatment: 8 Vital Signs Time Taken: 07:55 Temperature (F): 97.2 Height (in): 70 Pulse (bpm): 66 Weight (lbs): 255 Respiratory Rate (breaths/min): 16 Body Mass Index (BMI): 36.6 Blood Pressure (mmHg): 166/96 Capillary Blood Glucose (mg/dl): 170 Reference Range: 80 - 120 mg / dl Electronic Signature(s) Signed: 12/15/2018 4:05:15 PM By: Mikeal Hawthorne EMT/HBOT Entered By: Mikeal Hawthorne on 12/15/2018 08:17:40

## 2018-12-15 NOTE — Progress Notes (Addendum)
[name], [name] ([id number]) Visit Report for 12/15/2018 HBO Details Patient Name: Date of Service: [name], [name] 12/15/2018 8:00 AM Medical Record [id number] Patient Account Number: [id number] Date of Birth/Sex: Treating RN: February 04, 1961 (58 y.o. [name]) Carlene Coria Primary Care Shavonte Zhao: [name] Other Clinician: Referring Sylvania Moss: Treating Benjamim Harnish/Extender:Robson, [name], Alyson Locket in Treatment: 8 HBO Treatment Course Details Treatment Course Number: 1 Ordering Fatiha Guzy: Linton Ham Total Treatments Ordered: 40 HBO Treatment Start Date: 11/16/2018 HBO Indication: Late Effect of Radiation HBO Treatment Details Treatment Number: 17 Patient Type: Outpatient Chamber Type: Monoplace Chamber Serial #: [id number] Treatment Protocol: 2.5 ATA with 90 minutes oxygen, with two 5 minute air breaks Treatment Details Compression Rate Down: 2.0 psi / minute De-Compression Rate Up: 2.0 psi / minute Air breaks and CompressTx Pressure breathing periods DecompressDecompress Begins Reached (leave unused spaces Begins Ends blank) Chamber Pressure (ATA)1 2.5 2.5 2.5 2.5 2.5 --2.5 1 Clock Time (24 hr) 07:56 08:08 [id number]:3808:4309:1309:18--09:48 10:00 Treatment Length: 124 (minutes) Treatment Segments: 4 Vital Signs Capillary Blood Glucose Reference Range: 80 - 120 mg / dl HBO Diabetic Blood Glucose Intervention Range: <131 mg/dl or >249 mg/dl Time Vitals Blood Respiratory Capillary Blood Glucose Pulse Action Type: Pulse: Temperature: Taken: Pressure: Rate: Glucose (mg/dl): Meter #: Oximetry (%) Taken: Pre 07:55 166/96 66 16 97.2 170 Post 10:02 154/90 58 15 97.6 120 Treatment Response Treatment Toleration: Well Treatment Completion Treatment Completed without Adverse Event Status: Wash Nienhaus Notes No concerns with treatment given Physician HBO Attestation: I certify that I supervised this HBO treatment in accordance with Medicare guidelines. A trained Yes Yes emergency  response team is readily available per hospital policies and procedures. Continue HBOT as ordered. Yes Electronic Signature(s) Signed: 12/15/2018 5:17:49 PM By: Linton Ham MD Previous Signature: 12/15/2018 4:05:15 PM Version By: Mikeal Hawthorne EMT/HBOT Entered By: Linton Ham on 12/15/2018 16:39:16 -------------------------------------------------------------------------------- HBO Safety Checklist Details Patient Name: Date of Service: [name], [name] 12/15/2018 8:00 AM Medical Record [id number] Patient Account Number: [id number] Date of Birth/Sex: Treating RN: 07-22-1960 (58 y.o. [name]) Carlene Coria Primary Care Zeus Marquis: [name] Other Clinician: Referring Calayah Guadarrama: Treating Kerrie Latour/Extender:Robson, [name], Alyson Locket in Treatment: 8 HBO Safety Checklist Items Safety Checklist Consent Form Signed Patient voided / foley secured and emptied When did you last eato 0700 - coffee Last dose of injectable or oral agent metformin NA Ostomy pouch emptied and vented if applicable NA All implantable devices assessed, documented and approved NA Intravenous access site secured and place Valuables secured Linens and cotton and cotton/polyester blend (less than 51% polyester) Personal oil-based products / skin lotions / body lotions removed NA Wigs or hairpieces removed NA Smoking or tobacco materials removed Books / newspapers / magazines / loose paper removed Cologne, aftershave, perfume and deodorant removed Jewelry removed (may wrap wedding band) NA Make-up removed Hair care products removed NA Battery operated devices (external) removed NA Heating patches and chemical warmers removed NA Titanium eyewear removed NA Nail polish cured greater than 10 hours NA Casting material cured greater than 10 hours NA Hearing aids removed Loose dentures or partials removed NA Prosthetics have been removed Patient demonstrates correct use of air break device (if  applicable) Patient concerns have been addressed Patient grounding bracelet on and cord attached to chamber Specifics for Inpatients (complete in addition to above) Medication sheet sent with patient Intravenous medications needed or due during therapy sent with patient Drainage tubes (e.g. nasogastric tube or chest tube secured and vented) Endotracheal or Tracheotomy tube secured Cuff deflated of air and inflated with saline Airway  suctioned Electronic Signature(s) Signed: 12/15/2018 8:18:23 AM By: Mikeal Hawthorne EMT/HBOT Entered By: Mikeal Hawthorne on 12/15/2018 08:18:23

## 2018-12-15 NOTE — Progress Notes (Signed)
YUNIS, ELG ([id number]) Visit Report for 12/15/2018 SuperBill Details Patient Name: Date of Service: ATHONY, BEIGHLEY 12/15/2018 Medical Record [id number] Patient Account Number: [id number] Date of Birth/Sex: Treating RN: 25-Feb-1960 (58 y.o. [name]) Carlene Coria Primary Care Provider: [name] Other Clinician: Referring Provider: Treating Provider/Extender:Robson, [name], Alyson Locket in Treatment: 8 Diagnosis Coding ICD-10 Codes Code Description N30.41 Irradiation cystitis with hematuria Z85.46 Personal history of malignant neoplasm of prostate Facility Procedures CPT4 Code Description Modifier Quantity [id number]-(Facility Use Only) HBOT, full body chamber, 73min 4 Physician Procedures CPT4 Code Description Modifier Quantity [id number] - WC PHYS HYPERBARIC OXYGEN THERAPY 1 ICD-10 Diagnosis Description N30.41 Irradiation cystitis with hematuria Electronic Signature(s) Signed: 12/15/2018 4:05:15 PM By: Mikeal Hawthorne EMT/HBOT Signed: 12/15/2018 5:17:49 PM By: Linton Ham MD Entered By: Mikeal Hawthorne on 12/15/2018 11:46:52

## 2018-12-16 ENCOUNTER — Encounter (HOSPITAL_BASED_OUTPATIENT_CLINIC_OR_DEPARTMENT_OTHER): Payer: Medicare HMO | Admitting: Physician Assistant

## 2018-12-16 ENCOUNTER — Other Ambulatory Visit: Payer: Self-pay

## 2018-12-16 DIAGNOSIS — N3041 Irradiation cystitis with hematuria: Secondary | ICD-10-CM | POA: Diagnosis not present

## 2018-12-16 LAB — GLUCOSE, CAPILLARY
Glucose-Capillary: 163 mg/dL — ABNORMAL HIGH (ref 70–99)
Glucose-Capillary: 138 mg/dL — ABNORMAL HIGH (ref 70–99)

## 2018-12-16 NOTE — Progress Notes (Signed)
DUFFY, BESANCON ([id number]) Visit Report for 12/16/2018 Problem List Details Patient Name: Date of Service: [name], [name] 12/16/2018 8:00 AM Medical Record [id number] Patient Account Number: [id number] Date of Birth/Sex: Treating RN: 1960-03-06 (58 y.o. [name] Primary Care Provider: [name] Other Clinician: Referring Provider: Treating Provider/Extender:Stone III, [name], [name] Weeks in Treatment: 8 Active Problems ICD-10 Evaluated Encounter Code Description Active Date Today Diagnosis N30.41 Irradiation cystitis with hematuria 10/20/2018 No Yes Z85.46 Personal history of malignant neoplasm of prostate 10/20/2018 No Yes Inactive Problems Resolved Problems Electronic Signature(s) Signed: 12/16/2018 5:02:14 PM By: [name] PA-C Entered By: [name] on 12/16/2018 16:59:21 -------------------------------------------------------------------------------- SuperBill Details Patient Name: Date of Service: [name], [name] 12/16/2018 Medical Record [id number] Patient Account Number: [id number] Date of Birth/Sex: Treating RN: September 05, 1960 (58 y.o. [name] Primary Care Provider: [name] Other Clinician: Referring Provider: Treating Provider/Extender:Stone III, [name], [name] Weeks in Treatment: 8 Diagnosis Coding ICD-10 Codes Code Description N30.41 Irradiation cystitis with hematuria Z85.46 Personal history of malignant neoplasm of prostate Facility Procedures CPT4 Code Description: [id number] G0277-(Facility Use Only) HBOT, full body chamber, 52min Modifier: Quantity: 4 Physician Procedures CPT4 Code Description: [id number] - WC PHYS HYPERBARIC OXYGEN THERAPY ICD-10 Diagnosis Description N30.41 Irradiation cystitis with hematuria Modifier: Quantity: 1 Electronic Signature(s) Signed: 12/16/2018 5:02:14 PM By: [name] PA-C Previous Signature: 12/16/2018 4:27:20 PM Version By: Mikeal Hawthorne EMT/HBOT Entered  By: [name] on 12/16/2018 16:59:19

## 2018-12-16 NOTE — Progress Notes (Signed)
AVAAN, VINK ([id number]) Visit Report for 12/16/2018 Arrival Information Details Patient Name: Date of Service: [name], [name] 12/16/2018 8:00 AM Medical Record [id number] Patient Account Number: [id number] Date of Birth/Sex: Treating RN: [birth year] (58 y.o. [name] Primary Care Raniah Karan: [name] Other Clinician: Referring Cayley Pester: Treating Srikar Chiang/Extender:Stone III, [name], [name] Weeks in Treatment: 8 Visit Information History Since Last Visit Added or deleted any medications: No Patient Arrived: Ambulatory Any new allergies or adverse reactions: No Arrival Time: 07:45 Had a fall or experienced change in No Accompanied By: self activities of daily living that may affect Transfer Assistance: None risk of falls: Patient Identification Verified: Yes Signs or symptoms of abuse/neglect since last No Secondary Verification Process Yes visito Completed: Hospitalized since last visit: No Patient Requires Transmission-Based No Implantable device outside of the clinic excluding No Precautions: cellular tissue based products placed in the center Patient Has Alerts: No since last visit: Pain Present Now: No Electronic Signature(s) Signed: 12/16/2018 4:27:20 PM By: Mikeal Hawthorne EMT/HBOT Entered By: Mikeal Hawthorne on 12/16/2018 08:27:05 -------------------------------------------------------------------------------- Encounter Discharge Information Details Patient Name: Date of Service: [name] 12/16/2018 8:00 AM Medical Record [id number] Patient Account Number: [id number] Date of Birth/Sex: Treating RN: [birth year] (58 y.o. [name] Primary Care Chad Donoghue: [name] Other Clinician: Referring Alysiana Ethridge: Treating Ericia Moxley/Extender:Stone III, [name], [name] Weeks in Treatment: 8 Encounter Discharge Information Items Discharge Condition: Stable Ambulatory Status: Ambulatory Discharge Destination: Home Transportation:  Private Auto Accompanied By: self Schedule Follow-up Appointment: Yes Clinical Summary of Care: Patient Declined Electronic Signature(s) Signed: 12/16/2018 4:27:20 PM By: Mikeal Hawthorne EMT/HBOT Entered By: Mikeal Hawthorne on 12/16/2018 10:34:47 -------------------------------------------------------------------------------- Patient/Caregiver Education Details Patient Name: [name] 11/11/2020andnbsp8:00 Date of Service: AM Medical Record [id number] Number: Patient Account Number: [id number] Treating RN: [birth year] (58 y.o. Baruch Gouty Date of Birth/Gender: M) Other Clinician: Primary Care Physician: [name] Treating [name] Referring Physician: Physician/Extender: Donnajean Lopes in Treatment: 8 Education Assessment Education Provided To: Patient Education Topics Provided Hyperbaric Oxygenation: Methods: Explain/Verbal Responses: State content correctly Electronic Signature(s) Signed: 12/16/2018 4:27:20 PM By: Mikeal Hawthorne EMT/HBOT Entered By: Mikeal Hawthorne on 12/16/2018 10:34:36 -------------------------------------------------------------------------------- Vitals Details Patient Name: Date of Service: [name] 12/16/2018 8:00 AM Medical Record [id number] Patient Account Number: [id number] Date of Birth/Sex: Treating RN: 1960/12/30 (58 y.o. [name] Primary Care Carine Nordgren: [name] Other Clinician: Referring Jarrod Bodkins: Treating Emmerie Battaglia/Extender:Stone III, [name], [name] Weeks in Treatment: 8 Vital Signs Time Taken: 07:50 Temperature (F): 97.9 Height (in): 70 Pulse (bpm): 59 Weight (lbs): 255 Respiratory Rate (breaths/min): 17 Body Mass Index (BMI): 36.6 Blood Pressure (mmHg): 156/96 Capillary Blood Glucose (mg/dl): 163 Reference Range: 80 - 120 mg / dl Electronic Signature(s) Signed: 12/16/2018 4:27:20 PM By: Mikeal Hawthorne EMT/HBOT Entered By: Mikeal Hawthorne on 12/16/2018 08:27:42

## 2018-12-16 NOTE — Progress Notes (Addendum)
MICHAL, BENESH ([id number]) Visit Report for 12/16/2018 HBO Details Patient Name: Date of Service: [name], [name] 12/16/2018 8:00 AM Medical Record [id number] Patient Account Number: [id number] Date of Birth/Sex: Treating RN: 1960/12/13 (58 y.o. [name] Primary Care Francina Beery: [name] Other Clinician: Referring Karena Kinker: Treating Gabriell Casimir/Extender:Stone III, [name], [name] Weeks in Treatment: 8 HBO Treatment Course Details Treatment Course Number: 1 Ordering Earlene Bjelland: Linton Ham Total Treatments Ordered: 40 HBO Treatment Start Date: 11/16/2018 HBO Indication: Late Effect of Radiation HBO Treatment Details Treatment Number: 18 Patient Type: Outpatient Chamber Type: Monoplace Chamber Serial #: [id number] Treatment Protocol: 2.5 ATA with 90 minutes oxygen, with two 5 minute air breaks Treatment Details Compression Rate Down: 2.0 psi / minute De-Compression Rate Up: 2.0 psi / minute Air breaks and CompressTx Pressure breathing periods DecompressDecompress Begins Reached (leave unused spaces Begins Ends blank) Chamber Pressure (ATA)1 2.5 2.5 2.5 2.5 2.5 --2.5 1 Clock Time (24 hr) 08:07 08:19 [id number] 10:11 Treatment Length: 124 (minutes) Treatment Segments: 4 Vital Signs Capillary Blood Glucose Reference Range: 80 - 120 mg / dl HBO Diabetic Blood Glucose Intervention Range: <131 mg/dl or >249 mg/dl Time Vitals Blood Respiratory Capillary Blood Glucose Pulse Action Type: Pulse: Temperature: Taken: Pressure: Rate: Glucose (mg/dl): Meter #: Oximetry (%) Taken: Pre 07:50 156/96 59 17 97.9 163 Post 10:13 164/96 59 15 98.1 138 Treatment Response Treatment Toleration: Well Treatment Completion Treatment Completed without Adverse Event Status: Physician HBO Attestation: I certify that I supervised this HBO treatment in accordance with Medicare guidelines. A trained Yes emergency response team is readily available per hospital  policies and procedures. Continue HBOT as ordered. Yes Electronic Signature(s) Signed: 12/16/2018 5:02:14 PM By: [name] PA-C Previous Signature: 12/16/2018 4:27:20 PM Version By: Mikeal Hawthorne EMT/HBOT Entered By: [name] on 12/16/2018 16:59:15 -------------------------------------------------------------------------------- HBO Safety Checklist Details Patient Name: Date of Service: [name], [name] 12/16/2018 8:00 AM Medical Record [id number] Patient Account Number: [id number] Date of Birth/Sex: Treating RN: 1960/11/24 (58 y.o. [name] Primary Care Alphonsa Brickle: [name] Other Clinician: Referring Amayrani Bennick: Treating Qadir Folks/Extender:Stone III, [name], [name] Weeks in Treatment: 8 HBO Safety Checklist Items Safety Checklist Consent Form Signed Patient voided / foley secured and emptied When did you last eato n/a Last dose of injectable or oral agent metformin NA Ostomy pouch emptied and vented if applicable NA All implantable devices assessed, documented and approved NA Intravenous access site secured and place Valuables secured Linens and cotton and cotton/polyester blend (less than 51% polyester) Personal oil-based products / skin lotions / body lotions removed NA Wigs or hairpieces removed NA Smoking or tobacco materials removed Books / newspapers / magazines / loose paper removed Cologne, aftershave, perfume and deodorant removed Jewelry removed (may wrap wedding band) NA Make-up removed Hair care products removed NA Battery operated devices (external) removed NA Heating patches and chemical warmers removed NA Titanium eyewear removed NA Nail polish cured greater than 10 hours NA Casting material cured greater than 10 hours NA Hearing aids removed Loose dentures or partials removed NA Prosthetics have been removed Patient demonstrates correct use of air break device (if applicable) Patient concerns have been addressed Patient  grounding bracelet on and cord attached to chamber Specifics for Inpatients (complete in addition to above) Medication sheet sent with patient Intravenous medications needed or due during therapy sent with patient Drainage tubes (e.g. nasogastric tube or chest tube secured and vented) Endotracheal or Tracheotomy tube secured Cuff deflated of air and inflated with saline Airway suctioned Electronic Signature(s) Signed: 12/16/2018 8:28:27  AM By: Mikeal Hawthorne EMT/HBOT Entered By: Mikeal Hawthorne on 12/16/2018 [id number]

## 2018-12-17 ENCOUNTER — Encounter (HOSPITAL_BASED_OUTPATIENT_CLINIC_OR_DEPARTMENT_OTHER): Payer: Medicare HMO | Admitting: Internal Medicine

## 2018-12-18 ENCOUNTER — Encounter (HOSPITAL_BASED_OUTPATIENT_CLINIC_OR_DEPARTMENT_OTHER): Payer: Medicare HMO | Admitting: Internal Medicine

## 2018-12-18 ENCOUNTER — Other Ambulatory Visit: Payer: Self-pay

## 2018-12-18 DIAGNOSIS — N3041 Irradiation cystitis with hematuria: Secondary | ICD-10-CM | POA: Diagnosis not present

## 2018-12-18 LAB — GLUCOSE, CAPILLARY
Glucose-Capillary: 208 mg/dL — ABNORMAL HIGH (ref 70–99)
Glucose-Capillary: 141 mg/dL — ABNORMAL HIGH (ref 70–99)

## 2018-12-18 NOTE — Progress Notes (Signed)
RANDYL, ARRANT ([id number]) Visit Report for 12/18/2018 SuperBill Details Patient Name: Date of Service: [name], [name] 12/18/2018 Medical Record [id number] Patient Account Number: [id number] Date of Birth/Sex: Treating RN: [birth year] (58 y.o. Marvis Repress Primary Care Provider: [name] Other Clinician: Referring Provider: Treating Provider/Extender:Robson, [name], Alyson Locket in Treatment: 8 Diagnosis Coding ICD-10 Codes Code Description N30.41 Irradiation cystitis with hematuria Z85.46 Personal history of malignant neoplasm of prostate Facility Procedures CPT4 Code Description Modifier Quantity [id number] G0277-(Facility Use Only) HBOT, full body chamber, 21min 4 Physician Procedures CPT4 Code Description Modifier Quantity [id number] - WC PHYS HYPERBARIC OXYGEN THERAPY 1 ICD-10 Diagnosis Description N30.41 Irradiation cystitis with hematuria Electronic Signature(s) Signed: 12/18/2018 4:02:07 PM By: Mikeal Hawthorne EMT/HBOT Signed: 12/18/2018 5:33:43 PM By: Linton Ham MD Entered By: Mikeal Hawthorne on 12/18/2018 10:39:20

## 2018-12-18 NOTE — Progress Notes (Signed)
[name] ([id number]) Visit Report for 12/18/2018 Arrival Information Details Patient Name: Date of Service: [name] 12/18/2018 8:00 AM Medical Record [id number] Patient Account Number: [id number] Date of Birth/Sex: Treating RN: 14-Jul-1960 (58 y.o. [name] Primary Care [name]: [name] Other Clinician: Referring Karee Christopherson: Treating Saliyah Gillin/Extender:Robson, [name], Alyson Locket in Treatment: 8 Visit Information History Since Last Visit Added or deleted any medications: No Patient Arrived: Ambulatory Any new allergies or adverse reactions: No Arrival Time: 07:55 Had a fall or experienced change in No Accompanied By: self activities of daily living that may affect Transfer Assistance: None risk of falls: Patient Identification Verified: Yes Signs or symptoms of abuse/neglect since last No Secondary Verification Process Yes visito Completed: Hospitalized since last visit: No Patient Requires Transmission-Based No Implantable device outside of the clinic excluding No Precautions: cellular tissue based products placed in the center Patient Has Alerts: No since last visit: Pain Present Now: No Electronic Signature(s) Signed: 12/18/2018 4:02:07 PM By: Mikeal Hawthorne EMT/HBOT Entered By: Mikeal Hawthorne on 12/18/2018 08:24:25 -------------------------------------------------------------------------------- Encounter Discharge Information Details Patient Name: Date of Service: [name] 12/18/2018 8:00 AM Medical Record [id number] Patient Account Number: [id number] Date of Birth/Sex: Treating RN: Oct 19, 1960 (58 y.o. [name] Primary Care [name]: [name] Other Clinician: Referring Inaki Vantine: Treating Merel Santoli/Extender:Robson, [name], Alyson Locket in Treatment: 8 Encounter Discharge Information Items Discharge Condition: Stable Ambulatory Status: Ambulatory Discharge Destination:  Home Transportation: Private Auto Accompanied By: self Schedule Follow-up Appointment: Yes Clinical Summary of Care: Patient Declined Electronic Signature(s) Signed: 12/18/2018 4:02:07 PM By: Mikeal Hawthorne EMT/HBOT Entered By: Mikeal Hawthorne on 12/18/2018 10:39:42 -------------------------------------------------------------------------------- Patient/Caregiver Education Details Patient Name: [name] 11/13/2020andnbsp8:00 Date of Service: AM Medical Record [id number] Number: Patient Account Number: [id number] Treating RN: Sep 06, 1960 (58 y.o. [name] Date of Birth/Gender: M) Other Clinician: Primary Care Physician: [name] Treating [name] Referring Physician: Physician/Extender: Donnajean Lopes in Treatment: 8 Education Assessment Education Provided To: Patient Education Topics Provided Hyperbaric Oxygenation: Methods: Explain/Verbal Responses: State content correctly Electronic Signature(s) Signed: 12/18/2018 4:02:07 PM By: Mikeal Hawthorne EMT/HBOT Entered By: Mikeal Hawthorne on 12/18/2018 10:39:32 -------------------------------------------------------------------------------- Vitals Details Patient Name: Date of Service: [name] 12/18/2018 8:00 AM Medical Record [id number] Patient Account Number: [id number] Date of Birth/Sex: Treating RN: 1961-01-26 (58 y.o. [name] Primary Care Maher Shon: [name] Other Clinician: Referring Chameka Mcmullen: Treating Flor Whitacre/Extender:Robson, [name], Alyson Locket in Treatment: 8 Vital Signs Time Taken: 08:00 Temperature (F): 97.1 Height (in): 70 Pulse (bpm): 53 Weight (lbs): 255 Respiratory Rate (breaths/min): 16 Body Mass Index (BMI): 36.6 Blood Pressure (mmHg): 134/77 Capillary Blood Glucose (mg/dl): 208 Reference Range: 80 - 120 mg / dl Electronic Signature(s) Signed: 12/18/2018 4:02:07 PM By: Mikeal Hawthorne EMT/HBOT Entered By: Mikeal Hawthorne on 12/18/2018  08:24:40

## 2018-12-18 NOTE — Progress Notes (Addendum)
FIELDS, KAMMERZELL ([id number]) Visit Report for 12/18/2018 HBO Details Patient Name: Date of Service: [name], [name] 12/18/2018 8:00 AM Medical Record [id number] Patient Account Number: [id number] Date of Birth/Sex: Treating RN: [birth year] (58 y.o. [name] Primary Care Brystal Kildow: [name] Other Clinician: Referring Journee Kohen: Treating Maikel Neisler/Extender:Robson, [name], Alyson Locket in Treatment: 8 HBO Treatment Course Details Treatment Course Number: 1 Ordering Cressie Betzler: Linton Ham Total Treatments Ordered: 40 HBO Treatment Start Date: 11/16/2018 HBO Indication: Late Effect of Radiation HBO Treatment Details Treatment Number: 19 Patient Type: Outpatient Chamber Type: Monoplace Chamber Serial #: [id number] Treatment Protocol: 2.5 ATA with 90 minutes oxygen, with two 5 minute air breaks Treatment Details Compression Rate Down: 2.0 psi / minute De-Compression Rate Up: 2.0 psi / minute Air breaks and CompressTx Pressure breathing periods DecompressDecompress Begins Reached (leave unused spaces Begins Ends blank) Chamber Pressure (ATA)1 2.5 2.5 2.5 2.5 2.5 --2.5 1 Clock Time (24 hr) 08:10 08:22 [id number] 10:14 Treatment Length: 124 (minutes) Treatment Segments: 4 Vital Signs Capillary Blood Glucose Reference Range: 80 - 120 mg / dl HBO Diabetic Blood Glucose Intervention Range: <131 mg/dl or >249 mg/dl Time Vitals Blood Respiratory Capillary Blood Glucose Pulse Action Type: Pulse: Temperature: Taken: Pressure: Rate: Glucose (mg/dl): Meter #: Oximetry (%) Taken: Pre 08:00 134/77 53 16 97.1 208 Post 10:17 150/82 58 15 97.8 141 Treatment Response Treatment Toleration: Well Treatment Completion Treatment Completed without Adverse Event Status: Erven Ramson Notes No concerns with treatment given Physician HBO Attestation: I certify that I supervised this HBO treatment in accordance with Medicare guidelines. A  trained Yes Yes emergency response team is readily available per hospital policies and procedures. Continue HBOT as ordered. Yes Electronic Signature(s) Signed: 12/18/2018 5:33:43 PM By: Linton Ham MD Previous Signature: 12/18/2018 4:02:07 PM Version By: Mikeal Hawthorne EMT/HBOT Entered By: Linton Ham on 12/18/2018 17:30:46 -------------------------------------------------------------------------------- HBO Safety Checklist Details Patient Name: Date of Service: [name], [name] 12/18/2018 8:00 AM Medical Record [id number] Patient Account Number: [id number] Date of Birth/Sex: Treating RN: March 15, 1960 (58 y.o. [name] Primary Care Brean Carberry: [name] Other Clinician: Referring Mcarthur Ivins: Treating Anajulia Leyendecker/Extender:Robson, [name], Alyson Locket in Treatment: 8 HBO Safety Checklist Items Safety Checklist Consent Form Signed Patient voided / foley secured and emptied When did you last eato 0700 - [place] Last dose of injectable or oral agent metformin NA Ostomy pouch emptied and vented if applicable NA All implantable devices assessed, documented and approved NA Intravenous access site secured and place Valuables secured Linens and cotton and cotton/polyester blend (less than 51% polyester) Personal oil-based products / skin lotions / body lotions removed NA Wigs or hairpieces removed NA Smoking or tobacco materials removed Books / newspapers / magazines / loose paper removed Cologne, aftershave, perfume and deodorant removed Jewelry removed (may wrap wedding band) NA Make-up removed Hair care products removed NA Battery operated devices (external) removed NA Heating patches and chemical warmers removed NA Titanium eyewear removed NA Nail polish cured greater than 10 hours NA Casting material cured greater than 10 hours NA Hearing aids removed Loose dentures or partials removed NA Prosthetics have been removed Patient demonstrates correct use of  air break device (if applicable) Patient concerns have been addressed Patient grounding bracelet on and cord attached to chamber Specifics for Inpatients (complete in addition to above) Medication sheet sent with patient Intravenous medications needed or due during therapy sent with patient Drainage tubes (e.g. nasogastric tube or chest tube secured and vented) Endotracheal or Tracheotomy tube secured Cuff deflated of air and inflated with saline Airway  suctioned Electronic Signature(s) Signed: 12/18/2018 8:25:22 AM By: Mikeal Hawthorne EMT/HBOT Entered By: Mikeal Hawthorne on 12/18/2018 08:25:22

## 2018-12-21 ENCOUNTER — Other Ambulatory Visit: Payer: Self-pay

## 2018-12-21 ENCOUNTER — Encounter (HOSPITAL_BASED_OUTPATIENT_CLINIC_OR_DEPARTMENT_OTHER): Payer: Medicare HMO | Admitting: Internal Medicine

## 2018-12-21 DIAGNOSIS — N3041 Irradiation cystitis with hematuria: Secondary | ICD-10-CM | POA: Diagnosis not present

## 2018-12-21 LAB — GLUCOSE, CAPILLARY
Glucose-Capillary: 143 mg/dL — ABNORMAL HIGH (ref 70–99)
Glucose-Capillary: 120 mg/dL — ABNORMAL HIGH (ref 70–99)

## 2018-12-21 NOTE — Progress Notes (Addendum)
[name], [name] ([id number]) Visit Report for 12/21/2018 HBO Details Patient Name: Date of Service: [name], [name] 12/21/2018 8:00 AM Medical Record [id number] Patient Account Number: [id number] Date of Birth/Sex: Treating RN: 28-May-1960 (58 y.o. [name] Primary Care [name]: [name] Other Clinician: Referring [name]: Treating [name]/Extender:[name], [name], [name] in Treatment: 8 HBO Treatment Course Details Treatment Course Number: 1 Ordering [name]: [name] Total Treatments Ordered: 40 HBO Treatment Start Date: 11/16/2018 HBO Indication: Late Effect of Radiation HBO Treatment Details Treatment Number: 20 Patient Type: Outpatient Chamber Type: Monoplace Chamber Serial #: [id number] Treatment Protocol: 2.5 ATA with 90 minutes oxygen, with two 5 minute air breaks Treatment Details Compression Rate Down: 2.0 psi / minute De-Compression Rate Up: 2.0 psi / minute Air breaks and CompressTx Pressure breathing periods DecompressDecompress Begins Reached (leave unused spaces Begins Ends blank) Chamber Pressure (ATA)1 2.5 2.5 2.5 2.5 2.5 --2.5 1 Clock Time (24 hr) 08:10 08:22 [id number] 10:14 Treatment Length: 124 (minutes) Treatment Segments: 4 Vital Signs Capillary Blood Glucose Reference Range: 80 - 120 mg / dl HBO Diabetic Blood Glucose Intervention Range: <131 mg/dl or >249 mg/dl Time Vitals Blood Respiratory Capillary Blood Glucose Pulse Action Type: Pulse: Temperature: Taken: Pressure: Rate: Glucose (mg/dl): Meter #: Oximetry (%) Taken: Pre 08:00 135/82 65 17 97.2 143 Post 10:17 177/99 56 15 98.3 120 Treatment Response Treatment Toleration: Well Treatment Completion Treatment Completed without Adverse Event Status: Caylin Raby Notes No concerns with treatment given Physician HBO Attestation: I certify that I supervised this HBO treatment in accordance with Medicare guidelines. A  trained Yes Yes emergency response team is readily available per hospital policies and procedures. Continue HBOT as ordered. Yes Electronic Signature(s) Signed: 12/21/2018 5:46:17 PM By: [name] Ham MD Entered By: [name] on 12/21/2018 17:43:10 -------------------------------------------------------------------------------- HBO Safety Checklist Details Patient Name: Date of Service: [name], [name] 12/21/2018 8:00 AM Medical Record [id number] Patient Account Number: [id number] Date of Birth/Sex: Treating RN: 07/21/1960 (58 y.o. [name] Primary Care [name]: [name] Other Clinician: Referring [name]: Treating [name]/Extender:[name], [name], [name] in Treatment: 8 HBO Safety Checklist Items Safety Checklist Consent Form Signed Patient voided / foley secured and emptied When did you last eato 0700 - coffee Last dose of injectable or oral agent metformin NA Ostomy pouch emptied and vented if applicable NA All implantable devices assessed, documented and approved NA Intravenous access site secured and place Valuables secured Linens and cotton and cotton/polyester blend (less than 51% polyester) Personal oil-based products / skin lotions / body lotions removed NA Wigs or hairpieces removed NA Smoking or tobacco materials removed Books / newspapers / magazines / loose paper removed Cologne, aftershave, perfume and deodorant removed Jewelry removed (may wrap wedding band) NA Make-up removed Hair care products removed NA Battery operated devices (external) removed NA Heating patches and chemical warmers removed NA Titanium eyewear removed NA Nail polish cured greater than 10 hours NA Casting material cured greater than 10 hours NA Hearing aids removed Loose dentures or partials removed NA Prosthetics have been removed Patient demonstrates correct use of air break device (if applicable) Patient concerns have been  addressed Patient grounding bracelet on and cord attached to chamber Specifics for Inpatients (complete in addition to above) Medication sheet sent with patient Intravenous medications needed or due during therapy sent with patient Drainage tubes (e.g. nasogastric tube or chest tube secured and vented) Endotracheal or Tracheotomy tube secured Cuff deflated of air and inflated with saline Airway suctioned Electronic Signature(s) Signed: 12/21/2018 8:18:40 AM By: [name]  EMT/HBOT Entered By: [name] on 12/21/2018 08:18:40

## 2018-12-22 ENCOUNTER — Encounter (HOSPITAL_BASED_OUTPATIENT_CLINIC_OR_DEPARTMENT_OTHER): Payer: Medicare HMO | Admitting: Internal Medicine

## 2018-12-22 ENCOUNTER — Other Ambulatory Visit: Payer: Self-pay

## 2018-12-22 DIAGNOSIS — N3041 Irradiation cystitis with hematuria: Secondary | ICD-10-CM | POA: Diagnosis not present

## 2018-12-22 LAB — GLUCOSE, CAPILLARY
Glucose-Capillary: 175 mg/dL — ABNORMAL HIGH (ref 70–99)
Glucose-Capillary: 132 mg/dL — ABNORMAL HIGH (ref 70–99)

## 2018-12-22 NOTE — Progress Notes (Signed)
[name], [name] ([id number]) Visit Report for 12/21/2018 Arrival Information Details Patient Name: Date of Service: [name], [name] 12/21/2018 8:00 AM Medical Record [id number] Patient Account Number: [id number] Date of Birth/Sex: Treating RN: Jul 27, 1960 (58 y.o. [name] Primary Care Donette Mainwaring: [name] Other Clinician: Referring Rocko Fesperman: Treating Jamaya Sleeth/Extender:Robson, [name], Alyson Locket in Treatment: 8 Visit Information History Since Last Visit Added or deleted any medications: No Patient Arrived: Ambulatory Any new allergies or adverse reactions: No Arrival Time: 07:55 Had a fall or experienced change in No Accompanied By: self activities of daily living that may affect Transfer Assistance: None risk of falls: Patient Identification Verified: Yes Signs or symptoms of abuse/neglect since last No Secondary Verification Process Yes visito Completed: Hospitalized since last visit: No Patient Requires Transmission-Based No Implantable device outside of the clinic excluding No Precautions: cellular tissue based products placed in the center Patient Has Alerts: No since last visit: Pain Present Now: No Electronic Signature(s) Signed: 12/22/2018 4:00:22 PM By: Mikeal Hawthorne EMT/HBOT Entered By: Mikeal Hawthorne on 12/21/2018 08:17:38 -------------------------------------------------------------------------------- Encounter Discharge Information Details Patient Name: Date of Service: [name] 12/21/2018 8:00 AM Medical Record [id number] Patient Account Number: [id number] Date of Birth/Sex: Treating RN: 17-Sep-1960 (58 y.o. [name] Primary Care Audra Kagel: [name] Other Clinician: Referring Isabelly Kobler: Treating Lenyx Boody/Extender:Robson, [name], Alyson Locket in Treatment: 8 Encounter Discharge Information Items Discharge Condition: Stable Ambulatory Status: Ambulatory Discharge Destination: Home Transportation:  Private Auto Accompanied By: self Schedule Follow-up Appointment: Yes Clinical Summary of Care: Patient Declined Electronic Signature(s) Signed: 12/22/2018 4:00:22 PM By: Mikeal Hawthorne EMT/HBOT Entered By: Mikeal Hawthorne on 12/21/2018 10:36:02 -------------------------------------------------------------------------------- Patient/Caregiver Education Details Patient Name: [name] 11/16/2020andnbsp8:00 Date of Service: AM Medical Record [id number] Number: Patient Account Number: [id number] Treating RN: 01-12-1961 (58 y.o. [name] Date of Birth/Gender: M) Other Clinician: Primary Care Physician: [name] Treating Linton Ham Referring Physician: Physician/Extender: Donnajean Lopes in Treatment: 8 Education Assessment Education Provided To: Patient Education Topics Provided Hyperbaric Oxygenation: Methods: Explain/Verbal Responses: State content correctly Electronic Signature(s) Signed: 12/22/2018 4:00:22 PM By: Mikeal Hawthorne EMT/HBOT Entered By: Mikeal Hawthorne on 12/21/2018 10:35:51 -------------------------------------------------------------------------------- Vitals Details Patient Name: Date of Service: [name] 12/21/2018 8:00 AM Medical Record [id number] Patient Account Number: [id number] Date of Birth/Sex: Treating RN: 02/17/1960 (58 y.o. [name] Primary Care Loray Akard: [name] Other Clinician: Referring Arianny Pun: Treating Darsh Vandevoort/Extender:Robson, [name], Alyson Locket in Treatment: 8 Vital Signs Time Taken: 08:00 Temperature (F): 97.2 Height (in): 70 Pulse (bpm): 65 Weight (lbs): 255 Respiratory Rate (breaths/min): 17 Body Mass Index (BMI): 36.6 Blood Pressure (mmHg): 135/82 Capillary Blood Glucose (mg/dl): 143 Reference Range: 80 - 120 mg / dl Electronic Signature(s) Signed: 12/22/2018 4:00:22 PM By: Mikeal Hawthorne EMT/HBOT Entered By: Mikeal Hawthorne on 12/21/2018 08:17:55

## 2018-12-22 NOTE — Progress Notes (Signed)
NED, [name] ([id number]) Visit Report for 12/22/2018 Arrival Information Details Patient Name: Date of Service: [name], [name] 12/22/2018 8:00 AM Medical Record [id number] Patient Account Number: [id number] Date of Birth/Sex: Treating RN: August 27, 1960 (58 y.o. [name]) Carlene Coria Primary Care Maythe Deramo: [name] Other Clinician: Referring Jess Toney: Treating Kaelin Holford/Extender:Robson, [name], Alyson Locket in Treatment: 9 Visit Information History Since Last Visit Added or deleted any medications: No Patient Arrived: Ambulatory Any new allergies or adverse reactions: No Arrival Time: 07:55 Had a fall or experienced change in No Accompanied By: self activities of daily living that may affect Transfer Assistance: None risk of falls: Patient Identification Verified: Yes Signs or symptoms of abuse/neglect since last No Secondary Verification Process Yes visito Completed: Hospitalized since last visit: No Patient Requires Transmission-Based No Implantable device outside of the clinic excluding No Precautions: cellular tissue based products placed in the center Patient Has Alerts: No since last visit: Pain Present Now: No Electronic Signature(s) Signed: 12/22/2018 4:00:22 PM By: Mikeal Hawthorne EMT/HBOT Entered By: Mikeal Hawthorne on 12/22/2018 08:21:13 -------------------------------------------------------------------------------- Encounter Discharge Information Details Patient Name: Date of Service: [name], [name] 12/22/2018 8:00 AM Medical Record [id number] Patient Account Number: [id number] Date of Birth/Sex: Treating RN: 02-12-1960 (58 y.o. [name] Primary Care Deshon Hsiao: [name] Other Clinician: Referring Cyndia Degraff: Treating Davon Abdelaziz/Extender:Robson, [name], Alyson Locket in Treatment: 9 Encounter Discharge Information Items Discharge Condition: Stable Ambulatory Status: Ambulatory Discharge Destination: Home Transportation: Private  Auto Accompanied By: self Schedule Follow-up Appointment: Yes Clinical Summary of Care: Patient Declined Electronic Signature(s) Signed: 12/22/2018 4:00:22 PM By: Mikeal Hawthorne EMT/HBOT Entered By: Mikeal Hawthorne on 12/22/2018 10:47:02 -------------------------------------------------------------------------------- Patient/Caregiver Education Details Patient Name: [name], [name] 11/17/2020andnbsp8:00 Date of Service: AM Medical Record [id number] Number: Patient Account Number: [id number] Treating RN: [birth year] (58 y.o. Carlene Coria Date of Birth/Gender: M) Other Clinician: Primary Care Physician: [name] Treating Linton Ham Referring Physician: Physician/Extender: Donnajean Lopes in Treatment: 9 Education Assessment Education Provided To: Patient Education Topics Provided Hyperbaric Oxygenation: Methods: Explain/Verbal Responses: State content correctly Electronic Signature(s) Signed: 12/22/2018 4:00:22 PM By: Mikeal Hawthorne EMT/HBOT Entered By: Mikeal Hawthorne on 12/22/2018 10:46:52 -------------------------------------------------------------------------------- Vitals Details Patient Name: Date of Service: [name], [name] 12/22/2018 8:00 AM Medical Record [id number] Patient Account Number: [id number] Date of Birth/Sex: Treating RN: 11/09/1960 (58 y.o. [name]) Carlene Coria Primary Care Christyl Osentoski: [name] Other Clinician: Referring Kadan Millstein: Treating Deshanda Molitor/Extender:Robson, [name], Alyson Locket in Treatment: 9 Vital Signs Time Taken: 08:00 Temperature (F): 97.5 Height (in): 70 Pulse (bpm): 67 Weight (lbs): 255 Respiratory Rate (breaths/min): 15 Body Mass Index (BMI): 36.6 Blood Pressure (mmHg): 144/74 Capillary Blood Glucose (mg/dl): 175 Reference Range: 80 - 120 mg / dl Electronic Signature(s) Signed: 12/22/2018 4:00:22 PM By: Mikeal Hawthorne EMT/HBOT Entered By: Mikeal Hawthorne on 12/22/2018 08:21:27

## 2018-12-22 NOTE — Progress Notes (Deleted)
HPI: Follow-up coronary artery disease. Admitted August 2019 with non-ST elevation myocardial infarction. Cardiac catheterization at that time showed an 85% distal LAD, 80% proximal RCA, 90% mid RCA, occluded circumflex, occluded marginal. Patient had PCI of his circumflex and circumflex marginal.He subsequently had staged PCI of his right coronary artery. Echocardiogram August 2019 showed ejection fraction [id number], grade 2 diastolic dysfunction. Since last seen,   Current Outpatient Medications  Medication Sig Dispense Refill  . aspirin EC 81 MG tablet Take 81 mg by mouth daily.    Marland Kitchen atorvastatin (LIPITOR) 80 MG tablet TAKE 1 TABLET BY MOUTH ONCE DAILY AT 6PM 90 tablet 1  . Buprenorphine HCl (BELBUCA) 150 MCG FILM Place inside cheek.    . busPIRone (BUSPAR) 10 MG tablet Take 1 tablet (10 mg total) by mouth at bedtime.    . citalopram (CELEXA) 40 MG tablet Take 40 mg by mouth daily.    . diazepam (VALIUM) 5 MG tablet     . docusate sodium (COLACE) 100 MG capsule Take 100 mg by mouth 2 (two) times daily.    Marland Kitchen gabapentin (NEURONTIN) 300 MG capsule TAKE 1 CAPSULE BY MOUTH THREE TIMES DAILY    . glipiZIDE (GLUCOTROL XL) 5 MG 24 hr tablet Take 5 mg by mouth daily.    . hydrocortisone (ANUCORT-HC) 25 MG suppository Place 1 suppository rectally daily as needed for hemorrhoids or itching.     Marland Kitchen lisinopril (PRINIVIL,ZESTRIL) 10 MG tablet Take 1 tablet (10 mg total) by mouth daily. 30 tablet 4  . memantine (NAMENDA) 10 MG tablet Take 10 mg by mouth 2 (two) times daily.    . metFORMIN (GLUCOPHAGE) 500 MG tablet Take 1 tablet (500 mg total) by mouth 2 (two) times daily with a meal. 180 tablet 3  . metoprolol tartrate (LOPRESSOR) 25 MG tablet TAKE 1/2 (ONE-HALF) TABLET BY MOUTH TWICE DAILY 60 tablet 9  . mirtazapine (REMERON) 30 MG tablet Take by mouth.    . modafinil (PROVIGIL) 100 MG tablet TAKE 1 2 (ONE HALF) TO 1 TABLET BY MOUTH ONCE DAILY    . mupirocin ointment (BACTROBAN) 2 % Place 1  application into the nose 2 (two) times daily.    . nitroGLYCERIN (NITROSTAT) 0.4 MG SL tablet Place 1 tablet (0.4 mg total) under the tongue every 5 (five) minutes x 3 doses as needed for chest pain. 25 tablet 2  . polyethylene glycol (MIRALAX / GLYCOLAX) packet Take 17 g by mouth daily.    . sildenafil (VIAGRA) 100 MG tablet Take 100 mg by mouth daily as needed for erectile dysfunction.      No current facility-administered medications for this visit.      Past Medical History:  Diagnosis Date  . Anxiety   . Arthritis    "neck, shoulders, lower back" (09/15/2017)  . Carpal tunnel syndrome   . Chronic back pain    "neck, lower back" (09/15/2017)  . Depression   . DVT (deep venous thrombosis) ([place]) ~ 2016   following prostate surgery, treated with course of blood thinners.  . Erectile dysfunction   . Fibromyalgia   . Former tobacco use   . Heart murmur    "born w/one but it closed"  . Hyperlipidemia   . Hypertension   . Positive TB test   . Prostate cancer ([place]) 2016   a. s/p prostate surgery.  . Sciatica   . Spinal stenosis   . STEMI (ST elevation myocardial infarction) ([place]) 09/13/2017   PCI/DESx1 to  the LAD, with staged intervention DESx1 to the RCA, normal EF  . Type II diabetes mellitus ([place])     Past Surgical History:  Procedure Laterality Date  . ANTERIOR CERVICAL DECOMP/DISCECTOMY FUSION  03/2015  . APPENDECTOMY  1995  . BACK SURGERY    . BILATERAL CARPAL TUNNEL RELEASE Bilateral 2017   "30 days apart"  . CORONARY ANGIOGRAPHY N/A 09/15/2017   Procedure: CORONARY ANGIOGRAPHY;  Surgeon: Jettie Booze, MD;  Location: [facility];  Service: Cardiovascular;  Laterality: N/A;  . CORONARY ANGIOPLASTY WITH STENT PLACEMENT  09/15/2017  . CORONARY STENT INTERVENTION N/A 09/15/2017   Procedure: CORONARY STENT INTERVENTION;  Surgeon: Jettie Booze, MD;  Location: [facility];  Service: Cardiovascular;  Laterality: N/A;  . CORONARY/GRAFT ACUTE MI  REVASCULARIZATION N/A 09/13/2017   Procedure: Coronary/Graft Acute MI Revascularization;  Surgeon: Troy Sine, MD;  Location: [facility];  Service: Cardiovascular;  Laterality: N/A;  . LEFT HEART CATH AND CORONARY ANGIOGRAPHY N/A 09/13/2017   Procedure: LEFT HEART CATH AND CORONARY ANGIOGRAPHY;  Surgeon: Troy Sine, MD;  Location: [facility];  Service: Cardiovascular;  Laterality: N/A;  . OTHER SURGICAL HISTORY  2003   "genital warts removed"  . PROSTATECTOMY  2016    Social History   Socioeconomic History  . Marital status: Married    Spouse name: Not on file  . Number of children: 8  . Years of education: Not on file  . Highest education level: Not on file  Occupational History  . Occupation: truck Animator Needs  . Financial resource strain: Not on file  . Food insecurity    Worry: Not on file    Inability: Not on file  . Transportation needs    Medical: Not on file    Non-medical: Not on file  Tobacco Use  . Smoking status: Former Smoker    Packs/day: 0.12    Years: 3.00    Pack years: 0.36    Types: Cigarettes  . Smokeless tobacco: Never Used  . Tobacco comment: 09/15/2017 "nothing in the 200s"  Substance and Sexual Activity  . Alcohol use: Not Currently  . Drug use: Not Currently  . Sexual activity: Not Currently  Lifestyle  . Physical activity    Days per week: Not on file    Minutes per session: Not on file  . Stress: Not on file  Relationships  . Social Herbalist on phone: Not on file    Gets together: Not on file    Attends religious service: Not on file    Active member of club or organization: Not on file    Attends meetings of clubs or organizations: Not on file    Relationship status: Not on file  . Intimate partner violence    Fear of current or ex partner: Not on file    Emotionally abused: Not on file    Physically abused: Not on file    Forced sexual activity: Not on file  Other Topics Concern  . Not  on file  Social History Narrative   He lives with wife and daughter.   He is currently not working since October 2014.  He was working as a Administrator, previously was doing long distance, but now only locally.     Family History  Problem Relation Age of Onset  . Diabetes Mother   . Arthritis Mother   . Heart disease Mother  stents  . Diabetes Sister   . Cancer Sister   . Arthritis Maternal Grandmother     ROS: no fevers or chills, productive cough, hemoptysis, dysphasia, odynophagia, melena, hematochezia, dysuria, hematuria, rash, seizure activity, orthopnea, PND, pedal edema, claudication. Remaining systems are negative.  Physical Exam: Well-developed well-nourished in no acute distress.  Skin is warm and dry.  HEENT is normal.  Neck is supple.  Chest is clear to auscultation with normal expansion.  Cardiovascular exam is regular rate and rhythm.  Abdominal exam nontender or distended. No masses palpated. Extremities show no edema. neuro grossly intact  ECG- personally reviewed  A/P  1 coronary artery disease-patient has not had recurrent chest pain.  Plan to continue aspirin and statin.  Discontinue Brilinta.  2 hypertension-blood pressure is controlled.  Continue present medications.  3 hyperlipidemia-continue statin.  [name], MD

## 2018-12-22 NOTE — Progress Notes (Signed)
[name], [name] ([id number]) Visit Report for 12/21/2018 SuperBill Details Patient Name: Date of Service: [name], [name] 12/21/2018 Medical Record [id number] Patient Account Number: [id number] Date of Birth/Sex: Treating RN: 06-04-1960 (58 y.o. [name] Primary Care Provider: [name] Other Clinician: Referring Provider: Treating Provider/Extender:Jhayla Podgorski, [name], Alyson Locket in Treatment: 8 Diagnosis Coding ICD-10 Codes Code Description N30.41 Irradiation cystitis with hematuria Z85.46 Personal history of malignant neoplasm of prostate Facility Procedures CPT4 Code Description Modifier Quantity [id number] G0277-(Facility Use Only) HBOT, full body chamber, 24min 4 Physician Procedures CPT4 Code Description Modifier Quantity [id number] - WC PHYS HYPERBARIC OXYGEN THERAPY 1 ICD-10 Diagnosis Description N30.41 Irradiation cystitis with hematuria Electronic Signature(s) Signed: 12/21/2018 5:46:17 PM By: Linton Ham MD Signed: 12/22/2018 4:00:22 PM By: Mikeal Hawthorne EMT/HBOT Entered By: Mikeal Hawthorne on 12/21/2018 10:35:41

## 2018-12-22 NOTE — Progress Notes (Signed)
STEPHANE, AUKERMAN ([id number]) Visit Report for 12/22/2018 SuperBill Details Patient Name: Date of Service: MADDISON, STRIEGEL 12/22/2018 Medical Record [id number] Patient Account Number: [id number] Date of Birth/Sex: Treating RN: 1960-10-13 (58 y.o. [name]) Carlene Coria Primary Care Provider: [name] Other Clinician: Referring Provider: Treating Provider/Extender:Robson, [name], Alyson Locket in Treatment: 9 Diagnosis Coding ICD-10 Codes Code Description N30.41 Irradiation cystitis with hematuria Z85.46 Personal history of malignant neoplasm of prostate Facility Procedures CPT4 Code Description Modifier Quantity [id number] G0277-(Facility Use Only) HBOT, full body chamber, 42min 4 Physician Procedures CPT4 Code Description Modifier Quantity [id number] - WC PHYS HYPERBARIC OXYGEN THERAPY 1 ICD-10 Diagnosis Description N30.41 Irradiation cystitis with hematuria Electronic Signature(s) Signed: 12/22/2018 4:00:22 PM By: Mikeal Hawthorne EMT/HBOT Signed: 12/22/2018 6:07:35 PM By: Linton Ham MD Entered By: Mikeal Hawthorne on 12/22/2018 10:46:42

## 2018-12-22 NOTE — Progress Notes (Addendum)
RUNE, WESBY ([id number]) Visit Report for 12/22/2018 HBO Details Patient Name: Date of Service: [name], [name] 12/22/2018 8:00 AM Medical Record [id number] Patient Account Number: [id number] Date of Birth/Sex: Treating RN: [birth year] (58 y.o. [name]) Carlene Coria Primary Care Mardene Lessig: [name] Other Clinician: Referring Jennesis Ramaswamy: Treating Cayli Escajeda/Extender:Robson, [name], Alyson Locket in Treatment: 9 HBO Treatment Course Details Treatment Course Number: 1 Ordering Jovanne Riggenbach: Linton Ham Total Treatments Ordered: 40 HBO Treatment Start Date: 11/16/2018 HBO Indication: Late Effect of Radiation HBO Treatment Details Treatment Number: 21 Patient Type: Outpatient Chamber Type: Monoplace Chamber Serial #: [id number] Treatment Protocol: 2.5 ATA with 90 minutes oxygen, with two 5 minute air breaks Treatment Details Compression Rate Down: 2.0 psi / minute De-Compression Rate Up: 2.0 psi / minute Air breaks and CompressTx Pressure breathing periods DecompressDecompress Begins Reached (leave unused spaces Begins Ends blank) Chamber Pressure (ATA)1 2.5 2.5 2.5 2.5 2.5 --2.5 1 Clock Time (24 hr) 08:10 08:22 [id number] 10:14 Treatment Length: 124 (minutes) Treatment Segments: 4 Vital Signs Capillary Blood Glucose Reference Range: 80 - 120 mg / dl HBO Diabetic Blood Glucose Intervention Range: <131 mg/dl or >249 mg/dl Time Vitals Blood Respiratory Capillary Blood Glucose Pulse Action Type: Pulse: Temperature: Taken: Pressure: Rate: Glucose (mg/dl): Meter #: Oximetry (%) Taken: Pre 08:00 144/74 67 15 97.5 175 Post 10:17 136/67 53 14 98 132 Treatment Response Treatment Toleration: Well Treatment Completion Treatment Completed without Adverse Event Status: [place] Daily Notes No concerns with treatment given Physician HBO Attestation: I certify that I supervised this HBO treatment in accordance with Medicare guidelines. A trained Yes Yes emergency  response team is readily available per hospital policies and procedures. Continue HBOT as ordered. Yes Electronic Signature(s) Signed: 12/22/2018 6:07:35 PM By: Linton Ham MD Previous Signature: 12/22/2018 4:00:22 PM Version By: Mikeal Hawthorne EMT/HBOT Entered By: Linton Ham on 12/22/2018 18:03:19 -------------------------------------------------------------------------------- HBO Safety Checklist Details Patient Name: Date of Service: [name], [name] 12/22/2018 8:00 AM Medical Record [id number] Patient Account Number: [id number] Date of Birth/Sex: Treating RN: 09/20/1960 (58 y.o. [name]) Carlene Coria Primary Care Sahira Cataldi: [name] Other Clinician: Referring Alaster Asfaw: Treating Cy Bresee/Extender:Robson, [name], Alyson Locket in Treatment: 9 HBO Safety Checklist Items Safety Checklist Consent Form Signed Patient voided / foley secured and emptied When did you last eato 0700 - orange juice Last dose of injectable or oral agent metformin NA Ostomy pouch emptied and vented if applicable NA All implantable devices assessed, documented and approved NA Intravenous access site secured and place Valuables secured Linens and cotton and cotton/polyester blend (less than 51% polyester) Personal oil-based products / skin lotions / body lotions removed NA Wigs or hairpieces removed NA Smoking or tobacco materials removed Books / newspapers / magazines / loose paper removed Cologne, aftershave, perfume and deodorant removed Jewelry removed (may wrap wedding band) NA Make-up removed Hair care products removed NA Battery operated devices (external) removed NA Heating patches and chemical warmers removed NA Titanium eyewear removed NA Nail polish cured greater than 10 hours NA Casting material cured greater than 10 hours NA Hearing aids removed Loose dentures or partials removed NA Prosthetics have been removed Patient demonstrates correct use of air break device (if  applicable) Patient concerns have been addressed Patient grounding bracelet on and cord attached to chamber Specifics for Inpatients (complete in addition to above) Medication sheet sent with patient Intravenous medications needed or due during therapy sent with patient Drainage tubes (e.g. nasogastric tube or chest tube secured and vented) Endotracheal or Tracheotomy tube secured Cuff deflated of air and inflated with saline  Airway suctioned Electronic Signature(s) Signed: 12/22/2018 8:22:53 AM By: Mikeal Hawthorne EMT/HBOT Entered By: Mikeal Hawthorne on 12/22/2018 08:22:53

## 2018-12-23 ENCOUNTER — Other Ambulatory Visit: Payer: Self-pay

## 2018-12-23 ENCOUNTER — Encounter (HOSPITAL_BASED_OUTPATIENT_CLINIC_OR_DEPARTMENT_OTHER): Payer: Medicare HMO | Admitting: Physician Assistant

## 2018-12-23 DIAGNOSIS — N3041 Irradiation cystitis with hematuria: Secondary | ICD-10-CM | POA: Diagnosis not present

## 2018-12-23 LAB — GLUCOSE, CAPILLARY
Glucose-Capillary: 172 mg/dL — ABNORMAL HIGH (ref 70–99)
Glucose-Capillary: 111 mg/dL — ABNORMAL HIGH (ref 70–99)

## 2018-12-23 NOTE — Progress Notes (Signed)
[name], [name] ([id number]) Visit Report for 12/23/2018 Arrival Information Details Patient Name: Date of Service: [name], [name] 12/23/2018 8:00 AM Medical Record [id number] Patient Account Number: [id number] Date of Birth/Sex: Treating RN: 1960/02/24 (58 y.o. [name] Primary Care [name]: [name] Other Clinician: Referring [name]: Treating [name]/Extender:[name], [name] Weeks in Treatment: 9 Visit Information History Since Last Visit Added or deleted any medications: No Patient Arrived: Ambulatory Any new allergies or adverse reactions: No Arrival Time: 07:55 Had a fall or experienced change in No Accompanied By: self activities of daily living that may affect Transfer Assistance: None risk of falls: Patient Identification Verified: Yes Signs or symptoms of abuse/neglect since last No Secondary Verification Process Yes visito Completed: Hospitalized since last visit: No Patient Requires Transmission-Based No Implantable device outside of the clinic excluding No Precautions: cellular tissue based products placed in the center Patient Has Alerts: No since last visit: Pain Present Now: No Electronic Signature(s) Signed: 12/23/2018 4:21:10 PM By: [name] EMT/HBOT Entered By: [name] on 12/23/2018 08:51:09 -------------------------------------------------------------------------------- Encounter Discharge Information Details Patient Name: Date of Service: [name] 12/23/2018 8:00 AM Medical Record [id number] Patient Account Number: [id number] Date of Birth/Sex: Treating RN: 1960/03/13 (58 y.o. [name] Primary Care Leyanna Bittman: [name] Other Clinician: Referring Hoa Briggs: Treating [name]/Extender:[name], [name] Weeks in Treatment: 9 Encounter Discharge Information Items Discharge Condition: Stable Ambulatory Status: Ambulatory Discharge Destination: Home Transportation:  Private Auto Accompanied By: self Schedule Follow-up Appointment: Yes Clinical Summary of Care: Patient Declined Electronic Signature(s) Signed: 12/23/2018 4:21:10 PM By: [name] EMT/HBOT Entered By: [name] on 12/23/2018 11:12:28 -------------------------------------------------------------------------------- Patient/Caregiver Education Details Patient Name: [name] 11/18/2020andnbsp8:00 Date of Service: AM Medical Record [id number] Number: Patient Account Number: [id number] Treating RN: April 17, 1960 (58 y.o. [name] Date of Birth/Gender: M) Other Clinician: Primary Care Physician: [name] Treating [name] Referring Physician: Physician/Extender: [name] in Treatment: 9 Education Assessment Education Provided To: Patient Education Topics Provided Hyperbaric Oxygenation: Methods: Explain/Verbal Responses: State content correctly Electronic Signature(s) Signed: 12/23/2018 4:21:10 PM By: [name] EMT/HBOT Entered By: [name] on 12/23/2018 11:12:15 -------------------------------------------------------------------------------- Vitals Details Patient Name: Date of Service: [name] 12/23/2018 8:00 AM Medical Record [id number] Patient Account Number: [id number] Date of Birth/Sex: Treating RN: 1960-10-14 (58 y.o. [name] Primary Care Shaun Runyon: [name] Other Clinician: Referring Rosilyn Coachman: Treating Derec Mozingo/Extender:[name], [name] Weeks in Treatment: 9 Vital Signs Time Taken: 08:00 Temperature (F): 97.4 Height (in): 70 Pulse (bpm): 62 Weight (lbs): 255 Respiratory Rate (breaths/min): 17 Body Mass Index (BMI): 36.6 Blood Pressure (mmHg): 163/96 Capillary Blood Glucose (mg/dl): 172 Reference Range: 80 - 120 mg / dl Electronic Signature(s) Signed: 12/23/2018 4:21:10 PM By: [name] EMT/HBOT Entered By: [name] on 12/23/2018 08:51:27

## 2018-12-23 NOTE — Progress Notes (Signed)
AGAMJOT, MAZZARA ([id number]) Visit Report for 12/23/2018 Problem List Details Patient Name: Date of Service: [name], [name] 12/23/2018 8:00 AM Medical Record [id number] Patient Account Number: [id number] Date of Birth/Sex: Treating RN: 1960-04-12 (58 y.o. [name] Primary Care Provider: [name] Other Clinician: Referring Provider: Treating Provider/Extender:Stone III, [name], [name] Weeks in Treatment: 9 Active Problems ICD-10 Evaluated Encounter Code Description Active Date Today Diagnosis N30.41 Irradiation cystitis with hematuria 10/20/2018 No Yes Z85.46 Personal history of malignant neoplasm of prostate 10/20/2018 No Yes Inactive Problems Resolved Problems Electronic Signature(s) Signed: 12/23/2018 5:11:42 PM By: [name] PA-C Entered By: [name] on 12/23/2018 16:18:45 -------------------------------------------------------------------------------- SuperBill Details Patient Name: Date of Service: [name], [name] 12/23/2018 Medical Record [id number] Patient Account Number: [id number] Date of Birth/Sex: Treating RN: 04-Jul-1960 (58 y.o. 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

## 2018-12-23 NOTE — Progress Notes (Addendum)
[name] ([id number]) Visit Report for 12/23/2018 HBO Details Patient Name: Date of Service: [name], [name] 12/23/2018 8:00 AM Medical Record [id number] Patient Account Number: [id number] Date of Birth/Sex: Treating RN: Jan 26, 1961 (58 y.o. [name] Mention Primary Care [name]: [name] Other Clinician: Referring [name]: Treating [name]/Extender:Stone III, [name] Weeks in Treatment: 9 HBO Treatment Course Details Treatment Course Number: 1 Ordering Mckinsey Keagle: Linton Ham Total Treatments Ordered: 40 HBO Treatment Start Date: 11/16/2018 HBO Indication: Late Effect of Radiation HBO Treatment Details Treatment Number: 22 Patient Type: Outpatient Chamber Type: Monoplace Chamber Serial #: [id number] Treatment Protocol: 2.5 ATA with 90 minutes oxygen, with two 5 minute air breaks Treatment Details Compression Rate Down: 2.0 psi / minute De-Compression Rate Up: 2.0 psi / minute Air breaks and CompressTx Pressure breathing periods DecompressDecompress Begins Reached (leave unused spaces Begins Ends blank) Chamber Pressure (ATA)1 2.5 2.5 2.5 2.5 2.5 --2.5 1 Clock Time (24 hr) 08:24 08:36 [id number] 10:28 Treatment Length: 124 (minutes) Treatment Segments: 4 Vital Signs Capillary Blood Glucose Reference Range: 80 - 120 mg / dl HBO Diabetic Blood Glucose Intervention Range: <131 mg/dl or >249 mg/dl Time Vitals Blood Respiratory Capillary Blood Glucose Pulse Action Type: Pulse: Temperature: Taken: Pressure: Rate: Glucose (mg/dl): Meter #: Oximetry (%) Taken: Pre 08:00 163/96 62 17 97.4 172 Post 10:30 175/92 61 14 97.9 111 Treatment Response Treatment Toleration: Well Treatment Completion Treatment Completed without Adverse Event Status: Physician HBO Attestation: I certify that I supervised this HBO treatment in accordance with Medicare guidelines. A trained Yes emergency response team is readily available per hospital  policies and procedures. Continue HBOT as ordered. Yes Electronic Signature(s) Signed: 12/23/2018 5:11:42 PM By: [name] PA-C Entered By: [name] on 12/23/2018 16:18:40 -------------------------------------------------------------------------------- HBO Safety Checklist Details Patient Name: Date of Service: [name], [name] 12/23/2018 8:00 AM Medical Record [id number] Patient Account Number: [id number] Date of Birth/Sex: Treating RN: 30-Sep-1960 (58 y.o. [name] Mention Primary Care [name]: [name] Other Clinician: Referring [name]: Treating [name]/Extender:Stone III, [name] Weeks in Treatment: 9 HBO Safety Checklist Items Safety Checklist Consent Form Signed Patient voided / foley secured and emptied When did you last eato 0700 - milk Last dose of injectable or oral agent metformin NA Ostomy pouch emptied and vented if applicable NA All implantable devices assessed, documented and approved NA Intravenous access site secured and place Valuables secured Linens and cotton and cotton/polyester blend (less than 51% polyester) Personal oil-based products / skin lotions / body lotions removed NA Wigs or hairpieces removed NA Smoking or tobacco materials removed Books / newspapers / magazines / loose paper removed Cologne, aftershave, perfume and deodorant removed Jewelry removed (may wrap wedding band) NA Make-up removed Hair care products removed NA Battery operated devices (external) removed NA Heating patches and chemical warmers removed NA Titanium eyewear removed NA Nail polish cured greater than 10 hours NA Casting material cured greater than 10 hours NA Hearing aids removed Loose dentures or partials removed NA Prosthetics have been removed Patient demonstrates correct use of air break device (if applicable) Patient concerns have been addressed Patient grounding bracelet on and cord attached to chamber Specifics for  Inpatients (complete in addition to above) Medication sheet sent with patient Intravenous medications needed or due during therapy sent with patient Drainage tubes (e.g. nasogastric tube or chest tube secured and Drainage tubes (e.g. nasogastric tube or chest tube secured and vented) Endotracheal or Tracheotomy tube secured Cuff deflated of air and inflated with saline Airway suctioned Electronic Signature(s) Signed:  12/23/2018 8:52:33 AM By: Mikeal Hawthorne EMT/HBOT Entered By: Mikeal Hawthorne on 12/23/2018 08:52:33

## 2018-12-24 ENCOUNTER — Encounter (HOSPITAL_BASED_OUTPATIENT_CLINIC_OR_DEPARTMENT_OTHER): Payer: Medicare HMO | Admitting: Internal Medicine

## 2018-12-24 ENCOUNTER — Other Ambulatory Visit: Payer: Self-pay

## 2018-12-24 DIAGNOSIS — N3041 Irradiation cystitis with hematuria: Secondary | ICD-10-CM | POA: Diagnosis not present

## 2018-12-24 LAB — GLUCOSE, CAPILLARY
Glucose-Capillary: 155 mg/dL — ABNORMAL HIGH (ref 70–99)
Glucose-Capillary: 112 mg/dL — ABNORMAL HIGH (ref 70–99)

## 2018-12-24 NOTE — Progress Notes (Addendum)
QUASIM, BELARDE ([id number]) Visit Report for 12/24/2018 HBO Details Patient Name: Date of Service: MALIK, PRASSE 12/24/2018 8:00 AM Medical Record [id number] Patient Account Number: [id number] Date of Birth/Sex: Treating RN: Feb 01, 1961 (58 y.o. [name] Primary Care Lizette Pazos: [name] Other Clinician: Referring Alekxander Isola: Treating Ximena Todaro/Extender:Robson, [name], Alyson Locket in Treatment: 9 HBO Treatment Course Details Treatment Course Number: 1 Ordering Lamarco Gudiel: Linton Ham Total Treatments Ordered: 40 HBO Treatment Start Date: 11/16/2018 HBO Indication: Late Effect of Radiation HBO Treatment Details Treatment Number: 23 Patient Type: Outpatient Chamber Type: Monoplace Chamber Serial #: [id number] Treatment Protocol: 2.5 ATA with 90 minutes oxygen, with two 5 minute air breaks Treatment Details Compression Rate Down: 2.0 psi / minute De-Compression Rate Up: 2.0 psi / minute Air breaks and CompressTx Pressure breathing periods DecompressDecompress Begins Reached (leave unused spaces Begins Ends blank) Chamber Pressure (ATA)1 2.5 2.5 2.5 2.5 2.5 --2.5 1 Clock Time (24 hr) 08:15 08:27 [id number] 10:19 Treatment Length: 124 (minutes) Treatment Segments: 4 Vital Signs Capillary Blood Glucose Reference Range: 80 - 120 mg / dl HBO Diabetic Blood Glucose Intervention Range: <131 mg/dl or >249 mg/dl Time Vitals Blood Respiratory Capillary Blood Glucose Pulse Action Type: Pulse: Temperature: Taken: Pressure: Rate: Glucose (mg/dl): Meter #: Oximetry (%) Taken: Pre 08:05 162/81 60 15 97.9 155 Post 10:21 156/99 60 14 98 112 Treatment Response Treatment Toleration: Well Treatment Completion Treatment Completed without Adverse Event Status: Cono Gebhard Notes No concerns with treatment given Physician HBO Attestation: I certify that I supervised this HBO treatment in accordance with Medicare guidelines. A trained Yes Yes emergency  response team is readily available per hospital policies and procedures. Continue HBOT as ordered. Yes Electronic Signature(s) Signed: 12/24/2018 6:25:25 PM By: Linton Ham MD Entered By: Linton Ham on 12/24/2018 18:19:17 -------------------------------------------------------------------------------- HBO Safety Checklist Details Patient Name: Date of Service: BRESHAWN, COLONA 12/24/2018 8:00 AM Medical Record [id number] Patient Account Number: [id number] Date of Birth/Sex: Treating RN: November 08, 1960 (58 y.o. Lorette Ang, Tammi Klippel Primary Care Timya Trimmer: [name] Other Clinician: Referring Nahun Kronberg: Treating Zhion Pevehouse/Extender:Robson, [name], Alyson Locket in Treatment: 9 HBO Safety Checklist Items Safety Checklist Consent Form Signed Patient voided / foley secured and emptied When did you last eato 0700 - milk Last dose of injectable or oral agent metformin NA Ostomy pouch emptied and vented if applicable NA All implantable devices assessed, documented and approved NA Intravenous access site secured and place Valuables secured Linens and cotton and cotton/polyester blend (less than 51% polyester) Personal oil-based products / skin lotions / body lotions removed NA Wigs or hairpieces removed NA Smoking or tobacco materials removed Books / newspapers / magazines / loose paper removed Cologne, aftershave, perfume and deodorant removed Jewelry removed (may wrap wedding band) NA Make-up removed Hair care products removed NA Battery operated devices (external) removed NA Heating patches and chemical warmers removed NA Titanium eyewear removed NA Nail polish cured greater than 10 hours NA Casting material cured greater than 10 hours NA Hearing aids removed Loose dentures or partials removed NA Prosthetics have been removed Patient demonstrates correct use of air break device (if applicable) Patient concerns have been addressed Patient grounding bracelet on and  cord attached to chamber Specifics for Inpatients (complete in addition to above) Medication sheet sent with patient Intravenous medications needed or due during therapy sent with patient Drainage tubes (e.g. nasogastric tube or chest tube secured and vented) Endotracheal or Tracheotomy tube secured Cuff deflated of air and inflated with saline Airway suctioned Electronic Signature(s) Signed: 12/24/2018 8:38:34 AM By: Mikeal Hawthorne  EMT/HBOT Entered By: Mikeal Hawthorne on 12/24/2018 08:38:34

## 2018-12-25 ENCOUNTER — Other Ambulatory Visit: Payer: Self-pay

## 2018-12-25 ENCOUNTER — Encounter (HOSPITAL_BASED_OUTPATIENT_CLINIC_OR_DEPARTMENT_OTHER): Payer: Medicare HMO | Admitting: Internal Medicine

## 2018-12-25 DIAGNOSIS — N3041 Irradiation cystitis with hematuria: Secondary | ICD-10-CM | POA: Diagnosis not present

## 2018-12-25 LAB — GLUCOSE, CAPILLARY
Glucose-Capillary: 156 mg/dL — ABNORMAL HIGH (ref 70–99)
Glucose-Capillary: 118 mg/dL — ABNORMAL HIGH (ref 70–99)

## 2018-12-25 NOTE — Progress Notes (Signed)
[name], [name] ([id number]) Visit Report for 12/24/2018 Arrival Information Details Patient Name: Date of Service: [name], [name] 12/24/2018 8:00 AM Medical Record [id number] Patient Account Number: [id number] Date of Birth/Sex: Treating RN: 04-26-1960 (58 y.o. Lorette Ang, Tammi Klippel Primary Care Jr Milliron: [name] Other Clinician: Referring Lanier Felty: Treating Jovie Swanner/Extender:Robson, [name], Alyson Locket in Treatment: 9 Visit Information History Since Last Visit Added or deleted any medications: No Patient Arrived: Ambulatory Any new allergies or adverse reactions: No Arrival Time: 08:00 Had a fall or experienced change in No Accompanied By: self activities of daily living that may affect Transfer Assistance: None risk of falls: Patient Identification Verified: Yes Signs or symptoms of abuse/neglect since last No Secondary Verification Process Yes visito Completed: Hospitalized since last visit: No Patient Requires Transmission-Based No Implantable device outside of the clinic excluding No Precautions: cellular tissue based products placed in the center Patient Has Alerts: No since last visit: Pain Present Now: No Electronic Signature(s) Signed: 12/25/2018 4:45:58 PM By: Mikeal Hawthorne EMT/HBOT Entered By: Mikeal Hawthorne on 12/24/2018 08:37:38 -------------------------------------------------------------------------------- Encounter Discharge Information Details Patient Name: Date of Service: [name], [name] 12/24/2018 8:00 AM Medical Record [id number] Patient Account Number: [id number] Date of Birth/Sex: Treating RN: 05/15/1960 (58 y.o. [name] Primary Care Lacresia Darwish: [name] Other Clinician: Referring Zuriel Roskos: Treating Chelsei Mcchesney/Extender:Robson, [name], Alyson Locket in Treatment: 9 Encounter Discharge Information Items Discharge Condition: Stable Ambulatory Status: Ambulatory Discharge Destination: Home Transportation:  Private Auto Accompanied By: self Schedule Follow-up Appointment: Yes Clinical Summary of Care: Patient Declined Electronic Signature(s) Signed: 12/25/2018 4:45:58 PM By: Mikeal Hawthorne EMT/HBOT Entered By: Mikeal Hawthorne on 12/24/2018 10:51:21 -------------------------------------------------------------------------------- Patient/Caregiver Education Details Patient Name: [name], [name] 11/19/2020andnbsp8:00 Date of Service: AM Medical Record [id number] Number: Patient Account Number: [id number] Treating RN: 1960/06/10 (58 y.o. [name] Date of Birth/Gender: M) Other Clinician: Primary Care Physician: [name] Treating Linton Ham Referring Physician: Physician/Extender: Donnajean Lopes in Treatment: 9 Education Assessment Education Provided To: Patient Education Topics Provided Hyperbaric Oxygenation: Methods: Explain/Verbal Responses: State content correctly Electronic Signature(s) Signed: 12/25/2018 4:45:58 PM By: Mikeal Hawthorne EMT/HBOT Entered By: Mikeal Hawthorne on 12/24/2018 10:51:10 -------------------------------------------------------------------------------- Vitals Details Patient Name: Date of Service: [name], [name] 12/24/2018 8:00 AM Medical Record [id number] Patient Account Number: [id number] Date of Birth/Sex: Treating RN: August 26, 1960 (58 y.o. [name] Primary Care Jaquille Kau: [name] Other Clinician: Referring Tashe Purdon: Treating Jadarian Mckay/Extender:Robson, [name], Alyson Locket in Treatment: 9 Vital Signs Time Taken: 08:05 Temperature (F): 97.9 Height (in): 70 Pulse (bpm): 60 Weight (lbs): 255 Respiratory Rate (breaths/min): 15 Body Mass Index (BMI): 36.6 Blood Pressure (mmHg): 162/81 Capillary Blood Glucose (mg/dl): 155 Reference Range: 80 - 120 mg / dl Electronic Signature(s) Signed: 12/25/2018 4:45:58 PM By: Mikeal Hawthorne EMT/HBOT Entered By: Mikeal Hawthorne on 12/24/2018 08:37:51

## 2018-12-25 NOTE — Progress Notes (Signed)
DEVESH, BERTAGNOLLI ([id number]) Visit Report for 12/25/2018 SuperBill Details Patient Name: Date of Service: [name], [name] 12/25/2018 Medical Record [id number] Patient Account Number: [id number] Date of Birth/Sex: Treating RN: [birth year] (58 y.o. M) Primary Care Provider: [name] Other Clinician: Referring Provider: Treating Provider/Extender:Robson, [name], Alyson Locket in Treatment: 9 Diagnosis Coding ICD-10 Codes Code Description N30.41 Irradiation cystitis with hematuria Z85.46 Personal history of malignant neoplasm of prostate Facility Procedures CPT4 Code Description Modifier Quantity [id number] G0277-(Facility Use Only) HBOT, full body chamber, 50min 4 Physician Procedures CPT4 Code Description Modifier Quantity [id number] - WC PHYS HYPERBARIC OXYGEN THERAPY 1 ICD-10 Diagnosis Description N30.41 Irradiation cystitis with hematuria Electronic Signature(s) Signed: 12/25/2018 4:45:58 PM By: Mikeal Hawthorne EMT/HBOT Signed: 12/25/2018 6:05:38 PM By: Linton Ham MD Entered By: Mikeal Hawthorne on 12/25/2018 10:56:37

## 2018-12-25 NOTE — Progress Notes (Signed)
CHIA, WHEELEY ([id number]) Visit Report for 12/25/2018 Arrival Information Details Patient Name: Date of Service: [name], [name] 12/25/2018 8:00 AM Medical Record [id number] Patient Account Number: [id number] Date of Birth/Sex: Treating RN: 09/18/1960 (58 y.o. M) Primary Care Emmelia Holdsworth: [name] Other Clinician: Referring Wylee Dorantes: Treating Valissa Lyvers/Extender:Robson, [name], Alyson Locket in Treatment: 9 Visit Information History Since Last Visit Added or deleted any medications: No Patient Arrived: Ambulatory Any new allergies or adverse reactions: No Arrival Time: 08:00 Had a fall or experienced change in No Accompanied By: self activities of daily living that may affect Transfer Assistance: None risk of falls: Patient Identification Verified: Yes Signs or symptoms of abuse/neglect since last No Secondary Verification Process Yes visito Completed: Hospitalized since last visit: No Patient Requires Transmission-Based No Implantable device outside of the clinic excluding No Precautions: cellular tissue based products placed in the center Patient Has Alerts: No since last visit: Pain Present Now: No Electronic Signature(s) Signed: 12/25/2018 4:45:58 PM By: Mikeal Hawthorne EMT/HBOT Entered By: Mikeal Hawthorne on 12/25/2018 08:33:17 -------------------------------------------------------------------------------- Encounter Discharge Information Details Patient Name: Date of Service: [name], [name] 12/25/2018 8:00 AM Medical Record [id number] Patient Account Number: [id number] Date of Birth/Sex: Treating RN: 12-21-1960 (58 y.o. M) Primary Care Adelaida Reindel: [name] Other Clinician: Referring Angla Delahunt: Treating Makayia Duplessis/Extender:Robson, [name], Alyson Locket in Treatment: 9 Encounter Discharge Information Items Discharge Condition: Stable Ambulatory Status: Ambulatory Discharge Destination: Home Transportation: Private Auto Accompanied By:  self Schedule Follow-up Appointment: Yes Clinical Summary of Care: Patient Declined Electronic Signature(s) Signed: 12/25/2018 4:45:58 PM By: Mikeal Hawthorne EMT/HBOT Entered By: Mikeal Hawthorne on 12/25/2018 10:56:56 -------------------------------------------------------------------------------- Patient/Caregiver Education Details Patient Name: [name], [name] 11/20/2020andnbsp8:00 Date of Service: AM Medical Record [id number] Number: Patient Account Number: [id number] Treating RN: [birth year] (58 y.o. Date of Birth/Gender: M) Other Clinician: Primary Care Physician: [name] Treating Linton Ham Referring Physician: Physician/Extender: Donnajean Lopes in Treatment: 9 Education Assessment Education Provided To: Patient Education Topics Provided Hyperbaric Oxygenation: Methods: Explain/Verbal Responses: State content correctly Electronic Signature(s) Signed: 12/25/2018 4:45:58 PM By: Mikeal Hawthorne EMT/HBOT Entered By: Mikeal Hawthorne on 12/25/2018 10:56:46 -------------------------------------------------------------------------------- Vitals Details Patient Name: Date of Service: [name], [name] 12/25/2018 8:00 AM Medical Record [id number] Patient Account Number: [id number] Date of Birth/Sex: Treating RN: 28-Sep-1960 (58 y.o. M) Primary Care Dillyn Joaquin: [name] Other Clinician: Referring Paulyne Mooty: Treating Julisa Flippo/Extender:Robson, [name], Alyson Locket in Treatment: 9 Vital Signs Time Taken: 08:05 Temperature (F): 98.2 Height (in): 70 Pulse (bpm): 60 Weight (lbs): 255 Respiratory Rate (breaths/min): 16 Body Mass Index (BMI): 36.6 Blood Pressure (mmHg): 155/77 Capillary Blood Glucose (mg/dl): 156 Reference Range: 80 - 120 mg / dl Electronic Signature(s) Signed: 12/25/2018 4:45:58 PM By: Mikeal Hawthorne EMT/HBOT Entered By: Mikeal Hawthorne on 12/25/2018 08:33:29

## 2018-12-25 NOTE — Progress Notes (Signed)
JVON, TRUSSEL ([id number]) Visit Report for 12/24/2018 SuperBill Details Patient Name: Date of Service: [name], [name] 12/24/2018 Medical Record [id number] Patient Account Number: [id number] Date of Birth/Sex: Treating RN: 1960/03/10 (58 y.o. [name] Primary Care Provider: [name] Other Clinician: Referring Provider: Treating Provider/Extender:Dainelle Hun, [name], Alyson Locket in Treatment: 9 Diagnosis Coding ICD-10 Codes Code Description N30.41 Irradiation cystitis with hematuria Z85.46 Personal history of malignant neoplasm of prostate Facility Procedures CPT4 Code Description Modifier Quantity [id number] G0277-(Facility Use Only) HBOT, full body chamber, 85min 4 Physician Procedures CPT4 Code Description Modifier Quantity [id number] - WC PHYS HYPERBARIC OXYGEN THERAPY 1 ICD-10 Diagnosis Description N30.41 Irradiation cystitis with hematuria Electronic Signature(s) Signed: 12/24/2018 6:25:25 PM By: Linton Ham MD Signed: 12/25/2018 4:45:58 PM By: Mikeal Hawthorne EMT/HBOT Entered By: Mikeal Hawthorne on 12/24/2018 10:50:55

## 2018-12-25 NOTE — Progress Notes (Addendum)
YANNI, BORRELL ([id number]) Visit Report for 12/25/2018 HBO Details Patient Name: Date of Service: [name], [name] 12/25/2018 8:00 AM Medical Record [id number] Patient Account Number: [id number] Date of Birth/Sex: Treating RN: 1960-06-06 (58 y.o. M) Primary Care Russell Engelstad: [name] Other Clinician: Referring Terrill Wauters: Treating Braylee Bosher/Extender:Robson, [name], Alyson Locket in Treatment: 9 HBO Treatment Course Details Treatment Course Number: 1 Ordering Sophiea Ueda: Linton Ham Total Treatments Ordered: 40 HBO Treatment Start Date: 11/16/2018 HBO Indication: Late Effect of Radiation HBO Treatment Details Treatment Number: 24 Patient Type: Outpatient Chamber Type: Monoplace Chamber Serial #: [id number] Treatment Protocol: 2.5 ATA with 90 minutes oxygen, with two 5 minute air breaks Treatment Details Compression Rate Down: 2.0 psi / minute De-Compression Rate Up: 2.0 psi / minute Air breaks and CompressTx Pressure breathing periods DecompressDecompress Begins Reached (leave unused spaces Begins Ends blank) Chamber Pressure (ATA)1 2.5 2.5 2.5 2.5 2.5 --2.5 1 Clock Time (24 hr) 08:16 08:28 [id number] 10:20 Treatment Length: 124 (minutes) Treatment Segments: 4 Vital Signs Capillary Blood Glucose Reference Range: 80 - 120 mg / dl HBO Diabetic Blood Glucose Intervention Range: <131 mg/dl or >249 mg/dl Time Vitals Blood Respiratory Capillary Blood Glucose Pulse Action Type: Pulse: Temperature: Taken: Pressure: Rate: Glucose (mg/dl): Meter #: Oximetry (%) Taken: Pre 08:05 155/77 60 16 98.2 156 Post 10:22 153/98 60 15 98 118 Treatment Response Treatment Toleration: Well Treatment Completion Treatment Completed without Adverse Event Status: Diamone Whistler Notes No concerns with treatment given Physician HBO Attestation: I certify that I supervised this HBO treatment in accordance with Medicare guidelines. A trained Yes Yes emergency response team  is readily available per hospital policies and procedures. Continue HBOT as ordered. Yes Electronic Signature(s) Signed: 12/25/2018 6:05:38 PM By: Linton Ham MD Previous Signature: 12/25/2018 4:45:58 PM Version By: Mikeal Hawthorne EMT/HBOT Entered By: Linton Ham on 12/25/2018 17:57:48 -------------------------------------------------------------------------------- HBO Safety Checklist Details Patient Name: Date of Service: [name], [name] 12/25/2018 8:00 AM Medical Record [id number] Patient Account Number: [id number] Date of Birth/Sex: Treating RN: 08/16/1960 (58 y.o. M) Primary Care Lenyx Boody: [name] Other Clinician: Referring Iva Montelongo: Treating Blaire Hodsdon/Extender:Robson, [name], Alyson Locket in Treatment: 9 HBO Safety Checklist Items Safety Checklist Consent Form Signed Patient voided / foley secured and emptied When did you last eato 0700 - egg sammy Last dose of injectable or oral agent metformin NA Ostomy pouch emptied and vented if applicable NA All implantable devices assessed, documented and approved NA Intravenous access site secured and place Valuables secured Linens and cotton and cotton/polyester blend (less than 51% polyester) Personal oil-based products / skin lotions / body lotions removed NA Wigs or hairpieces removed NA Smoking or tobacco materials removed Books / newspapers / magazines / loose paper removed Cologne, aftershave, perfume and deodorant removed Jewelry removed (may wrap wedding band) NA Make-up removed Hair care products removed NA Battery operated devices (external) removed NA Heating patches and chemical warmers removed NA Titanium eyewear removed NA Nail polish cured greater than 10 hours NA Casting material cured greater than 10 hours NA Hearing aids removed Loose dentures or partials removed NA Prosthetics have been removed Patient demonstrates correct use of air break device (if applicable) Patient concerns  have been addressed Patient grounding bracelet on and cord attached to chamber Specifics for Inpatients (complete in addition to above) Medication sheet sent with patient Intravenous medications needed or due during therapy sent with patient Drainage tubes (e.g. nasogastric tube or chest tube secured and vented) Endotracheal or Tracheotomy tube secured Cuff deflated of air and inflated with saline Airway suctioned Electronic Signature(s)  Signed: 12/25/2018 8:35:41 AM By: Mikeal Hawthorne EMT/HBOT Entered By: Mikeal Hawthorne on 12/25/2018 08:35:40

## 2018-12-28 ENCOUNTER — Encounter (HOSPITAL_BASED_OUTPATIENT_CLINIC_OR_DEPARTMENT_OTHER): Payer: Medicare HMO | Admitting: Physician Assistant

## 2018-12-28 ENCOUNTER — Other Ambulatory Visit: Payer: Self-pay

## 2018-12-28 DIAGNOSIS — N3041 Irradiation cystitis with hematuria: Secondary | ICD-10-CM | POA: Diagnosis not present

## 2018-12-28 LAB — GLUCOSE, CAPILLARY
Glucose-Capillary: 147 mg/dL — ABNORMAL HIGH (ref 70–99)
Glucose-Capillary: 115 mg/dL — ABNORMAL HIGH (ref 70–99)

## 2018-12-28 NOTE — Progress Notes (Signed)
[name], [name] ([id number]) Visit Report for 12/28/2018 Arrival Information Details Patient Name: Date of Service: [name], [name] 12/28/2018 7:00 AM Medical Record [id number] Patient Account Number: [id number] Date of Birth/Sex: Treating RN: 08-Apr-1960 (58 y.o. [name] Primary Care Pasty Manninen: [name] Other Clinician: Referring Dahlila Pfahler: Treating Danijela Vessey/Extender:Stone III, [name], [name] Weeks in Treatment: 9 Visit Information History Since Last Visit Added or deleted any medications: No Patient Arrived: Ambulatory Any new allergies or adverse reactions: No Arrival Time: 06:50 Had a fall or experienced change in No Accompanied By: self activities of daily living that may affect Transfer Assistance: None risk of falls: Patient Identification Verified: Yes Signs or symptoms of abuse/neglect since last No Secondary Verification Process Yes visito Completed: Hospitalized since last visit: No Patient Requires Transmission-Based No Implantable device outside of the clinic excluding No Precautions: cellular tissue based products placed in the center Patient Has Alerts: No since last visit: Pain Present Now: No Electronic Signature(s) Signed: 12/28/2018 2:18:02 PM By: Deon Pilling Entered By: Deon Pilling on 12/28/2018 07:40:14 -------------------------------------------------------------------------------- Encounter Discharge Information Details Patient Name: Date of Service: [name], [name] 12/28/2018 7:00 AM Medical Record [id number] Patient Account Number: [id number] Date of Birth/Sex: Treating RN: 01/14/1961 (58 y.o. [name] Primary Care Kenza Munar: [name] Other Clinician: Referring Kyonna Frier: Treating Kem Hensen/Extender:Stone III, [name], [name] Weeks in Treatment: 9 Encounter Discharge Information Items Discharge Condition: Stable Ambulatory Status: Ambulatory Discharge Destination: Home Transportation: Private  Auto Accompanied By: self Schedule Follow-up Appointment: Yes Clinical Summary of Care: Electronic Signature(s) Signed: 12/28/2018 2:18:02 PM By: Deon Pilling Entered By: Deon Pilling on 12/28/2018 09:40:48 -------------------------------------------------------------------------------- Patient/Caregiver Education Details Patient Name: [name] 11/23/2020andnbsp7:00 Date of Service: AM Medical Record [id number] Number: Patient Account Number: [id number] Treating RN: [birth year] (58 y.o. Deon Pilling Date of Birth/Gender: M) Other Clinician: Primary Care Physician: [name] Treating [name] Referring Physician: Physician/Extender: Donnajean Lopes in Treatment: 9 Education Assessment Education Provided To: Patient Education Topics Provided Hyperbaric Oxygenation: Handouts: Hyperbaric Oxygen Methods: Explain/Verbal Responses: Reinforcements needed Electronic Signature(s) Signed: 12/28/2018 2:18:02 PM By: Deon Pilling Entered By: Deon Pilling on 12/28/2018 09:40:30 -------------------------------------------------------------------------------- Vitals Details Patient Name: Date of Service: [name], [name] 12/28/2018 7:00 AM Medical Record [id number] Patient Account Number: [id number] Date of Birth/Sex: Treating RN: Nov 09, 1960 (58 y.o. [name] Primary Care Tallin Hart: [name] Other Clinician: Referring Taijuan Serviss: Treating Emilio Baylock/Extender:Stone III, [name], [name] Weeks in Treatment: 9 Vital Signs Time Taken: 06:58 Temperature (F): 99.2 Height (in): 70 Pulse (bpm): 63 Weight (lbs): 255 Respiratory Rate (breaths/min): 16 Body Mass Index (BMI): 36.6 Blood Pressure (mmHg): 127/90 Capillary Blood Glucose (mg/dl): 147 Reference Range: 80 - 120 mg / dl Electronic Signature(s) Signed: 12/28/2018 2:18:02 PM By: Deon Pilling Entered By: Deon Pilling on 12/28/2018 07:40:42

## 2018-12-29 ENCOUNTER — Encounter (HOSPITAL_BASED_OUTPATIENT_CLINIC_OR_DEPARTMENT_OTHER): Payer: Medicare HMO | Admitting: Physician Assistant

## 2018-12-29 ENCOUNTER — Other Ambulatory Visit: Payer: Self-pay

## 2018-12-29 DIAGNOSIS — N3041 Irradiation cystitis with hematuria: Secondary | ICD-10-CM | POA: Diagnosis not present

## 2018-12-29 LAB — GLUCOSE, CAPILLARY
Glucose-Capillary: 215 mg/dL — ABNORMAL HIGH (ref 70–99)
Glucose-Capillary: 124 mg/dL — ABNORMAL HIGH (ref 70–99)

## 2018-12-29 NOTE — Progress Notes (Signed)
BEORN, PORTMAN ([id number]) Visit Report for 12/29/2018 Problem List Details Patient Name: Date of Service: [name], [name] 12/29/2018 7:00 AM Medical Record [id number] Patient Account Number: [id number] Date of Birth/Sex: Treating RN: May 28, 1960 (58 y.o. M) Primary Care Provider: [name] Other Clinician: Referring Provider: Treating Provider/Extender:Stone III, [name], [name] Weeks in Treatment: 10 Active Problems ICD-10 Evaluated Encounter Code Description Active Date Today Diagnosis N30.41 Irradiation cystitis with hematuria 10/20/2018 No Yes Z85.46 Personal history of malignant neoplasm of prostate 10/20/2018 No Yes Inactive Problems Resolved Problems Electronic Signature(s) Signed: 12/29/2018 10:29:27 PM By: [name] PA-C Entered By: [name] on 12/29/2018 22:26:19 -------------------------------------------------------------------------------- SuperBill Details Patient Name: Date of Service: [name], [name] 12/29/2018 Medical Record [id number] Patient Account Number: [id number] Date of Birth/Sex: Treating RN: 05/27/1960 (58 y.o. [name] Primary Care Provider: [name] Other Clinician: Referring Provider: Treating Provider/Extender:Stone III, [name], [name] Weeks in Treatment: 10 Diagnosis Coding ICD-10 Codes Code Description N30.41 Irradiation cystitis with hematuria Z85.46 Personal history of malignant neoplasm of prostate Facility Procedures CPT4 Code Description: [id number] G0277-(Facility Use Only) HBOT, full body chamber, 74min Modifier: Quantity: 4 Physician Procedures CPT4 Code Description: [id number] - WC PHYS HYPERBARIC OXYGEN THERAPY ICD-10 Diagnosis Description N30.41 Irradiation cystitis with hematuria Modifier: Quantity: 1 Electronic Signature(s) Signed: 12/29/2018 10:29:27 PM By: [name] PA-C Entered By: [name] on 12/29/2018 22:26:15

## 2018-12-30 ENCOUNTER — Encounter (HOSPITAL_BASED_OUTPATIENT_CLINIC_OR_DEPARTMENT_OTHER): Payer: Medicare HMO | Admitting: Physician Assistant

## 2019-01-01 ENCOUNTER — Encounter (HOSPITAL_BASED_OUTPATIENT_CLINIC_OR_DEPARTMENT_OTHER): Payer: Medicare HMO | Admitting: Internal Medicine

## 2019-01-04 ENCOUNTER — Encounter (HOSPITAL_BASED_OUTPATIENT_CLINIC_OR_DEPARTMENT_OTHER): Payer: Medicare HMO | Admitting: Internal Medicine

## 2019-01-04 ENCOUNTER — Ambulatory Visit: Payer: Medicare HMO | Admitting: Cardiology

## 2019-01-04 NOTE — Progress Notes (Signed)
ADRION, STICKROD ([id number]) Visit Report for 12/29/2018 HBO Details Patient Name: Date of Service: [name], [name] 12/29/2018 7:00 AM Medical Record [id number] Patient Account Number: [id number] Date of Birth/Sex: Treating RN: [birth year] (58 y.o. [name] Primary Care Amedee Cerrone: [name] Other Clinician: Referring Eino Whitner: Treating Andrina Locken/Extender:Stone III, [name], [name] Weeks in Treatment: 10 HBO Treatment Course Details Treatment Course Number: 1 Ordering Londan Coplen: Linton Ham Total Treatments Ordered: 40 HBO Treatment Start Date: 11/16/2018 HBO Indication: Late Effect of Radiation HBO Treatment Details Treatment Number: 26 Patient Type: Outpatient Chamber Type: Monoplace Chamber Serial #: [id number] Treatment Protocol: 2.5 ATA with 90 minutes oxygen, with two 5 minute air breaks Treatment Details Compression Rate Down: 2.0 psi / minute De-Compression Rate Up: 3.0 psi / minute Air breaks and CompressTx Pressure breathing periods DecompressDecompress Begins Reached (leave unused spaces Begins Ends blank) Chamber Pressure (ATA)1 2.5 2.5 2.5 2.5 2.5 --2.5 1 Clock Time (24 hr) 07:07 07:19 [id number] 09:10 Treatment Length: 123 (minutes) Treatment Segments: 4 Vital Signs Capillary Blood Glucose Reference Range: 80 - 120 mg / dl HBO Diabetic Blood Glucose Intervention Range: <131 mg/dl or >249 mg/dl Time Vitals Blood Respiratory Capillary Blood Glucose Pulse Action Type: Pulse: Temperature: Taken: Pressure: Rate: Glucose (mg/dl): Meter #: Oximetry (%) Taken: Pre 06:54 117/64 66 18 98.5 215 Post 09:12 140/92 58 16 98.2 124 Treatment Response Treatment Toleration: Well Treatment Completion Treatment Completed without Adverse Event Status: Physician HBO Attestation: I certify that I supervised this HBO treatment in accordance with Medicare guidelines. A trained Yes emergency response team is readily available per hospital  policies and procedures. Continue HBOT as ordered. Yes Electronic Signature(s) Signed: 12/29/2018 10:29:27 PM By: [name] PA-C Entered By: [name] on 12/29/2018 22:26:11 -------------------------------------------------------------------------------- HBO Safety Checklist Details Patient Name: Date of Service: [name], [name] 12/29/2018 7:00 AM Medical Record [id number] Patient Account Number: [id number] Date of Birth/Sex: Treating RN: 30-May-1960 (58 y.o. [name] Primary Care Kamel Haven: [name] Other Clinician: Referring Vanessia Bokhari: Treating Teofil Maniaci/Extender:Stone III, [name], [name] Weeks in Treatment: 10 HBO Safety Checklist Items Safety Checklist Consent Form Signed Patient voided / foley secured and emptied When did you last eato 0630 Last dose of injectable or oral agent NA Ostomy pouch emptied and vented if applicable NA All implantable devices assessed, documented and approved NA Intravenous access site secured and place Valuables secured Linens and cotton and cotton/polyester blend (less than 51% polyester) Personal oil-based products / skin lotions / body lotions removed NA Wigs or hairpieces removed NA Smoking or tobacco materials removed Books / newspapers / magazines / loose paper removed Cologne, aftershave, perfume and deodorant removed Jewelry removed (may wrap wedding band) NA Make-up removed NA Hair care products removed NA Battery operated devices (external) removed NA Heating patches and chemical warmers removed NA Titanium eyewear removed NA Nail polish cured greater than 10 hours NA Casting material cured greater than 10 hours NA Hearing aids removed Loose dentures or partials removed NA Prosthetics have been removed Patient demonstrates correct use of air break device (if applicable) Patient concerns have been addressed Patient grounding bracelet on and cord attached to chamber Specifics for Inpatients (complete  in addition to above) NA Medication sheet sent with patient NA Intravenous medications needed or due during therapy sent with patient Drainage tubes (e.g. nasogastric tube or chest tube secured and NA Drainage tubes (e.g. nasogastric tube or chest tube secured and vented) NA Endotracheal or Tracheotomy tube secured NA Cuff deflated of air and inflated with saline NA Airway  suctioned Electronic Signature(s) Signed: 01/04/2019 6:27:46 PM By: [name] RN, BSN Entered By: [name] on 12/29/2018 09:01:09

## 2019-01-04 NOTE — Progress Notes (Signed)
LATREL, LUONGO ([id number]) Visit Report for 12/29/2018 Arrival Information Details Patient Name: Date of Service: SEGER, GOUKER 12/29/2018 7:00 AM Medical Record [id number] Patient Account Number: [id number] Date of Birth/Sex: Treating RN: 09-Jul-1960 (58 y.o. [name] Primary Care Lyric Rossano: [name] Other Clinician: Referring Nidia Grogan: Treating Pierce Barocio/Extender:Stone III, [name], [name] Weeks in Treatment: 10 Visit Information History Since Last Visit Added or deleted any medications: No Patient Arrived: Ambulatory Any new allergies or adverse reactions: No Arrival Time: 06:50 Had a fall or experienced change in No Accompanied By: alone activities of daily living that may affect Transfer Assistance: None risk of falls: Patient Identification Verified: Yes Signs or symptoms of abuse/neglect since last No Secondary Verification Process Yes visito Completed: Hospitalized since last visit: No Patient Requires Transmission-Based No Implantable device outside of the clinic excluding No Precautions: cellular tissue based products placed in the center Patient Has Alerts: No since last visit: Pain Present Now: No Electronic Signature(s) Signed: 01/04/2019 6:27:46 PM By: [name] RN, BSN Entered By: [name] on 12/29/2018 07:58:55 -------------------------------------------------------------------------------- Encounter Discharge Information Details Patient Name: Date of Service: KORBY, PELUSO 12/29/2018 7:00 AM Medical Record [id number] Patient Account Number: [id number] Date of Birth/Sex: Treating RN: May 04, 1960 (58 y.o. [name] Primary Care Adlynn Lowenstein: [name] Other Clinician: Referring Tamira Ryland: Treating Nirav Sweda/Extender:Stone III, [name], [name] Weeks in Treatment: 10 Encounter Discharge Information Items Discharge Condition: Stable Ambulatory Status: Ambulatory Discharge Destination: Home Transportation:  Private Auto Accompanied By: alone Schedule Follow-up Appointment: Yes Clinical Summary of Care: Patient Declined Electronic Signature(s) Signed: 01/04/2019 6:27:46 PM By: [name] RN, BSN Entered By: [name] on 12/29/2018 10:07:20 -------------------------------------------------------------------------------- Vitals Details Patient Name: Date of Service: MIO, HOLAN 12/29/2018 7:00 AM Medical Record [id number] Patient Account Number: [id number] Date of Birth/Sex: Treating RN: [birth year] (58 y.o. [name] Primary Care Elyce Zollinger: [name] Other Clinician: Referring Emmalene Kattner: Treating Coltin Casher/Extender:Stone III, [name], [name] Weeks in Treatment: 10 Vital Signs Time Taken: 06:54 Temperature (F): 98.5 Height (in): 70 Pulse (bpm): 66 Weight (lbs): 255 Respiratory Rate (breaths/min): 18 Body Mass Index (BMI): 36.6 Blood Pressure (mmHg): 117/64 Capillary Blood Glucose (mg/dl): 215 Reference Range: 80 - 120 mg / dl Electronic Signature(s) Signed: 01/04/2019 6:27:46 PM By: [name] RN, BSN Entered By: [name] on 12/29/2018 08:00:21

## 2019-01-05 ENCOUNTER — Other Ambulatory Visit: Payer: Self-pay

## 2019-01-05 ENCOUNTER — Encounter (HOSPITAL_BASED_OUTPATIENT_CLINIC_OR_DEPARTMENT_OTHER): Payer: Medicare HMO | Attending: Internal Medicine | Admitting: Internal Medicine

## 2019-01-05 DIAGNOSIS — Z8546 Personal history of malignant neoplasm of prostate: Secondary | ICD-10-CM | POA: Insufficient documentation

## 2019-01-05 DIAGNOSIS — N3041 Irradiation cystitis with hematuria: Secondary | ICD-10-CM | POA: Diagnosis present

## 2019-01-05 DIAGNOSIS — Y842 Radiological procedure and radiotherapy as the cause of abnormal reaction of the patient, or of later complication, without mention of misadventure at the time of the procedure: Secondary | ICD-10-CM | POA: Diagnosis not present

## 2019-01-05 LAB — GLUCOSE, CAPILLARY
Glucose-Capillary: 133 mg/dL — ABNORMAL HIGH (ref 70–99)
Glucose-Capillary: 132 mg/dL — ABNORMAL HIGH (ref 70–99)

## 2019-01-05 NOTE — Progress Notes (Signed)
[name], [name] ([id number]) Visit Report for 01/05/2019 SuperBill Details Patient Name: Date of Service: [name], [name] 01/05/2019 Medical Record [id number] Patient Account Number: [id number] Date of Birth/Sex: Treating RN: [birth year] (58 y.o. M) Primary Care Provider: [name] Other Clinician: Mikeal Hawthorne Referring Provider: Treating Provider/Extender:Dao Memmott, [name], Alyson Locket in Treatment: 11 Diagnosis Coding ICD-10 Codes Code Description N30.41 Irradiation cystitis with hematuria Z85.46 Personal history of malignant neoplasm of prostate Facility Procedures CPT4 Code Description Modifier Quantity [id number]-(Facility Use Only) HBOT, full body chamber, 74min 4 Physician Procedures CPT4 Code Description Modifier Quantity [id number] - WC PHYS HYPERBARIC OXYGEN THERAPY 1 ICD-10 Diagnosis Description N30.41 Irradiation cystitis with hematuria Electronic Signature(s) Signed: 01/05/2019 4:40:25 PM By: Mikeal Hawthorne EMT/HBOT Signed: 01/05/2019 6:45:39 PM By: Linton Ham MD Entered By: Mikeal Hawthorne on 01/05/2019 10:50:17

## 2019-01-05 NOTE — Progress Notes (Signed)
BURYL, SCHNEIDERS ([id number]) Visit Report for 01/05/2019 Arrival Information Details Patient Name: Date of Service: [name], [name] 01/05/2019 8:00 AM Medical Record [id number] Patient Account Number: [id number] Date of Birth/Sex: Treating RN: 07-Feb-1960 (58 y.o. M) Primary Care Sonora Catlin: [name] Other Clinician: Mikeal Hawthorne Referring Arles Rumbold: Treating Jheremy Boger/Extender:Robson, [name], Alyson Locket in Treatment: 11 Visit Information History Since Last Visit Added or deleted any medications: No Patient Arrived: Ambulatory Any new allergies or adverse reactions: No Arrival Time: 08:00 Had a fall or experienced change in No Accompanied By: self activities of daily living that may affect Transfer Assistance: None risk of falls: Patient Identification Verified: Yes Signs or symptoms of abuse/neglect since last No Secondary Verification Process Yes visito Completed: Hospitalized since last visit: No Patient Requires Transmission-Based No Implantable device outside of the clinic excluding No Precautions: cellular tissue based products placed in the center Patient Has Alerts: No since last visit: Pain Present Now: No Electronic Signature(s) Signed: 01/05/2019 4:40:25 PM By: Mikeal Hawthorne EMT/HBOT Entered By: Mikeal Hawthorne on 01/05/2019 08:29:40 -------------------------------------------------------------------------------- Encounter Discharge Information Details Patient Name: Date of Service: [name], [name] 01/05/2019 8:00 AM Medical Record [id number] Patient Account Number: [id number] Date of Birth/Sex: Treating RN: 05-04-1960 (58 y.o. M) Primary Care Jonelle Bann: [name] Other Clinician: Mikeal Hawthorne Referring Mariesa Grieder: Treating Alaria Oconnor/Extender:Robson, [name], Alyson Locket in Treatment: 11 Encounter Discharge Information Items Discharge Condition: Stable Ambulatory Status: Ambulatory Discharge Destination: Home Transportation:  Private Auto Accompanied By: self Schedule Follow-up Appointment: Yes Clinical Summary of Care: Patient Declined Electronic Signature(s) Signed: 01/05/2019 4:40:25 PM By: Mikeal Hawthorne EMT/HBOT Entered By: Mikeal Hawthorne on 01/05/2019 10:50:52 -------------------------------------------------------------------------------- Patient/Caregiver Education Details Patient Name: Date of Service: [name], [name] 12/1/2020andnbsp8:00 AM Medical Record [id number] Patient Account Number: [id number] Date of Birth/Gender: [birth year] (58 y.o. M) Treating RN: Primary Care Physician: [name] Other Clinician: Mikeal Hawthorne Referring Physician: Treating Physician/Extender:Robson, [name], Alyson Locket in Treatment: 11 Education Assessment Education Provided To: Patient Education Topics Provided Hyperbaric Oxygenation: Methods: Explain/Verbal Responses: State content correctly Electronic Signature(s) Signed: 01/05/2019 4:40:25 PM By: Mikeal Hawthorne EMT/HBOT Entered By: Mikeal Hawthorne on 01/05/2019 10:50:31 -------------------------------------------------------------------------------- Vitals Details Patient Name: Date of Service: [name], [name] 01/05/2019 8:00 AM Medical Record [id number] Patient Account Number: [id number] Date of Birth/Sex: Treating RN: November 27, 1960 (58 y.o. M) Primary Care Carrissa Taitano: [name] Other Clinician: Mikeal Hawthorne Referring Tarica Harl: Treating Jhonathan Desroches/Extender:Robson, [name], Alyson Locket in Treatment: 11 Vital Signs Time Taken: 08:05 Temperature (F): 97.6 Height (in): 70 Pulse (bpm): 55 Weight (lbs): 255 Respiratory Rate (breaths/min): 16 Body Mass Index (BMI): 36.6 Blood Pressure (mmHg): 137/78 Capillary Blood Glucose (mg/dl): 133 Reference Range: 80 - 120 mg / dl Electronic Signature(s) Signed: 01/05/2019 4:40:25 PM By: Mikeal Hawthorne EMT/HBOT Entered By: Mikeal Hawthorne on 01/05/2019 08:30:01

## 2019-01-05 NOTE — Progress Notes (Addendum)
KOLYN, LUEBBERT ([id number]) Visit Report for 01/05/2019 HBO Details Patient Name: Date of Service: [name], [name] 01/05/2019 8:00 AM Medical Record [id number] Patient Account Number: [id number] Date of Birth/Sex: Treating RN: 01-Oct-1960 (58 y.o. M) Primary Care Tydarius Yawn: [name] Other Clinician: Mikeal Hawthorne Referring Mystie Ormand: Treating Maicey Barrientez/Extender:Robson, [name], Alyson Locket in Treatment: 11 HBO Treatment Course Details Treatment Course Number: 1 Ordering Lashanna Angelo: Linton Ham Total Treatments Ordered: 40 HBO Treatment Start Date: 11/16/2018 HBO Indication: Late Effect of Radiation HBO Treatment Details Treatment Number: 27 Patient Type: Outpatient Chamber Type: Monoplace Chamber Serial #: [id number] Treatment Protocol: 2.5 ATA with 90 minutes oxygen, with two 5 minute air breaks Treatment Details Compression Rate Down: 2.0 psi / minute De-Compression Rate Up: 2.0 psi / minute Air breaks and CompressTx Pressure breathing periods DecompressDecompress Begins Reached (leave unused spaces Begins Ends blank) Chamber Pressure (ATA)1 2.5 2.5 2.5 2.5 2.5 --2.5 1 Clock Time (24 hr) 08:16 08:28 [id number] 10:20 Treatment Length: 124 (minutes) Treatment Segments: 4 Vital Signs Capillary Blood Glucose Reference Range: 80 - 120 mg / dl HBO Diabetic Blood Glucose Intervention Range: <131 mg/dl or >249 mg/dl Time Vitals Blood Respiratory Capillary Blood Glucose Pulse Action Type: Pulse: Temperature: Taken: Pressure: Rate: Glucose (mg/dl): Meter #: Oximetry (%) Taken: Pre 08:05 137/78 55 16 97.6 133 Post 10:22 138/89 54 18 98 132 Treatment Response Treatment Toleration: Well Treatment Completion Treatment Completed without Adverse Event Status: Jensine Luz Notes No concerns with treatment given Physician HBO Attestation: I certify that I supervised this HBO treatment in accordance with Medicare guidelines. A trained Yes Yes emergency  response team is readily available per hospital policies and procedures. Continue HBOT as ordered. Yes Electronic Signature(s) Signed: 01/05/2019 6:45:39 PM By: Linton Ham MD Previous Signature: 01/05/2019 4:40:25 PM Version By: Mikeal Hawthorne EMT/HBOT Entered By: Linton Ham on 01/05/2019 18:42:40 -------------------------------------------------------------------------------- HBO Safety Checklist Details Patient Name: Date of Service: [name], [name] 01/05/2019 8:00 AM Medical Record [id number] Patient Account Number: [id number] Date of Birth/Sex: Treating RN: 03-Jul-1960 (58 y.o. M) Primary Care Phoenix Dresser: [name] Other Clinician: Mikeal Hawthorne Referring Leanne Sisler: Treating Ralf Konopka/Extender:Robson, [name], Alyson Locket in Treatment: 11 HBO Safety Checklist Items Safety Checklist Consent Form Signed Patient voided / foley secured and emptied When did you last eato 0700 - coffee Last dose of injectable or oral agent metformin NA Ostomy pouch emptied and vented if applicable NA All implantable devices assessed, documented and approved NA Intravenous access site secured and place Valuables secured Linens and cotton and cotton/polyester blend (less than 51% polyester) Personal oil-based products / skin lotions / body lotions removed NA Wigs or hairpieces removed NA Smoking or tobacco materials removed Books / newspapers / magazines / loose paper removed Cologne, aftershave, perfume and deodorant removed Jewelry removed (may wrap wedding band) NA Make-up removed Hair care products removed NA Battery operated devices (external) removed NA Heating patches and chemical warmers removed NA Titanium eyewear removed NA Nail polish cured greater than 10 hours NA Casting material cured greater than 10 hours NA Hearing aids removed Loose dentures or partials removed NA Prosthetics have been removed Patient demonstrates correct use of air break device (if  applicable) Patient concerns have been addressed Patient grounding bracelet on and cord attached to chamber Specifics for Inpatients (complete in addition to above) Medication sheet sent with patient Intravenous medications needed or due during therapy sent with patient Drainage tubes (e.g. nasogastric tube or chest tube secured and vented) Endotracheal or Tracheotomy tube secured Cuff deflated of air and inflated with saline Airway  suctioned Electronic Signature(s) Signed: 01/05/2019 8:30:44 AM By: Mikeal Hawthorne EMT/HBOT Entered By: Mikeal Hawthorne on 01/05/2019 08:30:44

## 2019-01-06 ENCOUNTER — Encounter (HOSPITAL_BASED_OUTPATIENT_CLINIC_OR_DEPARTMENT_OTHER): Payer: Medicare HMO | Admitting: Physician Assistant

## 2019-01-06 DIAGNOSIS — N3041 Irradiation cystitis with hematuria: Secondary | ICD-10-CM | POA: Diagnosis not present

## 2019-01-06 LAB — GLUCOSE, CAPILLARY
Glucose-Capillary: 192 mg/dL — ABNORMAL HIGH (ref 70–99)
Glucose-Capillary: 124 mg/dL — ABNORMAL HIGH (ref 70–99)

## 2019-01-06 NOTE — Progress Notes (Addendum)
CORDARIO, BRAUDE ([id number]) Visit Report for 01/06/2019 HBO Details Patient Name: Date of Service: [name], [name] 01/06/2019 8:00 AM Medical Record [id number] Patient Account Number: [id number] Date of Birth/Sex: Treating RN: August 23, 1960 (58 y.o. M) Primary Care Aalina Brege: [name] Other Clinician: Mikeal Hawthorne Referring Adreana Coull: Treating Nichele Slawson/Extender:Stone III, [name], [name] Weeks in Treatment: 11 HBO Treatment Course Details Treatment Course Number: 1 Ordering Roger Kettles: Linton Ham Total Treatments Ordered: 40 HBO Treatment Start Date: 11/16/2018 HBO Indication: Late Effect of Radiation HBO Treatment Details Treatment Number: 28 Patient Type: Outpatient Chamber Type: Monoplace Chamber Serial #: [id number] Treatment Protocol: 2.5 ATA with 90 minutes oxygen, with two 5 minute air breaks Treatment Details Compression Rate Down: 2.0 psi / minute De-Compression Rate Up: 2.0 psi / minute Air breaks and CompressTx Pressure breathing periods DecompressDecompress Begins Reached (leave unused spaces Begins Ends blank) Chamber Pressure (ATA)1 2.5 2.5 2.5 2.5 2.5 --2.5 1 Clock Time (24 hr) 08:10 08:22 [id number] 10:14 Treatment Length: 124 (minutes) Treatment Segments: 4 Vital Signs Capillary Blood Glucose Reference Range: 80 - 120 mg / dl HBO Diabetic Blood Glucose Intervention Range: <131 mg/dl or >249 mg/dl Time Vitals Blood Respiratory Capillary Blood Glucose Pulse Action Type: Pulse: Temperature: Taken: Pressure: Rate: Glucose (mg/dl): Meter #: Oximetry (%) Taken: Pre 07:50 132/69 60 17 97.9 192 Post 10:17 160/95 51 15 98 124 Treatment Response Treatment Toleration: Well Treatment Completion Treatment Completed without Adverse Event Status: Physician HBO Attestation: I certify that I supervised this HBO treatment in accordance with Medicare guidelines. A trained Yes emergency response team is readily available per hospital  policies and procedures. Continue HBOT as ordered. Yes Electronic Signature(s) Signed: 01/07/2019 10:31:49 AM By: [name] PA-C Previous Signature: 01/06/2019 4:25:17 PM Version By: Mikeal Hawthorne EMT/HBOT Entered By: [name] on 01/07/2019 10:31:49 -------------------------------------------------------------------------------- HBO Safety Checklist Details Patient Name: Date of Service: [name], [name] 01/06/2019 8:00 AM Medical Record [id number] Patient Account Number: [id number] Date of Birth/Sex: Treating RN: 09-10-1960 (58 y.o. M) Primary Care Oluwateniola Leitch: [name] Other Clinician: Mikeal Hawthorne Referring Rashi Giuliani: Treating Adonys Wildes/Extender:Stone III, [name], [name] Weeks in Treatment: 11 HBO Safety Checklist Items Safety Checklist Consent Form Signed Patient voided / foley secured and emptied When did you last eato 0700 - egg sammy - coffee Last dose of injectable or oral agent metformin NA Ostomy pouch emptied and vented if applicable NA All implantable devices assessed, documented and approved NA Intravenous access site secured and place Valuables secured Linens and cotton and cotton/polyester blend (less than 51% polyester) Personal oil-based products / skin lotions / body lotions removed NA Wigs or hairpieces removed NA Smoking or tobacco materials removed Books / newspapers / magazines / loose paper removed Cologne, aftershave, perfume and deodorant removed Jewelry removed (may wrap wedding band) NA Make-up removed Hair care products removed NA Battery operated devices (external) removed NA Heating patches and chemical warmers removed NA Titanium eyewear removed NA Nail polish cured greater than 10 hours NA Casting material cured greater than 10 hours NA Hearing aids removed Loose dentures or partials removed NA Prosthetics have been removed Patient demonstrates correct use of air break device (if applicable) Patient concerns have been  addressed Patient grounding bracelet on and cord attached to chamber Specifics for Inpatients (complete in addition to above) Medication sheet sent with patient Intravenous medications needed or due during therapy sent with patient Drainage tubes (e.g. nasogastric tube or chest tube secured and vented) Endotracheal or Tracheotomy tube secured Cuff deflated of air and inflated with saline Airway suctioned  Electronic Signature(s) Signed: 01/06/2019 10:47:18 AM By: Mikeal Hawthorne EMT/HBOT Previous Signature: 01/06/2019 8:26:38 AM Version By: Mikeal Hawthorne EMT/HBOT Entered By: Mikeal Hawthorne on 01/06/2019 10:47:17

## 2019-01-06 NOTE — Progress Notes (Signed)
[name], [name] ([id number]) Visit Report for 01/06/2019 Arrival Information Details Patient Name: Date of Service: [name], [name] 01/06/2019 8:00 AM Medical Record [id number] Patient Account Number: [id number] Date of Birth/Sex: Treating RN: October 25, 1960 (58 y.o. M) Primary Care [name]: [name] Other Clinician: Mikeal [name] Referring [name]: Treating [name]/Extender:[name], [name] in Treatment: 11 Visit Information History Since Last Visit Added or deleted any medications: No Patient Arrived: Ambulatory Any new allergies or adverse reactions: No Arrival Time: 07:45 Had a fall or experienced change in No Accompanied By: self activities of daily living that may affect Transfer Assistance: None risk of falls: Patient Identification Verified: Yes Signs or symptoms of abuse/neglect since last No Secondary Verification Process Yes visito Completed: Hospitalized since last visit: No Patient Requires Transmission-Based No Implantable device outside of the clinic excluding No Precautions: cellular tissue based products placed in the center Patient Has Alerts: No since last visit: Pain Present Now: No Electronic Signature(s) Signed: 01/06/2019 4:25:17 PM By: [name] EMT/HBOT Entered By: [name] on 01/06/2019 10:46:59 -------------------------------------------------------------------------------- Encounter Discharge Information Details Patient Name: Date of Service: [name] 01/06/2019 8:00 AM Medical Record [id number] Patient Account Number: [id number] Date of Birth/Sex: Treating RN: [birth year] (58 y.o. M) Primary Care [name]: [name] Other Clinician: Mikeal [name] Referring [name]: Treating [name]/Extender:[name], [name] in Treatment: 11 Encounter Discharge Information Items Discharge Condition: Stable Ambulatory Status: Ambulatory Discharge Destination: Home Transportation:  Private Auto Accompanied By: self Schedule Follow-up Appointment: Yes Clinical Summary of Care: Patient Declined Electronic Signature(s) Signed: 01/06/2019 4:25:17 PM By: [name] EMT/HBOT Entered By: [name] on 01/06/2019 10:48:48 -------------------------------------------------------------------------------- Patient/Caregiver Education Details Patient Name: Date of Service: [name] 12/2/2020andnbsp8:00 AM Medical Record [id number] Patient Account Number: [id number] Date of Birth/Gender: [birth year] (58 y.o. M) Treating RN: Primary Care Physician: [name] Other Clinician: Mikeal [name] Referring Physician: Treating Physician/Extender:[name], [name] in Treatment: 11 Education Assessment Education Provided To: Patient Education Topics Provided Hyperbaric Oxygenation: Methods: Explain/Verbal Responses: State content correctly Electronic Signature(s) Signed: 01/06/2019 4:25:17 PM By: [name] EMT/HBOT Entered By: [name] on 01/06/2019 10:48:34 -------------------------------------------------------------------------------- Vitals Details Patient Name: Date of Service: [name] 01/06/2019 8:00 AM Medical Record [id number] Patient Account Number: [id number] Date of Birth/Sex: Treating RN: 01-21-1961 (58 y.o. M) Primary Care [name]: [name] Other Clinician: Mikeal [name] Referring [name]: Treating [name]/Extender:[name], [name] in Treatment: 11 Vital Signs Time Taken: 07:50 Temperature (F): 97.9 Height (in): 70 Pulse (bpm): 60 Weight (lbs): 255 Respiratory Rate (breaths/min): 17 Body Mass Index (BMI): 36.6 Blood Pressure (mmHg): 132/69 Capillary Blood Glucose (mg/dl): 192 Reference Range: 80 - 120 mg / dl Electronic Signature(s) Signed: 01/06/2019 4:25:17 PM By: [name] EMT/HBOT Entered By: [name] on 01/06/2019 10:47:04

## 2019-01-07 ENCOUNTER — Other Ambulatory Visit: Payer: Self-pay

## 2019-01-07 ENCOUNTER — Encounter (HOSPITAL_BASED_OUTPATIENT_CLINIC_OR_DEPARTMENT_OTHER): Payer: Medicare HMO | Admitting: Internal Medicine

## 2019-01-07 DIAGNOSIS — N3041 Irradiation cystitis with hematuria: Secondary | ICD-10-CM | POA: Diagnosis not present

## 2019-01-07 LAB — GLUCOSE, CAPILLARY
Glucose-Capillary: 153 mg/dL — ABNORMAL HIGH (ref 70–99)
Glucose-Capillary: 123 mg/dL — ABNORMAL HIGH (ref 70–99)

## 2019-01-07 NOTE — Progress Notes (Signed)
KAPONE, WATZ ([id number]) Visit Report for 01/07/2019 Arrival Information Details Patient Name: Date of Service: [name], [name] 01/07/2019 8:00 AM Medical Record [id number] Patient Account Number: [id number] Date of Birth/Sex: Treating RN: 1960/04/16 (58 y.o. M) Primary Care Drezden Seitzinger: [name] Other Clinician: Mikeal Hawthorne Referring Zierra Laroque: Treating Zylee Marchiano/Extender:Robson, [name], Alyson Locket in Treatment: 11 Visit Information History Since Last Visit Added or deleted any medications: No Patient Arrived: Ambulatory Any new allergies or adverse reactions: No Arrival Time: 07:45 Had a fall or experienced change in No Accompanied By: self activities of daily living that may affect Transfer Assistance: None risk of falls: Patient Identification Verified: Yes Signs or symptoms of abuse/neglect since last No Secondary Verification Process Yes visito Completed: Hospitalized since last visit: No Patient Requires Transmission-Based No Implantable device outside of the clinic excluding No Precautions: cellular tissue based products placed in the center Patient Has Alerts: No since last visit: Pain Present Now: No Electronic Signature(s) Signed: 01/07/2019 3:43:57 PM By: Mikeal Hawthorne EMT/HBOT Entered By: Mikeal Hawthorne on 01/07/2019 08:34:36 -------------------------------------------------------------------------------- Encounter Discharge Information Details Patient Name: Date of Service: [name], [name] 01/07/2019 8:00 AM Medical Record [id number] Patient Account Number: [id number] Date of Birth/Sex: Treating RN: 21-Dec-1960 (58 y.o. M) Primary Care Marielouise Amey: [name] Other Clinician: Mikeal Hawthorne Referring Jairon Ripberger: Treating Jakarie Pember/Extender:Robson, [name], Alyson Locket in Treatment: 11 Encounter Discharge Information Items Discharge Condition: Stable Ambulatory Status: Ambulatory Discharge Destination: Home Transportation:  Private Auto Accompanied By: self Schedule Follow-up Appointment: Yes Clinical Summary of Care: Patient Declined Electronic Signature(s) Signed: 01/07/2019 3:43:57 PM By: Mikeal Hawthorne EMT/HBOT Entered By: Mikeal Hawthorne on 01/07/2019 11:01:35 -------------------------------------------------------------------------------- Patient/Caregiver Education Details Patient Name: Date of Service: [name], [name] 12/3/2020andnbsp8:00 AM Medical Record [id number] Patient Account Number: [id number] Date of Birth/Gender: [birth year] (58 y.o. M) Treating RN: Primary Care Physician: [name] Other Clinician: Mikeal Hawthorne Referring Physician: Treating Physician/Extender:Robson, [name], Alyson Locket in Treatment: 11 Education Assessment Education Provided To: Patient Education Topics Provided Hyperbaric Oxygenation: Methods: Explain/Verbal Responses: State content correctly Electronic Signature(s) Signed: 01/07/2019 3:43:57 PM By: Mikeal Hawthorne EMT/HBOT Entered By: Mikeal Hawthorne on 01/07/2019 11:01:24 -------------------------------------------------------------------------------- Vitals Details Patient Name: Date of Service: [name], [name] 01/07/2019 8:00 AM Medical Record [id number] Patient Account Number: [id number] Date of Birth/Sex: Treating RN: [birth year] (58 y.o. M) Primary Care Arne Schlender: [name] Other Clinician: Mikeal Hawthorne Referring Kingston Guiles: Treating [name] Carias/Extender:Robson, [name], Alyson Locket in Treatment: 11 Vital Signs Time Taken: 07:50 Temperature (F): 97.6 Height (in): 70 Pulse (bpm): 53 Weight (lbs): 255 Respiratory Rate (breaths/min): 16 Body Mass Index (BMI): 36.6 Blood Pressure (mmHg): 161/94 Capillary Blood Glucose (mg/dl): 153 Reference Range: 80 - 120 mg / dl Electronic Signature(s) Signed: 01/07/2019 3:43:57 PM By: Mikeal Hawthorne EMT/HBOT Entered By: Mikeal Hawthorne on 01/07/2019 08:34:53

## 2019-01-07 NOTE — Progress Notes (Addendum)
[name], [name] ([id number]) Visit Report for 01/07/2019 HBO Details Patient Name: Date of Service: [name], [name] 01/07/2019 8:00 AM Medical Record [id number] Patient Account Number: [id number] Date of Birth/Sex: Treating RN: February 19, 1960 (58 y.o. M) Primary Care Jaskarn Schweer: [name] Other Clinician: Mikeal Hawthorne Referring Reyan Helle: Treating Yaviel Kloster/Extender:Robson, [name], Alyson Locket in Treatment: 11 HBO Treatment Course Details Treatment Course Number: 1 Ordering Dalonte Hardage: Linton Ham Total Treatments Ordered: 40 HBO Treatment Start Date: 11/16/2018 HBO Indication: Late Effect of Radiation HBO Treatment Details Treatment Number: 29 Patient Type: Outpatient Chamber Type: Monoplace Chamber Serial #: [id number] Treatment Protocol: 2.5 ATA with 90 minutes oxygen, with two 5 minute air breaks Treatment Details Compression Rate Down: 2.0 psi / minute De-Compression Rate Up: 2.0 psi / minute Air breaks and CompressTx Pressure breathing periods DecompressDecompress Begins Reached (leave unused spaces Begins Ends blank) Chamber Pressure (ATA)1 2.5 2.5 2.5 2.5 2.5 --2.5 1 Clock Time (24 hr) 08:02 08:14 [id number] 10:06 Treatment Length: 124 (minutes) Treatment Segments: 4 Vital Signs Capillary Blood Glucose Reference Range: 80 - 120 mg / dl HBO Diabetic Blood Glucose Intervention Range: <131 mg/dl or >249 mg/dl Time Vitals Blood Respiratory Capillary Blood Glucose Pulse Action Type: Pulse: Temperature: Taken: Pressure: Rate: Glucose (mg/dl): Meter #: Oximetry (%) Taken: Pre 07:50 161/94 53 16 97.6 153 Post 10:10 156/94 50 14 97.9 123 Treatment Response Treatment Toleration: Well Treatment Completion Treatment Completed without Adverse Event Status: Shonn Farruggia Notes No concerns with treatment given Physician HBO Attestation: I certify that I supervised this HBO treatment in accordance with Medicare guidelines. A  trained Yes Yes emergency response team is readily available per hospital policies and procedures. Continue HBOT as ordered. Yes Electronic Signature(s) Signed: 01/08/2019 7:57:15 AM By: Linton Ham MD Previous Signature: 01/07/2019 3:43:57 PM Version By: Mikeal Hawthorne EMT/HBOT Entered By: Linton Ham on 01/07/2019 18:12:13 -------------------------------------------------------------------------------- HBO Safety Checklist Details Patient Name: Date of Service: [name], [name] 01/07/2019 8:00 AM Medical Record [id number] Patient Account Number: [id number] Date of Birth/Sex: Treating RN: 10-13-1960 (58 y.o. M) Primary Care Kenya Shiraishi: [name] Other Clinician: Mikeal Hawthorne Referring Neils Siracusa: Treating Valdis Bevill/Extender:Robson, [name], Alyson Locket in Treatment: 11 HBO Safety Checklist Items Safety Checklist Consent Form Signed Patient voided / foley secured and emptied When did you last eato 0700 - coffee - boiled egg Last dose of injectable or oral agent metformin NA Ostomy pouch emptied and vented if applicable NA All implantable devices assessed, documented and approved NA Intravenous access site secured and place Valuables secured Linens and cotton and cotton/polyester blend (less than 51% polyester) Personal oil-based products / skin lotions / body lotions removed NA Wigs or hairpieces removed NA Smoking or tobacco materials removed Books / newspapers / magazines / loose paper removed Cologne, aftershave, perfume and deodorant removed Jewelry removed (may wrap wedding band) NA Make-up removed Hair care products removed NA Battery operated devices (external) removed NA Heating patches and chemical warmers removed NA Titanium eyewear removed NA Nail polish cured greater than 10 hours NA Casting material cured greater than 10 hours NA Hearing aids removed Loose dentures or partials removed NA Prosthetics have been removed Patient demonstrates  correct use of air break device (if applicable) Patient concerns have been addressed Patient grounding bracelet on and cord attached to chamber Specifics for Inpatients (complete in addition to above) Medication sheet sent with patient Intravenous medications needed or due during therapy sent with patient Drainage tubes (e.g. nasogastric tube or chest tube secured and vented) Endotracheal or Tracheotomy tube secured Cuff deflated of air and inflated  with saline Airway suctioned Electronic Signature(s) Signed: 01/07/2019 8:35:53 AM By: Mikeal Hawthorne EMT/HBOT Entered By: Mikeal Hawthorne on 01/07/2019 08:35:52

## 2019-01-07 NOTE — Progress Notes (Signed)
JAQUAY, LUCIO ([id number]) Visit Report for 01/06/2019 Problem List Details Patient Name: Date of Service: ITZAE, CRAWL 01/06/2019 8:00 AM Medical Record [id number] Patient Account Number: [id number] Date of Birth/Sex: Treating RN: September 25, 1960 (58 y.o. M) Primary Care Provider: [name] Other Clinician: Referring Provider: Treating Provider/Extender:Stone III, [name], [name] Weeks in Treatment: 11 Active Problems ICD-10 Evaluated Encounter Code Description Active Date Today Diagnosis N30.41 Irradiation cystitis with hematuria 10/20/2018 No Yes Z85.46 Personal history of malignant neoplasm of prostate 10/20/2018 No Yes Inactive Problems Resolved Problems Electronic Signature(s) Signed: 01/07/2019 10:31:58 AM By: [name] PA-C Entered By: [name] on 01/07/2019 10:31:57 -------------------------------------------------------------------------------- SuperBill Details Patient Name: Date of Service: LATRICE, KAISER 01/06/2019 Medical Record [id number] Patient Account Number: [id number] Date of Birth/Sex: Treating RN: 07-22-1960 (58 y.o. M) Primary Care Provider: [name] Other Clinician: Mikeal Hawthorne Referring Provider: Treating Provider/Extender:Stone III, [name], [name] Weeks in Treatment: 11 Diagnosis Coding ICD-10 Codes Code Description N30.41 Irradiation cystitis with hematuria Z85.46 Personal history of malignant neoplasm of prostate Facility Procedures CPT4 Code Description: [id number] G0277-(Facility Use Only) HBOT, full body chamber, 30min Modifier: Quantity: 4 Physician Procedures CPT4 Code Description: [id number] - WC PHYS HYPERBARIC OXYGEN THERAPY ICD-10 Diagnosis Description N30.41 Irradiation cystitis with hematuria Modifier: Quantity: 1 Electronic Signature(s) Signed: 01/07/2019 10:31:55 AM By: [name] PA-C Previous Signature: 01/06/2019 4:25:17 PM Version By: Mikeal Hawthorne EMT/HBOT Entered By: [name]  on 01/07/2019 10:31:54

## 2019-01-08 ENCOUNTER — Encounter (HOSPITAL_BASED_OUTPATIENT_CLINIC_OR_DEPARTMENT_OTHER): Payer: Medicare HMO | Admitting: Internal Medicine

## 2019-01-08 NOTE — Progress Notes (Signed)
[name], [name] ([id number]) Visit Report for 01/07/2019 SuperBill Details Patient Name: Date of Service: [name], [name] 01/07/2019 Medical Record [id number] Patient Account Number: [id number] Date of Birth/Sex: Treating RN: 02-20-1960 (58 y.o. M) Primary Care Provider: [name] Other Clinician: Mikeal Hawthorne Referring Provider: Treating Provider/Extender:Robson, [name], Alyson Locket in Treatment: 11 Diagnosis Coding ICD-10 Codes Code Description N30.41 Irradiation cystitis with hematuria Z85.46 Personal history of malignant neoplasm of prostate Facility Procedures CPT4 Code Description Modifier Quantity [id number] G0277-(Facility Use Only) HBOT, full body chamber, 45min 4 Physician Procedures CPT4 Code Description Modifier Quantity [id number] - WC PHYS HYPERBARIC OXYGEN THERAPY 1 ICD-10 Diagnosis Description N30.41 Irradiation cystitis with hematuria Electronic Signature(s) Signed: 01/07/2019 3:43:57 PM By: Mikeal Hawthorne EMT/HBOT Signed: 01/08/2019 7:57:15 AM By: Linton Ham MD Entered By: Mikeal Hawthorne on 01/07/2019 11:01:13

## 2019-01-11 ENCOUNTER — Encounter (HOSPITAL_BASED_OUTPATIENT_CLINIC_OR_DEPARTMENT_OTHER): Payer: Medicare HMO | Admitting: Internal Medicine

## 2019-01-11 ENCOUNTER — Other Ambulatory Visit: Payer: Self-pay

## 2019-01-11 DIAGNOSIS — N3041 Irradiation cystitis with hematuria: Secondary | ICD-10-CM | POA: Diagnosis not present

## 2019-01-11 LAB — GLUCOSE, CAPILLARY
Glucose-Capillary: 153 mg/dL — ABNORMAL HIGH (ref 70–99)
Glucose-Capillary: 111 mg/dL — ABNORMAL HIGH (ref 70–99)

## 2019-01-11 NOTE — Progress Notes (Addendum)
MYCAH, DELCORE ([id number]) Visit Report for 01/11/2019 HBO Details Patient Name: Date of Service: [name], [name] 01/11/2019 8:00 AM Medical Record [id number] Patient Account Number: [id number] Date of Birth/Sex: Treating RN: [birth year] (58 y.o. M) Primary Care Lelon Ikard: [name] Other Clinician: Mikeal Hawthorne Referring Mubashir Mallek: Treating Maudie Shingledecker/Extender:Robson, [name], Alyson Locket in Treatment: 11 HBO Treatment Course Details Treatment Course Number: 1 Ordering Jewett Mcgann: Linton Ham Total Treatments Ordered: 40 HBO Treatment Start Date: 11/16/2018 HBO Indication: Late Effect of Radiation HBO Treatment Details Treatment Number: 30 Patient Type: Outpatient Chamber Type: Monoplace Chamber Serial #: [id number] Treatment Protocol: 2.5 ATA with 90 minutes oxygen, with two 5 minute air breaks Treatment Details Compression Rate Down: 2.0 psi / minute De-Compression Rate Up: 2.0 psi / minute Air breaks and CompressTx Pressure breathing periods DecompressDecompress Begins Reached (leave unused spaces Begins Ends blank) Chamber Pressure (ATA)1 2.5 2.5 2.5 2.5 2.5 --2.5 1 Clock Time (24 hr) 08:05 08:17 [id number] 10:09 Treatment Length: 124 (minutes) Treatment Segments: 4 Vital Signs Capillary Blood Glucose Reference Range: 80 - 120 mg / dl HBO Diabetic Blood Glucose Intervention Range: <131 mg/dl or >249 mg/dl Time Vitals Blood Respiratory Capillary Blood Glucose Pulse Action Type: Pulse: Temperature: Taken: Pressure: Rate: Glucose (mg/dl): Meter #: Oximetry (%) Taken: Pre 07:50 135/80 62 17 98.1 153 Post 10:11 163/92 58 15 98.4 111 Treatment Response Treatment Toleration: Well Treatment Completion Treatment Completed without Adverse Event Status: Mayli Covington Notes No concerns with treatment given Physician HBO Attestation: I certify that I supervised this HBO treatment in accordance with Medicare guidelines. A  trained Yes Yes emergency response team is readily available per hospital policies and procedures. Continue HBOT as ordered. Yes Electronic Signature(s) Signed: 01/12/2019 10:01:17 AM By: Linton Ham MD Entered By: Linton Ham on 01/11/2019 17:27:51 -------------------------------------------------------------------------------- HBO Safety Checklist Details Patient Name: Date of Service: [name], [name] 01/11/2019 8:00 AM Medical Record [id number] Patient Account Number: [id number] Date of Birth/Sex: Treating RN: 1960-04-21 (58 y.o. M) Primary Care Ivey Cina: [name] Other Clinician: Mikeal Hawthorne Referring Montrice Gracey: Treating Susann Lawhorne/Extender:Robson, [name], Alyson Locket in Treatment: 11 HBO Safety Checklist Items Safety Checklist Consent Form Signed Patient voided / foley secured and emptied When did you last eato 0700 - coffee Last dose of injectable or oral agent metformin NA Ostomy pouch emptied and vented if applicable NA All implantable devices assessed, documented and approved NA Intravenous access site secured and place Valuables secured Linens and cotton and cotton/polyester blend (less than 51% polyester) Personal oil-based products / skin lotions / body lotions removed NA Wigs or hairpieces removed NA Smoking or tobacco materials removed Books / newspapers / magazines / loose paper removed Cologne, aftershave, perfume and deodorant removed Jewelry removed (may wrap wedding band) NA Make-up removed Hair care products removed NA Battery operated devices (external) removed NA Heating patches and chemical warmers removed NA Titanium eyewear removed NA Nail polish cured greater than 10 hours NA Casting material cured greater than 10 hours NA Hearing aids removed Loose dentures or partials removed NA Prosthetics have been removed Patient demonstrates correct use of air break device (if applicable) Patient concerns have been  addressed Patient grounding bracelet on and cord attached to chamber Specifics for Inpatients (complete in addition to above) Medication sheet sent with patient Intravenous medications needed or due during therapy sent with patient Drainage tubes (e.g. nasogastric tube or chest tube secured and vented) Endotracheal or Tracheotomy tube secured Cuff deflated of air and inflated with saline Airway suctioned Electronic Signature(s) Signed: 01/11/2019 8:11:10 AM By: Mikeal Hawthorne  EMT/HBOT Entered By: Mikeal Hawthorne on 01/11/2019 08:11:10

## 2019-01-12 ENCOUNTER — Encounter (HOSPITAL_BASED_OUTPATIENT_CLINIC_OR_DEPARTMENT_OTHER): Payer: Medicare HMO | Admitting: Internal Medicine

## 2019-01-12 DIAGNOSIS — N3041 Irradiation cystitis with hematuria: Secondary | ICD-10-CM | POA: Diagnosis not present

## 2019-01-12 LAB — GLUCOSE, CAPILLARY
Glucose-Capillary: 135 mg/dL — ABNORMAL HIGH (ref 70–99)
Glucose-Capillary: 101 mg/dL — ABNORMAL HIGH (ref 70–99)

## 2019-01-12 NOTE — Progress Notes (Signed)
DEMARKUS, USCANGA ([id number]) Visit Report for 01/11/2019 SuperBill Details Patient Name: Date of Service: JACKIE, KHAWAJA 01/11/2019 Medical Record [id number] Patient Account Number: [id number] Date of Birth/Sex: Treating RN: 1960/11/23 (58 y.o. M) Primary Care Provider: [name] Other Clinician: Mikeal Hawthorne Referring Provider: Treating Provider/Extender:Yer Olivencia, [name], Alyson Locket in Treatment: 11 Diagnosis Coding ICD-10 Codes Code Description N30.41 Irradiation cystitis with hematuria Z85.46 Personal history of malignant neoplasm of prostate Facility Procedures CPT4 Code Description Modifier Quantity [id number] G0277-(Facility Use Only) HBOT, full body chamber, 26min 4 Physician Procedures CPT4 Code Description Modifier Quantity [id number] - WC PHYS HYPERBARIC OXYGEN THERAPY 1 ICD-10 Diagnosis Description N30.41 Irradiation cystitis with hematuria Electronic Signature(s) Signed: 01/12/2019 10:01:17 AM By: Linton Ham MD Signed: 01/12/2019 4:34:42 PM By: Mikeal Hawthorne EMT/HBOT Entered By: Mikeal Hawthorne on 01/11/2019 11:03:40

## 2019-01-12 NOTE — Progress Notes (Signed)
[name] ([id number]) Visit Report for 01/12/2019 Arrival Information Details Patient Name: Date of Service: [name] 01/12/2019 8:00 AM Medical Record [id number] Patient Account Number: [id number] Date of Birth/Sex: Treating RN: 29-May-1960 (58 y.o. M) Primary Care [name]: [name] Other Clinician: Mikeal [name] Referring [name]: Treating [name]/Extender:[name] in Treatment: 12 Visit Information History Since Last Visit Added or deleted any medications: No Patient Arrived: Ambulatory Any new allergies or adverse reactions: No Arrival Time: 07:50 Had a fall or experienced change in No Accompanied By: self activities of daily living that may affect Transfer Assistance: None risk of falls: Patient Identification Verified: Yes Signs or symptoms of abuse/neglect since last No Secondary Verification Process Yes visito Completed: Hospitalized since last visit: No Patient Requires Transmission-Based No Implantable device outside of the clinic excluding No Precautions: cellular tissue based products placed in the center Patient Has Alerts: No since last visit: Pain Present Now: No Electronic Signature(s) Signed: 01/12/2019 4:34:42 PM By: [name] EMT/HBOT Entered By: [name] on 01/12/2019 08:18:01 -------------------------------------------------------------------------------- Encounter Discharge Information Details Patient Name: Date of Service: [name], [name] 01/12/2019 8:00 AM Medical Record [id number] Patient Account Number: [id number] Date of Birth/Sex: Treating RN: 02-07-1960 (58 y.o. M) Primary Care Aurie Harroun: [name] Other Clinician: Mikeal [name] Referring Tiant Peixoto: Treating [name]/Extender:[name] in Treatment: 12 Encounter Discharge Information Items Discharge Condition: Stable Ambulatory Status: Ambulatory Discharge Destination: Home Transportation:  Private Auto Accompanied By: self Schedule Follow-up Appointment: Yes Clinical Summary of Care: Patient Declined Electronic Signature(s) Signed: 01/12/2019 4:34:42 PM By: [name] EMT/HBOT Entered By: [name] on 01/12/2019 11:04:06 -------------------------------------------------------------------------------- Patient/Caregiver Education Details Patient Name: Date of Service: [name], [name] 12/8/2020andnbsp8:00 AM Medical Record [id number] Patient Account Number: [id number] Date of Birth/Gender: 02-21-1960 (58 y.o. M) Treating RN: Primary Care Physician: [name] Other Clinician: Mikeal [name] Referring Physician: Treating Physician/Extender:[name] in Treatment: 12 Education Assessment Education Provided To: Patient Education Topics Provided Hyperbaric Oxygenation: Methods: Explain/Verbal Responses: State content correctly Electronic Signature(s) Signed: 01/12/2019 4:34:42 PM By: [name] EMT/HBOT Entered By: [name] on 01/12/2019 11:03:53 -------------------------------------------------------------------------------- Vitals Details Patient Name: Date of Service: [name], [name] 01/12/2019 8:00 AM Medical Record [id number] Patient Account Number: [id number] Date of Birth/Sex: Treating RN: 1960/09/21 (58 y.o. M) Primary Care Isis Costanza: [name] Other Clinician: Referring Marygrace Sandoval: Treating Rebakah Cokley/Extender:[name] in Treatment: 12 Vital Signs Time Taken: 07:55 Temperature (F): 97.6 Height (in): 70 Pulse (bpm): 61 Weight (lbs): 255 Respiratory Rate (breaths/min): 15 Body Mass Index (BMI): 36.6 Blood Pressure (mmHg): 161/84 Capillary Blood Glucose (mg/dl): 135 Reference Range: 80 - 120 mg / dl Electronic Signature(s) Signed: 01/12/2019 4:34:42 PM By: [name] EMT/HBOT Entered By: [name] on 01/12/2019 08:18:18

## 2019-01-12 NOTE — Progress Notes (Addendum)
MAICON, SALDANHA ([id number]) Visit Report for 01/12/2019 HBO Details Patient Name: Date of Service: [name], [name] 01/12/2019 8:00 AM Medical Record [id number] Patient Account Number: [id number] Date of Birth/Sex: Treating RN: September 23, 1960 (58 y.o. M) Primary Care Laurita Peron: [name] Other Clinician: Mikeal Hawthorne Referring Nello Corro: Treating Quamaine Webb/Extender:Robson, [name], Alyson Locket in Treatment: 12 HBO Treatment Course Details Treatment Course Number: 1 Ordering Matson Welch: Linton Ham Total Treatments Ordered: 40 HBO Treatment Start Date: 11/16/2018 HBO Indication: Late Effect of Radiation HBO Treatment Details Treatment Number: 31 Patient Type: Outpatient Chamber Type: Monoplace Chamber Serial #: [id number] Treatment Protocol: 2.5 ATA with 90 minutes oxygen, with two 5 minute air breaks Treatment Details Compression Rate Down: 2.0 psi / minute De-Compression Rate Up: 2.0 psi / minute Air breaks and CompressTx Pressure breathing periods DecompressDecompress Begins Reached (leave unused spaces Begins Ends blank) Chamber Pressure (ATA)1 2.5 2.5 2.5 2.5 2.5 --2.5 1 Clock Time (24 hr) 08:07 08:19 [id number] 10:11 Treatment Length: 124 (minutes) Treatment Segments: 4 Vital Signs Capillary Blood Glucose Reference Range: 80 - 120 mg / dl HBO Diabetic Blood Glucose Intervention Range: <131 mg/dl or >249 mg/dl Time Vitals Blood Respiratory Capillary Blood Glucose Pulse Action Type: Pulse: Temperature: Taken: Pressure: Rate: Glucose (mg/dl): Meter #: Oximetry (%) Taken: Pre 07:55 161/84 61 15 97.6 135 Post 10:15 129/77 61 15 98 101 Treatment Response Treatment Toleration: Well Treatment Completion Treatment Completed without Adverse Event Status: Francelia Mclaren Notes No concerns with treatment given Physician HBO Attestation: I certify that I supervised this HBO treatment in accordance with Medicare guidelines. A trained Yes Yes emergency  response team is readily available per hospital policies and procedures. Continue HBOT as ordered. Yes Electronic Signature(s) Signed: 01/12/2019 5:53:37 PM By: Linton Ham MD Previous Signature: 01/12/2019 4:34:42 PM Version By: Mikeal Hawthorne EMT/HBOT Entered By: Linton Ham on 01/12/2019 17:49:11 -------------------------------------------------------------------------------- HBO Safety Checklist Details Patient Name: Date of Service: [name], [name] 01/12/2019 8:00 AM Medical Record [id number] Patient Account Number: [id number] Date of Birth/Sex: Treating RN: [birth year] (58 y.o. M) Primary Care Pearle Wandler: [name] Other Clinician: Mikeal Hawthorne Referring Benicia Bergevin: Treating Christine Schiefelbein/Extender:Robson, [name], Alyson Locket in Treatment: 12 HBO Safety Checklist Items Safety Checklist Consent Form Signed Patient voided / foley secured and emptied When did you last eato 0700 - coffee Last dose of injectable or oral agent metformin NA Ostomy pouch emptied and vented if applicable NA All implantable devices assessed, documented and approved NA Intravenous access site secured and place Valuables secured Linens and cotton and cotton/polyester blend (less than 51% polyester) Personal oil-based products / skin lotions / body lotions removed NA Wigs or hairpieces removed NA Smoking or tobacco materials removed Books / newspapers / magazines / loose paper removed Cologne, aftershave, perfume and deodorant removed Jewelry removed (may wrap wedding band) NA Make-up removed Hair care products removed NA Battery operated devices (external) removed NA Heating patches and chemical warmers removed NA Titanium eyewear removed NA Nail polish cured greater than 10 hours NA Casting material cured greater than 10 hours NA Hearing aids removed Loose dentures or partials removed NA Prosthetics have been removed Patient demonstrates correct use of air break device (if  applicable) Patient concerns have been addressed Patient grounding bracelet on and cord attached to chamber Specifics for Inpatients (complete in addition to above) Medication sheet sent with patient Intravenous medications needed or due during therapy sent with patient Drainage tubes (e.g. nasogastric tube or chest tube secured and vented) Endotracheal or Tracheotomy tube secured Cuff deflated of air and inflated with saline Airway  suctioned Electronic Signature(s) Signed: 01/12/2019 8:19:30 AM By: Mikeal Hawthorne EMT/HBOT Entered By: Mikeal Hawthorne on 01/12/2019 08:19:30

## 2019-01-12 NOTE — Progress Notes (Signed)
BRAY, PARCHMAN ([id number]) Visit Report for 01/12/2019 SuperBill Details Patient Name: Date of Service: [name], [name] 01/12/2019 Medical Record [id number] Patient Account Number: [id number] Date of Birth/Sex: Treating RN: [birth year] (58 y.o. M) Primary Care Provider: [name] Other Clinician: Mikeal Hawthorne Referring Provider: Treating Provider/Extender:Robson, [name], Alyson Locket in Treatment: 12 Diagnosis Coding ICD-10 Codes Code Description N30.41 Irradiation cystitis with hematuria Z85.46 Personal history of malignant neoplasm of prostate Facility Procedures CPT4 Code Description Modifier Quantity [id number] G0277-(Facility Use Only) HBOT, full body chamber, 76min 4 Physician Procedures CPT4 Code Description Modifier Quantity [id number] - WC PHYS HYPERBARIC OXYGEN THERAPY 1 ICD-10 Diagnosis Description N30.41 Irradiation cystitis with hematuria Electronic Signature(s) Signed: 01/12/2019 4:34:42 PM By: Mikeal Hawthorne EMT/HBOT Signed: 01/12/2019 5:53:37 PM By: Linton Ham MD Entered By: Mikeal Hawthorne on 01/12/2019 11:03:39

## 2019-01-12 NOTE — Progress Notes (Signed)
ERRIK, BUCHANON ([id number]) Visit Report for 01/11/2019 Arrival Information Details Patient Name: Date of Service: [name], [name] 01/11/2019 8:00 AM Medical Record [id number] Patient Account Number: [id number] Date of Birth/Sex: Treating RN: 02/13/1960 (58 y.o. M) Primary Care Rashada Klontz: [name] Other Clinician: Mikeal Hawthorne Referring Davonta Stroot: Treating Shekera Beavers/Extender:Robson, [name], Alyson Locket in Treatment: 11 Visit Information History Since Last Visit Added or deleted any medications: No Patient Arrived: Ambulatory Any new allergies or adverse reactions: No Arrival Time: 07:45 Had a fall or experienced change in No Accompanied By: self activities of daily living that may affect Transfer Assistance: None risk of falls: Patient Identification Verified: Yes Signs or symptoms of abuse/neglect since last No Secondary Verification Process Yes visito Completed: Hospitalized since last visit: No Patient Requires Transmission-Based No Implantable device outside of the clinic excluding No Precautions: cellular tissue based products placed in the center Patient Has Alerts: No since last visit: Pain Present Now: No Electronic Signature(s) Signed: 01/12/2019 4:34:42 PM By: Mikeal Hawthorne EMT/HBOT Entered By: Mikeal Hawthorne on 01/11/2019 08:10:05 -------------------------------------------------------------------------------- Encounter Discharge Information Details Patient Name: Date of Service: [name], [name] 01/11/2019 8:00 AM Medical Record [id number] Patient Account Number: [id number] Date of Birth/Sex: Treating RN: Jun 03, 1960 (58 y.o. M) Primary Care Kaysan Peixoto: [name] Other Clinician: Mikeal Hawthorne Referring Nadina Fomby: Treating Kristianne Albin/Extender:Robson, [name], Alyson Locket in Treatment: 11 Encounter Discharge Information Items Discharge Condition: Stable Ambulatory Status: Ambulatory Discharge Destination: Home Transportation:  Private Auto Accompanied By: self Schedule Follow-up Appointment: Yes Clinical Summary of Care: Patient Declined Electronic Signature(s) Signed: 01/12/2019 4:34:42 PM By: Mikeal Hawthorne EMT/HBOT Entered By: Mikeal Hawthorne on 01/11/2019 11:04:11 -------------------------------------------------------------------------------- Patient/Caregiver Education Details Patient Name: Date of Service: [name], [name] 12/7/2020andnbsp8:00 AM Medical Record [id number] Patient Account Number: [id number] Date of Birth/Gender: [birth year] (58 y.o. M) Treating RN: Primary Care Physician: [name] Other Clinician: Mikeal Hawthorne Referring Physician: Treating Physician/Extender:Robson, [name], Alyson Locket in Treatment: 11 Education Assessment Education Provided To: Patient Education Topics Provided Hyperbaric Oxygenation: Methods: Explain/Verbal Responses: State content correctly Electronic Signature(s) Signed: 01/12/2019 4:34:42 PM By: Mikeal Hawthorne EMT/HBOT Entered By: Mikeal Hawthorne on 01/11/2019 11:03:54 -------------------------------------------------------------------------------- Vitals Details Patient Name: Date of Service: [name], [name] 01/11/2019 8:00 AM Medical Record [id number] Patient Account Number: [id number] Date of Birth/Sex: Treating RN: 1960/09/16 (58 y.o. M) Primary Care Damarious Holtsclaw: [name] Other Clinician: Mikeal Hawthorne Referring Vernelle Wisner: Treating Statia Burdick/Extender:Robson, [name], Alyson Locket in Treatment: 11 Vital Signs Time Taken: 07:50 Temperature (F): 98.1 Height (in): 70 Pulse (bpm): 62 Weight (lbs): 255 Respiratory Rate (breaths/min): 17 Body Mass Index (BMI): 36.6 Blood Pressure (mmHg): 135/80 Capillary Blood Glucose (mg/dl): 153 Reference Range: 80 - 120 mg / dl Electronic Signature(s) Signed: 01/12/2019 4:34:42 PM By: Mikeal Hawthorne EMT/HBOT Entered By: Mikeal Hawthorne on 01/11/2019 08:10:24

## 2019-01-14 ENCOUNTER — Encounter (HOSPITAL_BASED_OUTPATIENT_CLINIC_OR_DEPARTMENT_OTHER): Payer: Medicare HMO | Admitting: Internal Medicine

## 2019-01-14 ENCOUNTER — Other Ambulatory Visit: Payer: Self-pay

## 2019-01-14 DIAGNOSIS — N3041 Irradiation cystitis with hematuria: Secondary | ICD-10-CM | POA: Diagnosis not present

## 2019-01-14 LAB — GLUCOSE, CAPILLARY
Glucose-Capillary: 129 mg/dL — ABNORMAL HIGH (ref 70–99)
Glucose-Capillary: 94 mg/dL (ref 70–99)

## 2019-01-14 NOTE — Progress Notes (Signed)
JERRION, ARANA ([id number]) Visit Report for 01/14/2019 SuperBill Details Patient Name: Date of Service: KEIYON, LARE 01/14/2019 Medical Record [id number] Patient Account Number: [id number] Date of Birth/Sex: Treating RN: 1960/08/25 (58 y.o. M) Primary Care Provider: [name] Other Clinician: Mikeal Hawthorne Referring Provider: Treating Provider/Extender:Robson, [name], Alyson Locket in Treatment: 12 Diagnosis Coding ICD-10 Codes Code Description N30.41 Irradiation cystitis with hematuria Z85.46 Personal history of malignant neoplasm of prostate Facility Procedures CPT4 Code Description Modifier Quantity [id number] G0277-(Facility Use Only) HBOT, full body chamber, 42min 4 Physician Procedures CPT4 Code Description Modifier Quantity [id number] - WC PHYS HYPERBARIC OXYGEN THERAPY 1 ICD-10 Diagnosis Description 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

## 2019-01-14 NOTE — Progress Notes (Addendum)
[name], [name] ([id number]) Visit Report for 01/14/2019 HBO Details Patient Name: Date of Service: [name], [name] 01/14/2019 8:00 AM Medical Record [id number] Patient Account Number: [id number] Date of Birth/Sex: Treating RN: [birth year] (58 y.o. M) Primary Care Dallas Torok: [name] Other Clinician: Mikeal Hawthorne Referring Senai Kingsley: Treating Shanell Aden/Extender:Robson, [name], Alyson Locket in Treatment: 12 HBO Treatment Course Details Treatment Course Number: 1 Ordering Brean Carberry: Linton Ham Total Treatments Ordered: 40 HBO Treatment Start Date: 11/16/2018 HBO Indication: Late Effect of Radiation HBO Treatment Details Treatment Number: 32 Patient Type: Outpatient Chamber Type: Monoplace Chamber Serial #: [id number] Treatment Protocol: 2.5 ATA with 90 minutes oxygen, with two 5 minute air breaks Treatment Details Compression Rate Down: 2.0 psi / minute De-Compression Rate Up: 2.0 psi / minute Air breaks and CompressTx Pressure breathing periods DecompressDecompress Begins Reached (leave unused spaces Begins Ends blank) Chamber Pressure (ATA)1 2.5 2.5 2.5 2.5 2.5 --2.5 1 Clock Time (24 hr) 08:02 08:14 [id number] 10:06 Treatment Length: 124 (minutes) Treatment Segments: 4 Vital Signs Capillary Blood Glucose Reference Range: 80 - 120 mg / dl HBO Diabetic Blood Glucose Intervention Range: <131 mg/dl or >249 mg/dl Time Vitals Blood Respiratory Capillary Blood Glucose Pulse Action Type: Pulse: Temperature: Taken: Pressure: Rate: Glucose (mg/dl): Meter #: Oximetry (%) Taken: Pre 08:00 161/93 61 16 97.5 130 Post 10:10 138/82 62 14 97.9 94 Treatment Response Treatment Toleration: Well Treatment Completion Treatment Completed without Adverse Event Status: Tija Biss Notes No concerns with treatment given Physician HBO Attestation: I certify that I supervised this HBO treatment in accordance with Medicare guidelines. A  trained Yes Yes emergency response team is readily available per hospital policies and procedures. Continue HBOT as ordered. Yes Electronic Signature(s) Signed: 01/14/2019 4:44:35 PM By: Linton Ham MD Previous Signature: 01/14/2019 3:39:48 PM Version By: Mikeal Hawthorne EMT/HBOT Entered By: Linton Ham on 01/14/2019 16:13:32 -------------------------------------------------------------------------------- HBO Safety Checklist Details Patient Name: Date of Service: [name], [name] 01/14/2019 8:00 AM Medical Record [id number] Patient Account Number: [id number] Date of Birth/Sex: Treating RN: 03-11-1960 (58 y.o. M) Primary Care Catina Nuss: [name] Other Clinician: Mikeal Hawthorne Referring Tava Peery: Treating Jaquarius Seder/Extender:Robson, [name], Alyson Locket in Treatment: 12 HBO Safety Checklist Items Safety Checklist Consent Form Signed Patient voided / foley secured and emptied When did you last eato 0700 - coffee Last dose of injectable or oral agent metformin NA Ostomy pouch emptied and vented if applicable NA All implantable devices assessed, documented and approved NA Intravenous access site secured and place Valuables secured Linens and cotton and cotton/polyester blend (less than 51% polyester) Personal oil-based products / skin lotions / body lotions removed NA Wigs or hairpieces removed NA Smoking or tobacco materials removed Books / newspapers / magazines / loose paper removed Cologne, aftershave, perfume and deodorant removed Jewelry removed (may wrap wedding band) NA Make-up removed Hair care products removed NA Battery operated devices (external) removed NA Heating patches and chemical warmers removed NA Titanium eyewear removed NA Nail polish cured greater than 10 hours NA Casting material cured greater than 10 hours NA Hearing aids removed Loose dentures or partials removed NA Prosthetics have been removed Patient demonstrates correct use  of air break device (if applicable) Patient concerns have been addressed Patient grounding bracelet on and cord attached to chamber Specifics for Inpatients (complete in addition to above) Medication sheet sent with patient Intravenous medications needed or due during therapy sent with patient Drainage tubes (e.g. nasogastric tube or chest tube secured and vented) Endotracheal or Tracheotomy tube secured Cuff deflated of air and inflated with saline Airway  suctioned Electronic Signature(s) Signed: 01/14/2019 8:13:59 AM By: Mikeal Hawthorne EMT/HBOT Entered By: Mikeal Hawthorne on 01/14/2019 08:13:59

## 2019-01-14 NOTE — Progress Notes (Signed)
LAMONDRE, HARVIN ([id number]) Visit Report for 01/14/2019 Arrival Information Details Patient Name: Date of Service: WESTER, TERREL 01/14/2019 8:00 AM Medical Record [id number] Patient Account Number: [id number] Date of Birth/Sex: Treating RN: 09-18-1960 (58 y.o. M) Primary Care Ceriah Kohler: [name] Other Clinician: Mikeal Hawthorne Referring Latressa Harries: Treating Kyisha Fowle/Extender:Robson, [name], Alyson Locket in Treatment: 12 Visit Information History Since Last Visit Added or deleted any medications: No Patient Arrived: Ambulatory Any new allergies or adverse reactions: No Arrival Time: 07:55 Had a fall or experienced change in No Accompanied By: self activities of daily living that may affect Transfer Assistance: None risk of falls: Patient Identification Verified: Yes Signs or symptoms of abuse/neglect since last No Secondary Verification Process Yes visito Completed: Hospitalized since last visit: No Patient Requires Transmission-Based No Implantable device outside of the clinic excluding No Precautions: cellular tissue based products placed in the center Patient Has Alerts: No since last visit: Pain Present Now: No Electronic Signature(s) Signed: 01/14/2019 3:39:48 PM By: Mikeal Hawthorne EMT/HBOT Entered By: Mikeal Hawthorne on 01/14/2019 08:12:53 -------------------------------------------------------------------------------- Encounter Discharge Information Details Patient Name: Date of Service: AVONTAY, KREINBRINK 01/14/2019 8:00 AM Medical Record [id number] Patient Account Number: [id number] Date of Birth/Sex: Treating RN: 01-17-1961 (58 y.o. M) Primary Care Arleth Mccullar: [name] Other Clinician: Mikeal Hawthorne Referring Vernica Wachtel: Treating Shantasia Hunnell/Extender:Robson, [name], Alyson Locket in Treatment: 12 Encounter Discharge Information Items Discharge Condition: Stable Ambulatory Status: Ambulatory Discharge Destination: Home Transportation:  Private Auto Accompanied By: self Schedule Follow-up Appointment: Yes Clinical Summary of Care: Patient Declined Electronic Signature(s) Signed: 01/14/2019 3:39:48 PM By: Mikeal Hawthorne EMT/HBOT Entered By: Mikeal Hawthorne on 01/14/2019 10:57:45 -------------------------------------------------------------------------------- Patient/Caregiver Education Details Patient Name: [name] 12/10/2020andnbsp8:00 Date of Service: AM Medical Record [id number] Number: Patient Account Number: [id number] Treating RN: 06-24-1960 (58 y.o. Date of Birth/Gender: M) Other Clinician: Mikeal Hawthorne Primary Care Physician: [name] Treating Linton Ham Referring Physician: Physician/Extender: Donnajean Lopes in Treatment: 12 Education Assessment Education Provided To: Patient Education Topics Provided Hyperbaric Oxygenation: Methods: Explain/Verbal Responses: State content correctly Electronic Signature(s) Signed: 01/14/2019 3:39:48 PM By: Mikeal Hawthorne EMT/HBOT Entered By: Mikeal Hawthorne on 01/14/2019 10:57:30 -------------------------------------------------------------------------------- Vitals Details Patient Name: Date of Service: LACHLANN, INSELMAN 01/14/2019 8:00 AM Medical Record [id number] Patient Account Number: [id number] Date of Birth/Sex: Treating RN: 01-13-1961 (58 y.o. M) Primary Care Shaima Sardinas: [name] Other Clinician: Mikeal Hawthorne Referring Ceanna Wareing: Treating Bryton Waight/Extender:Robson, [name], Alyson Locket in Treatment: 12 Vital Signs Time Taken: 08:00 Temperature (F): 97.5 Height (in): 70 Pulse (bpm): 61 Weight (lbs): 255 Respiratory Rate (breaths/min): 16 Body Mass Index (BMI): 36.6 Blood Pressure (mmHg): 161/93 Capillary Blood Glucose (mg/dl): 130 Reference Range: 80 - 120 mg / dl Electronic Signature(s) Signed: 01/14/2019 3:39:48 PM By: Mikeal Hawthorne EMT/HBOT Entered By: Mikeal Hawthorne on 01/14/2019 08:13:12

## 2019-01-15 ENCOUNTER — Encounter (HOSPITAL_BASED_OUTPATIENT_CLINIC_OR_DEPARTMENT_OTHER): Payer: Medicare HMO | Admitting: Internal Medicine

## 2019-01-15 DIAGNOSIS — N3041 Irradiation cystitis with hematuria: Secondary | ICD-10-CM | POA: Diagnosis not present

## 2019-01-15 LAB — GLUCOSE, CAPILLARY
Glucose-Capillary: 140 mg/dL — ABNORMAL HIGH (ref 70–99)
Glucose-Capillary: 120 mg/dL — ABNORMAL HIGH (ref 70–99)

## 2019-01-15 NOTE — Progress Notes (Addendum)
[name], [name] ([id number]) Visit Report for 01/15/2019 HBO Details Patient Name: Date of Service: [name], [name] 01/15/2019 8:00 AM Medical Record [id number] Patient Account Number: [id number] Date of Birth/Sex: Treating RN: 1960-05-14 (58 y.o. M) Primary Care [name]: [name] Other Clinician: Mikeal [name] Referring [name]: Treating [name]/Extender:[name], [name], [name] in Treatment: 12 HBO Treatment Course Details Treatment Course Number: 1 Ordering [name]: [name] Total Treatments Ordered: 40 HBO Treatment Start Date: 11/16/2018 HBO Indication: Late Effect of Radiation HBO Treatment Details Treatment Number: 33 Patient Type: Outpatient Chamber Type: Monoplace Chamber Serial #: [id number] Treatment Protocol: 2.5 ATA with 90 minutes oxygen, with two 5 minute air breaks Treatment Details Compression Rate Down: 2.0 psi / minute De-Compression Rate Up: 2.0 psi / minute Air breaks and CompressTx Pressure breathing periods DecompressDecompress Begins Reached (leave unused spaces Begins Ends blank) Chamber Pressure (ATA)1 2.5 2.5 2.5 2.5 2.5 --2.5 1 Clock Time (24 hr) 08:00 08:12 [id number] 10:04 Treatment Length: 124 (minutes) Treatment Segments: 4 Vital Signs Capillary Blood Glucose Reference Range: 80 - 120 mg / dl HBO Diabetic Blood Glucose Intervention Range: <131 mg/dl or >249 mg/dl Time Vitals Blood Respiratory Capillary Blood Glucose Pulse Action Type: Pulse: Temperature: Taken: Pressure: Rate: Glucose (mg/dl): Meter #: Oximetry (%) Taken: Pre 07:55 143/91 67 15 98.4 140 Post 10:07 157/94 61 17 98 120 Treatment Response Treatment Toleration: Well Treatment Completion Treatment Completed without Adverse Event Status: [name] Notes No concerns with treatment given Physician HBO Attestation: I certify that I supervised this HBO treatment in accordance with Medicare guidelines. A  trained Yes Yes emergency response team is readily available per hospital policies and procedures. Continue HBOT as ordered. Yes Electronic Signature(s) Signed: 01/15/2019 6:34:01 PM By: [name] Ham MD Previous Signature: 01/15/2019 3:59:32 PM Version By: [name] EMT/HBOT Entered By: [name] on 01/15/2019 18:30:38 -------------------------------------------------------------------------------- HBO Safety Checklist Details Patient Name: Date of Service: [name] 01/15/2019 8:00 AM Medical Record [id number] Patient Account Number: [id number] Date of Birth/Sex: Treating RN: September 09, 1960 (58 y.o. M) Primary Care [name]: [name] Other Clinician: Mikeal [name] Referring [name]: Treating [name]/Extender:[name], [name], [name] in Treatment: 12 HBO Safety Checklist Items Safety Checklist Consent Form Signed Patient voided / foley secured and emptied When did you last eato 0700 - coffee Last dose of injectable or oral agent metformin NA Ostomy pouch emptied and vented if applicable NA All implantable devices assessed, documented and approved NA Intravenous access site secured and place Valuables secured Linens and cotton and cotton/polyester blend (less than 51% polyester) Personal oil-based products / skin lotions / body lotions removed NA Wigs or hairpieces removed NA Smoking or tobacco materials removed Books / newspapers / magazines / loose paper removed Cologne, aftershave, perfume and deodorant removed Jewelry removed (may wrap wedding band) NA Make-up removed Hair care products removed NA Battery operated devices (external) removed NA Heating patches and chemical warmers removed NA Titanium eyewear removed NA Nail polish cured greater than 10 hours NA Casting material cured greater than 10 hours NA Hearing aids removed Loose dentures or partials removed NA Prosthetics have been removed Patient demonstrates correct use  of air break device (if applicable) Patient concerns have been addressed Patient grounding bracelet on and cord attached to chamber Specifics for Inpatients (complete in addition to above) Medication sheet sent with patient Intravenous medications needed or due during therapy sent with patient Drainage tubes (e.g. nasogastric tube or chest tube secured and vented) Endotracheal or Tracheotomy tube secured Cuff deflated of air and inflated with saline Airway  suctioned Electronic Signature(s) Signed: 01/15/2019 8:27:59 AM By: [name] EMT/HBOT Entered By: [name] on 01/15/2019 08:27:58

## 2019-01-15 NOTE — Progress Notes (Signed)
HALO, WILTFONG ([id number]) Visit Report for 01/15/2019 SuperBill Details Patient Name: Date of Service: [name], [name] 01/15/2019 Medical Record [id number] Patient Account Number: [id number] Date of Birth/Sex: Treating RN: [birth year] (58 y.o. M) Primary Care Provider: [name] Other Clinician: Mikeal Hawthorne Referring Provider: Treating Provider/Extender:Robson, [name], Alyson Locket in Treatment: 12 Diagnosis Coding ICD-10 Codes Code Description N30.41 Irradiation cystitis with hematuria Z85.46 Personal history of malignant neoplasm of prostate Facility Procedures CPT4 Code Description Modifier Quantity [id number] G0277-(Facility Use Only) HBOT, full body chamber, 60min 4 Physician Procedures CPT4 Code Description Modifier Quantity [id number] - WC PHYS HYPERBARIC OXYGEN THERAPY 1 ICD-10 Diagnosis Description N30.41 Irradiation cystitis with hematuria Electronic Signature(s) Signed: 01/15/2019 3:59:32 PM By: Mikeal Hawthorne EMT/HBOT Signed: 01/15/2019 6:34:01 PM By: Linton Ham MD Entered By: Mikeal Hawthorne on 01/15/2019 10:17:23

## 2019-01-15 NOTE — Progress Notes (Signed)
[name] ([id number]) Visit Report for 01/15/2019 Arrival Information Details Patient Name: Date of Service: [name], [name] 01/15/2019 8:00 AM Medical Record [id number] Patient Account Number: [id number] Date of Birth/Sex: Treating RN: [birth year] (58 y.o. M) Primary Care Garo Heidelberg: [name] Other Clinician: Mikeal Hawthorne Referring Cadden Elizondo: Treating Donn Wilmot/Extender:Robson, [name], Alyson Locket in Treatment: 12 Visit Information History Since Last Visit Added or deleted any medications: No Patient Arrived: Ambulatory Any new allergies or adverse reactions: No Arrival Time: 07:50 Had a fall or experienced change in No Accompanied By: self activities of daily living that may affect Transfer Assistance: None risk of falls: Patient Identification Verified: Yes Signs or symptoms of abuse/neglect since last No Secondary Verification Process Yes visito Completed: Hospitalized since last visit: No Patient Requires Transmission-Based No Implantable device outside of the clinic excluding No Precautions: cellular tissue based products placed in the center Patient Has Alerts: No since last visit: Pain Present Now: No Electronic Signature(s) Signed: 01/15/2019 3:59:32 PM By: Mikeal Hawthorne EMT/HBOT Entered By: Mikeal Hawthorne on 01/15/2019 08:26:59 -------------------------------------------------------------------------------- Encounter Discharge Information Details Patient Name: Date of Service: [name], [name] 01/15/2019 8:00 AM Medical Record [id number] Patient Account Number: [id number] Date of Birth/Sex: Treating RN: 11/06/1960 (58 y.o. M) Primary Care Jull Harral: [name] Other Clinician: Mikeal Hawthorne Referring Tashianna Broome: Treating Devona Holmes/Extender:Robson, [name], Alyson Locket in Treatment: 12 Encounter Discharge Information Items Discharge Condition: Stable Ambulatory Status: Ambulatory Discharge Destination: Home Transportation:  Private Auto Accompanied By: self Schedule Follow-up Appointment: Yes Clinical Summary of Care: Patient Declined Electronic Signature(s) Signed: 01/15/2019 3:59:32 PM By: Mikeal Hawthorne EMT/HBOT Entered By: Mikeal Hawthorne on 01/15/2019 10:17:48 -------------------------------------------------------------------------------- Patient/Caregiver Education Details Patient Name: [name], [name] 12/11/2020andnbsp8:00 Date of Service: AM Medical Record [id number] Number: Patient Account Number: [id number] Treating RN: Jul 02, 1960 (58 y.o. Date of Birth/Gender: M) Other Clinician: Mikeal Hawthorne Primary Care Physician: [name] Treating Linton Ham Referring Physician: Physician/Extender: Donnajean Lopes in Treatment: 12 Education Assessment Education Provided To: Patient Education Topics Provided Hyperbaric Oxygenation: Methods: Explain/Verbal Responses: State content correctly Electronic Signature(s) Signed: 01/15/2019 3:59:32 PM By: Mikeal Hawthorne EMT/HBOT Entered By: Mikeal Hawthorne on 01/15/2019 10:17:35 -------------------------------------------------------------------------------- Vitals Details Patient Name: Date of Service: [name], [name] 01/15/2019 8:00 AM Medical Record [id number] Patient Account Number: [id number] Date of Birth/Sex: Treating RN: Dec 23, 1960 (58 y.o. M) Primary Care Drago Hammonds: [name] Other Clinician: Mikeal Hawthorne Referring Zabella Wease: Treating Kristian Hazzard/Extender:Robson, [name], Alyson Locket in Treatment: 12 Vital Signs Time Taken: 07:55 Temperature (F): 98.4 Height (in): 70 Pulse (bpm): 67 Weight (lbs): 255 Respiratory Rate (breaths/min): 15 Body Mass Index (BMI): 36.6 Blood Pressure (mmHg): 143/91 Capillary Blood Glucose (mg/dl): 140 Reference Range: 80 - 120 mg / dl Electronic Signature(s) Signed: 01/15/2019 3:59:32 PM By: Mikeal Hawthorne EMT/HBOT Entered By: Mikeal Hawthorne on 01/15/2019 08:27:15

## 2019-01-18 ENCOUNTER — Other Ambulatory Visit: Payer: Self-pay

## 2019-01-18 ENCOUNTER — Encounter (HOSPITAL_BASED_OUTPATIENT_CLINIC_OR_DEPARTMENT_OTHER): Payer: Medicare HMO | Admitting: Internal Medicine

## 2019-01-18 DIAGNOSIS — N3041 Irradiation cystitis with hematuria: Secondary | ICD-10-CM | POA: Diagnosis not present

## 2019-01-18 LAB — GLUCOSE, CAPILLARY
Glucose-Capillary: 118 mg/dL — ABNORMAL HIGH (ref 70–99)
Glucose-Capillary: 121 mg/dL — ABNORMAL HIGH (ref 70–99)
Glucose-Capillary: 134 mg/dL — ABNORMAL HIGH (ref 70–99)
Glucose-Capillary: 98 mg/dL (ref 70–99)

## 2019-01-18 NOTE — Progress Notes (Signed)
[name], [name] ([id number]) Visit Report for 01/18/2019 Arrival Information Details Patient Name: Date of Service: [name], [name] 01/18/2019 8:00 AM Medical Record [id number] Patient Account Number: [id number] Date of Birth/Sex: Treating RN: [birth year] (58 y.o. M) Primary Care Tayvion Lauder: [name] Other Clinician: Mikeal Hawthorne Referring Fabienne Nolasco: Treating Pocahontas Cohenour/Extender:Robson, [name], Alyson Locket in Treatment: 12 Visit Information History Since Last Visit Added or deleted any medications: No Patient Arrived: Ambulatory Any new allergies or adverse reactions: No Arrival Time: 07:50 Had a fall or experienced change in No Accompanied By: self activities of daily living that may affect Transfer Assistance: None risk of falls: Patient Identification Verified: Yes Signs or symptoms of abuse/neglect since last No Secondary Verification Process Yes visito Completed: Hospitalized since last visit: No Patient Requires Transmission-Based No Implantable device outside of the clinic excluding No Precautions: cellular tissue based products placed in the center Patient Has Alerts: No since last visit: Pain Present Now: No Electronic Signature(s) Signed: 01/18/2019 3:44:56 PM By: Mikeal Hawthorne EMT/HBOT Entered By: Mikeal Hawthorne on 01/18/2019 08:21:26 -------------------------------------------------------------------------------- Encounter Discharge Information Details Patient Name: Date of Service: [name], [name] 01/18/2019 8:00 AM Medical Record [id number] Patient Account Number: [id number] Date of Birth/Sex: Treating RN: 1960/05/22 (58 y.o. M) Primary Care Laurens Matheny: [name] Other Clinician: Mikeal Hawthorne Referring Sten Dematteo: Treating Renel Ende/Extender:Robson, [name], Alyson Locket in Treatment: 12 Encounter Discharge Information Items Discharge Condition: Stable Ambulatory Status: Ambulatory Discharge Destination: Home Transportation:  Private Auto Accompanied By: self Schedule Follow-up Appointment: Yes Clinical Summary of Care: Patient Declined Electronic Signature(s) Signed: 01/18/2019 3:44:56 PM By: Mikeal Hawthorne EMT/HBOT Entered By: Mikeal Hawthorne on 01/18/2019 10:57:09 -------------------------------------------------------------------------------- Patient/Caregiver Education Details Patient Name: [name], [name] 12/14/2020andnbsp8:00 Date of Service: AM Medical Record [id number] Number: Patient Account Number: [id number] Treating RN: 1960-08-05 (58 y.o. Date of Birth/Gender: M) Other Clinician: Mikeal Hawthorne Primary Care Physician: [name] Treating Linton Ham Referring Physician: Physician/Extender: Donnajean Lopes in Treatment: 12 Education Assessment Education Provided To: Patient Education Topics Provided Hyperbaric Oxygenation: Methods: Explain/Verbal Responses: State content correctly Electronic Signature(s) Signed: 01/18/2019 3:44:56 PM By: Mikeal Hawthorne EMT/HBOT Entered By: Mikeal Hawthorne on 01/18/2019 10:56:56 -------------------------------------------------------------------------------- Vitals Details Patient Name: Date of Service: [name], [name] 01/18/2019 8:00 AM Medical Record [id number] Patient Account Number: [id number] Date of Birth/Sex: Treating RN: 1960-11-10 (58 y.o. M) Primary Care Manvi Guilliams: [name] Other Clinician: Mikeal Hawthorne Referring Myldred Raju: Treating Eleshia Wooley/Extender:Robson, [name], Alyson Locket in Treatment: 12 Vital Signs Time Taken: 07:55 Temperature (F): 97.8 Height (in): 70 Pulse (bpm): 59 Weight (lbs): 255 Respiratory Rate (breaths/min): 16 Body Mass Index (BMI): 36.6 Blood Pressure (mmHg): 178/75 Capillary Blood Glucose (mg/dl): 134 Reference Range: 80 - 120 mg / dl Electronic Signature(s) Signed: 01/18/2019 3:44:56 PM By: Mikeal Hawthorne EMT/HBOT Entered By: Mikeal Hawthorne on 01/18/2019 08:21:41

## 2019-01-18 NOTE — Progress Notes (Addendum)
[name], [name] ([id number]) Visit Report for 01/18/2019 HBO Details Patient Name: Date of Service: [name], [name] 01/18/2019 8:00 AM Medical Record [id number] Patient Account Number: [id number] Date of Birth/Sex: Treating RN: 28-Dec-1960 (58 y.o. M) Primary Care Deoni Cosey: [name] Other Clinician: Mikeal Hawthorne Referring Najir Roop: Treating Symone Cornman/Extender:Robson, [name], Alyson Locket in Treatment: 12 HBO Treatment Course Details Treatment Course Number: 1 Ordering Aleesia Henney: Linton Ham Total Treatments Ordered: 40 HBO Treatment Start Date: 11/16/2018 HBO Indication: Late Effect of Radiation HBO Treatment Details Treatment Number: 34 Patient Type: Outpatient Chamber Type: Monoplace Chamber Serial #: [id number] Treatment Protocol: 2.5 ATA with 90 minutes oxygen, with two 5 minute air breaks Treatment Details Compression Rate Down: 2.0 psi / minute De-Compression Rate Up: 2.0 psi / minute Air breaks and CompressTx Pressure breathing periods DecompressDecompress Begins Reached (leave unused spaces Begins Ends blank) Chamber Pressure (ATA)1 2.5 2.5 2.5 2.5 2.5 --2.5 1 Clock Time (24 hr) 08:17 08:29 [id number] 10:21 Treatment Length: 124 (minutes) Treatment Segments: 4 Vital Signs Capillary Blood Glucose Reference Range: 80 - 120 mg / dl HBO Diabetic Blood Glucose Intervention Range: <131 mg/dl or >249 mg/dl Time Vitals Blood Respiratory Capillary Blood Glucose Pulse Action Type: Pulse: Temperature: Taken: Pressure: Rate: Glucose (mg/dl): Meter #: Oximetry (%) Taken: Pre 07:55 178/75 59 16 97.8 134 Post 10:25 177/99 59 17 98.1 98 Treatment Response Treatment Toleration: Well Treatment Completion Treatment Completed without Adverse Event Status: Chantrell Apsey Notes No concerns or treatment given Physician HBO Attestation: I certify that I supervised this HBO treatment in accordance with Medicare guidelines. A trained Yes Yes emergency  response team is readily available per hospital policies and procedures. Continue HBOT as ordered. Yes Electronic Signature(s) Signed: 01/18/2019 5:56:50 PM By: Linton Ham MD Previous Signature: 01/18/2019 3:44:56 PM Version By: Mikeal Hawthorne EMT/HBOT Entered By: Linton Ham on 01/18/2019 17:52:49 -------------------------------------------------------------------------------- HBO Safety Checklist Details Patient Name: Date of Service: [name], [name] 01/18/2019 8:00 AM Medical Record [id number] Patient Account Number: [id number] Date of Birth/Sex: Treating RN: 18-Dec-1960 (58 y.o. M) Primary Care Mickal Meno: [name] Other Clinician: Mikeal Hawthorne Referring Dyshaun Bonzo: Treating Sherrel Ploch/Extender:Robson, [name], Alyson Locket in Treatment: 12 HBO Safety Checklist Items Safety Checklist Consent Form Signed Patient voided / foley secured and emptied When did you last eato 0700 - coffee Last dose of injectable or oral agent metformin NA Ostomy pouch emptied and vented if applicable NA All implantable devices assessed, documented and approved NA Intravenous access site secured and place Valuables secured Linens and cotton and cotton/polyester blend (less than 51% polyester) Personal oil-based products / skin lotions / body lotions removed NA Wigs or hairpieces removed NA Smoking or tobacco materials removed Books / newspapers / magazines / loose paper removed Cologne, aftershave, perfume and deodorant removed Jewelry removed (may wrap wedding band) NA Make-up removed Hair care products removed NA Battery operated devices (external) removed NA Heating patches and chemical warmers removed NA Titanium eyewear removed NA Nail polish cured greater than 10 hours NA Casting material cured greater than 10 hours NA Hearing aids removed Loose dentures or partials removed NA Prosthetics have been removed Patient demonstrates correct use of air break device (if  applicable) Patient concerns have been addressed Patient grounding bracelet on and cord attached to chamber Specifics for Inpatients (complete in addition to above) Medication sheet sent with patient Intravenous medications needed or due during therapy sent with patient Drainage tubes (e.g. nasogastric tube or chest tube secured and vented) Endotracheal or Tracheotomy tube secured Cuff deflated of air and inflated with saline Airway  suctioned Electronic Signature(s) Signed: 01/18/2019 8:22:35 AM By: Mikeal Hawthorne EMT/HBOT Entered By: Mikeal Hawthorne on 01/18/2019 08:22:34

## 2019-01-18 NOTE — Progress Notes (Signed)
[name], [name] ([id number]) Visit Report for 01/18/2019 SuperBill Details Patient Name: Date of Service: [name], [name] 01/18/2019 Medical Record [id number] Patient Account Number: [id number] Date of Birth/Sex: Treating RN: 08-Apr-1960 (58 y.o. M) Primary Care Provider: [name] Other Clinician: Mikeal Hawthorne Referring Provider: Treating Provider/Extender:Kadie Balestrieri, [name], Alyson Locket in Treatment: 12 Diagnosis Coding ICD-10 Codes Code Description N30.41 Irradiation cystitis with hematuria Z85.46 Personal history of malignant neoplasm of prostate Facility Procedures CPT4 Code Description Modifier Quantity [id number] G0277-(Facility Use Only) HBOT, full body chamber, 61min 4 Physician Procedures CPT4 Code Description Modifier Quantity [id number] - WC PHYS HYPERBARIC OXYGEN THERAPY 1 ICD-10 Diagnosis Description N30.41 Irradiation cystitis with hematuria Electronic Signature(s) Signed: 01/18/2019 3:44:56 PM By: Mikeal Hawthorne EMT/HBOT Signed: 01/18/2019 5:56:50 PM By: Linton Ham MD Entered By: Mikeal Hawthorne on 01/18/2019 10:56:42

## 2019-01-19 ENCOUNTER — Encounter (HOSPITAL_BASED_OUTPATIENT_CLINIC_OR_DEPARTMENT_OTHER): Payer: Medicare HMO | Admitting: Internal Medicine

## 2019-01-19 ENCOUNTER — Other Ambulatory Visit: Payer: Self-pay | Admitting: Urology

## 2019-01-19 ENCOUNTER — Telehealth: Payer: Self-pay | Admitting: Cardiology

## 2019-01-19 DIAGNOSIS — N3041 Irradiation cystitis with hematuria: Secondary | ICD-10-CM | POA: Diagnosis not present

## 2019-01-19 LAB — GLUCOSE, CAPILLARY
Glucose-Capillary: 131 mg/dL — ABNORMAL HIGH (ref 70–99)
Glucose-Capillary: 98 mg/dL (ref 70–99)

## 2019-01-19 NOTE — Progress Notes (Signed)
JUANLUIS, KERO ([id number]) Visit Report for 01/19/2019 SuperBill Details Patient Name: Date of Service: [name], [name] 01/19/2019 Medical Record [id number] Patient Account Number: [id number] Date of Birth/Sex: Treating RN: [birth year] (58 y.o. M) Primary Care Provider: [name] Other Clinician: Mikeal Hawthorne Referring Provider: Treating Provider/Extender:Delaynie Stetzer, [name], Alyson Locket in Treatment: 13 Diagnosis Coding ICD-10 Codes Code Description N30.41 Irradiation cystitis with hematuria Z85.46 Personal history of malignant neoplasm of prostate Facility Procedures CPT4 Code Description Modifier Quantity [id number] G0277-(Facility Use Only) HBOT, full body chamber, 10min 4 Physician Procedures CPT4 Code Description Modifier Quantity [id number] - WC PHYS HYPERBARIC OXYGEN THERAPY 1 ICD-10 Diagnosis Description N30.41 Irradiation cystitis with hematuria Electronic Signature(s) Signed: 01/19/2019 11:07:17 AM By: Mikeal Hawthorne EMT/HBOT Signed: 01/19/2019 5:53:46 PM By: Linton Ham MD Entered By: Mikeal Hawthorne on 01/19/2019 11:02:38

## 2019-01-19 NOTE — Progress Notes (Addendum)
[name], [name] ([id number]) Visit Report for 01/19/2019 HBO Details Patient Name: Date of Service: [name], [name] 01/19/2019 8:00 AM Medical Record [id number] Patient Account Number: [id number] Date of Birth/Sex: Treating RN: 1960/11/02 (58 y.o. M) Primary Care Mardee Clune: [name] Other Clinician: Mikeal Hawthorne Referring Jaislyn Blinn: Treating Herminia Warren/Extender:Robson, [name], Alyson Locket in Treatment: 13 HBO Treatment Course Details Treatment Course Number: 1 Ordering Musa Rewerts: Linton Ham Total Treatments Ordered: 40 HBO Treatment Start Date: 11/16/2018 HBO Indication: Late Effect of Radiation HBO Treatment Details Treatment Number: 35 Patient Type: Outpatient Chamber Type: Monoplace Chamber Serial #: [id number] Treatment Protocol: 2.5 ATA with 90 minutes oxygen, with two 5 minute air breaks Treatment Details Compression Rate Down: 2.0 psi / minute De-Compression Rate Up: 2.0 psi / minute Air breaks and CompressTx Pressure breathing periods DecompressDecompress Begins Reached (leave unused spaces Begins Ends blank) Chamber Pressure (ATA)1 2.5 2.5 2.5 2.5 2.5 --2.5 1 Clock Time (24 hr) 08:12 08:24 [id number] 10:16 Treatment Length: 124 (minutes) Treatment Segments: 4 Vital Signs Capillary Blood Glucose Reference Range: 80 - 120 mg / dl HBO Diabetic Blood Glucose Intervention Range: <131 mg/dl or >249 mg/dl Time Vitals Blood Respiratory Capillary Blood Glucose Pulse Action Type: Pulse: Temperature: Taken: Pressure: Rate: Glucose (mg/dl): Meter #: Oximetry (%) Taken: Pre 07:55 155/89 60 18 97.6 131 Post 10:17 168/91 57 16 98 98 Treatment Response Treatment Toleration: Well Treatment Completion Treatment Completed without Adverse Event Status: Mohan Erven Notes No concerns with treatment given Physician HBO Attestation: I certify that I supervised this HBO treatment in accordance with Medicare guidelines. A trained Yes Yes emergency  response team is readily available per hospital policies and procedures. Continue HBOT as ordered. Yes Electronic Signature(s) Signed: 01/19/2019 5:53:46 PM By: Linton Ham MD Previous Signature: 01/19/2019 11:07:17 AM Version By: Mikeal Hawthorne EMT/HBOT Entered By: Linton Ham on 01/19/2019 17:34:55 -------------------------------------------------------------------------------- HBO Safety Checklist Details Patient Name: Date of Service: [name], [name] 01/19/2019 8:00 AM Medical Record [id number] Patient Account Number: [id number] Date of Birth/Sex: Treating RN: 1960-02-07 (58 y.o. M) Primary Care Michole Lecuyer: [name] Other Clinician: Mikeal Hawthorne Referring Kynleigh Artz: Treating Shalon Councilman/Extender:Robson, [name], Alyson Locket in Treatment: 13 HBO Safety Checklist Items Safety Checklist Consent Form Signed Patient voided / foley secured and emptied When did you last eato 0700 - coffee Last dose of injectable or oral agent metformin NA Ostomy pouch emptied and vented if applicable NA All implantable devices assessed, documented and approved NA Intravenous access site secured and place Valuables secured Linens and cotton and cotton/polyester blend (less than 51% polyester) Personal oil-based products / skin lotions / body lotions removed NA Wigs or hairpieces removed NA Smoking or tobacco materials removed Books / newspapers / magazines / loose paper removed Cologne, aftershave, perfume and deodorant removed Jewelry removed (may wrap wedding band) NA Make-up removed Hair care products removed NA Battery operated devices (external) removed NA Heating patches and chemical warmers removed NA Titanium eyewear removed NA Nail polish cured greater than 10 hours NA Casting material cured greater than 10 hours NA Hearing aids removed Loose dentures or partials removed NA Prosthetics have been removed Patient demonstrates correct use of air break device (if  applicable) Patient concerns have been addressed Patient grounding bracelet on and cord attached to chamber Specifics for Inpatients (complete in addition to above) Medication sheet sent with patient Intravenous medications needed or due during therapy sent with patient Drainage tubes (e.g. nasogastric tube or chest tube secured and vented) Endotracheal or Tracheotomy tube secured Cuff deflated of air and inflated with saline Airway  suctioned Electronic Signature(s) Signed: 01/19/2019 8:32:53 AM By: Mikeal Hawthorne EMT/HBOT Entered By: Mikeal Hawthorne on 01/19/2019 08:32:53

## 2019-01-19 NOTE — Telephone Encounter (Signed)
   [facility] Pre-operative Risk Assessment    Request for surgical clearance:  1. What type of surgery is being performed? Insertion of penial prosthesis   2. When is this surgery scheduled? 02-22-19  3. What type of clearance is required (medical clearance vs. Pharmacy clearance to hold med vs. Both)? Medical  4. Are there any medications that need to be held prior to surgery and how long? Aspirin, 3 days prior  5. Practice name and name of physician performing surgery? Dr. Link Snuffer, [facility]   6. What is your office phone number: [phone] x [phone]    7.   What is your office fax number: [phone]  8.   Anesthesia type (None, local, MAC, general) ? general   [name] 01/19/2019, 12:06 PM  _________________________________________________________________   (provider comments below)

## 2019-01-19 NOTE — Telephone Encounter (Signed)
Dr. Stanford Breed  Can you please comment on holding this patient's ASA therapy for 3 days prior to penile prosthesis insertion scheduled for 02/22/2019?  He has a history of NSTEMI in which he underwent a cardiac catheterization that showed an 85% distal LAD, 80% proximal RCA, 90% mid RCA, occluded circumflex and occluded marginal in which he underwent a PCI of his circumflex and circumflex marginal.  He subsequently had staged PCI to his RCA.  Echocardiogram showed LVEF of 50% with grade 2 DD.  His Brilinta was recently discontinued 09/2018.  You last saw the patient 09/2018 and he was doing well from a cardiac perspective with no significant changes.  Please send your recommendations to the preop pool  Thank you [name]

## 2019-01-19 NOTE — Progress Notes (Signed)
LONAS, CALVER ([id number]) Visit Report for 01/19/2019 Arrival Information Details Patient Name: Date of Service: [name], [name] 01/19/2019 8:00 AM Medical Record [id number] Patient Account Number: [id number] Date of Birth/Sex: Treating RN: [birth year] (58 y.o. M) Primary Care Briann Sarchet: [name] Other Clinician: Mikeal Hawthorne Referring Efosa Treichler: Treating Jayant Kriz/Extender:Robson, [name], Alyson Locket in Treatment: 13 Visit Information History Since Last Visit Added or deleted any medications: No Patient Arrived: Ambulatory Any new allergies or adverse reactions: No Arrival Time: 07:50 Had a fall or experienced change in No Accompanied By: self activities of daily living that may affect Transfer Assistance: None risk of falls: Patient Identification Verified: Yes Signs or symptoms of abuse/neglect since last No Secondary Verification Process Yes visito Completed: Hospitalized since last visit: No Patient Requires Transmission-Based No Implantable device outside of the clinic excluding No Precautions: cellular tissue based products placed in the center Patient Has Alerts: No since last visit: Pain Present Now: No Electronic Signature(s) Signed: 01/19/2019 11:07:17 AM By: Mikeal Hawthorne EMT/HBOT Entered By: Mikeal Hawthorne on 01/19/2019 08:31:50 -------------------------------------------------------------------------------- Encounter Discharge Information Details Patient Name: Date of Service: [name], [name] 01/19/2019 8:00 AM Medical Record [id number] Patient Account Number: [id number] Date of Birth/Sex: Treating RN: 08/20/1960 (58 y.o. M) Primary Care Leeland Lovelady: [name] Other Clinician: Mikeal Hawthorne Referring Jonus Coble: Treating Griselda Tosh/Extender:Robson, [name], Alyson Locket in Treatment: 13 Encounter Discharge Information Items Discharge Condition: Stable Ambulatory Status: Ambulatory Discharge Destination: Home Transportation:  Private Auto Accompanied By: self Schedule Follow-up Appointment: Yes Clinical Summary of Care: Patient Declined Electronic Signature(s) Signed: 01/19/2019 11:07:17 AM By: Mikeal Hawthorne EMT/HBOT Entered By: Mikeal Hawthorne on 01/19/2019 11:03:10 -------------------------------------------------------------------------------- Patient/Caregiver Education Details Patient Name: [name], [name] 12/15/2020andnbsp8:00 Date of Service: AM Medical Record [id number] Number: Patient Account Number: [id number] Treating RN: 04-18-1960 (59 y.o. Date of Birth/Gender: M) Other Clinician: Mikeal Hawthorne Primary Care Physician: [name] Treating Linton Ham Referring Physician: Physician/Extender: Donnajean Lopes in Treatment: 13 Education Assessment Education Provided To: Patient Education Topics Provided Hyperbaric Oxygenation: Methods: Explain/Verbal Responses: State content correctly Electronic Signature(s) Signed: 01/19/2019 11:07:17 AM By: Mikeal Hawthorne EMT/HBOT Entered By: Mikeal Hawthorne on 01/19/2019 11:02:53 -------------------------------------------------------------------------------- Vitals Details Patient Name: Date of Service: [name], [name] 01/19/2019 8:00 AM Medical Record [id number] Patient Account Number: [id number] Date of Birth/Sex: Treating RN: March 28, 1960 (58 y.o. M) Primary Care Keylee Shrestha: [name] Other Clinician: Mikeal Hawthorne Referring Lorenza Shakir: Treating Orion Mole/Extender:Robson, [name], Alyson Locket in Treatment: 13 Vital Signs Time Taken: 07:55 Temperature (F): 97.6 Height (in): 70 Pulse (bpm): 60 Weight (lbs): 255 Respiratory Rate (breaths/min): 18 Body Mass Index (BMI): 36.6 Blood Pressure (mmHg): 155/89 Capillary Blood Glucose (mg/dl): 131 Reference Range: 80 - 120 mg / dl Electronic Signature(s) Signed: 01/19/2019 11:07:17 AM By: Mikeal Hawthorne EMT/HBOT Entered By: Mikeal Hawthorne on 01/19/2019 08:32:14

## 2019-01-20 ENCOUNTER — Other Ambulatory Visit: Payer: Self-pay

## 2019-01-20 ENCOUNTER — Encounter (HOSPITAL_BASED_OUTPATIENT_CLINIC_OR_DEPARTMENT_OTHER): Payer: Medicare HMO | Admitting: Physician Assistant

## 2019-01-20 ENCOUNTER — Telehealth: Payer: Self-pay | Admitting: Cardiology

## 2019-01-20 DIAGNOSIS — N3041 Irradiation cystitis with hematuria: Secondary | ICD-10-CM | POA: Diagnosis not present

## 2019-01-20 LAB — GLUCOSE, CAPILLARY
Glucose-Capillary: 156 mg/dL — ABNORMAL HIGH (ref 70–99)
Glucose-Capillary: 108 mg/dL — ABNORMAL HIGH (ref 70–99)

## 2019-01-20 NOTE — Telephone Encounter (Signed)
    Primary Cardiologist: [name], MD  Chart reviewed as part of pre-operative protocol coverage. [name] was last seen in 09/2018 by Dr. Stanford Breed and was df=oing well from a cardiac perspective with no significant changes however, [name] has a history of NSTEMI in which he underwent a cardiac catheterization that showed an 85% distal LAD, 80% proximal RCA, 90% mid RCA, occluded circumflex and occluded marginal in which he underwent a PCI of his circumflex and circumflex marginal. He subsequently had staged PCI to his RCA. Echocardiogram showed LVEF of 50% with grade 2 DD.His Brilinta was recently discontinued 09/2018. He was continued on ASA.   Per Dr. Stanford Breed, it will NOT be acceptable to hold ASA therapy prior to procedure given more recent multiple PCI coronary interventions as above.   You last saw the patient 09/2018 and he was doing well from a cardiac perspective with no significant changes.  Pre-op covering staff: - Please contact requesting surgeon's office via preferred method (i.e, phone, fax) to inform them of this information. If they need further clearance, please have them contact us.   Kathyrn Drown NP-C [facility]

## 2019-01-20 NOTE — Telephone Encounter (Signed)
Would continue ASA 81 mg daily given h/o PCI. [name]

## 2019-01-20 NOTE — Progress Notes (Signed)
[name], [name] ([id number]) Visit Report for 01/20/2019 Problem List Details Patient Name: Date of Service: [name], [name] 01/20/2019 8:00 AM Medical Record [id number] Patient Account Number: [id number] Date of Birth/Sex: Treating RN: 1960/10/28 (58 y.o. M) Primary Care Provider: [name] Other Clinician: Referring Provider: Treating Provider/Extender:Stone III, [name], [name] Weeks in Treatment: 13 Active Problems ICD-10 Evaluated Encounter Code Description Active Date Today Diagnosis N30.41 Irradiation cystitis with hematuria 10/20/2018 No Yes Z85.46 Personal history of malignant neoplasm of prostate 10/20/2018 No Yes Inactive Problems Resolved Problems Electronic Signature(s) Signed: 01/20/2019 5:33:20 PM By: [name] PA-C Entered By: [name] on 01/20/2019 17:33:20 -------------------------------------------------------------------------------- SuperBill Details Patient Name: Date of Service: [name], [name] 01/20/2019 Medical Record [id number] Patient Account Number: [id number] Date of Birth/Sex: Treating RN: [birth year] (58 y.o. M) Primary Care Provider: [name] Other Clinician: Mikeal Hawthorne Referring Provider: Treating Provider/Extender:Stone III, [name], [name] Weeks in Treatment: 13 Diagnosis Coding ICD-10 Codes Code Description N30.41 Irradiation cystitis with hematuria Z85.46 Personal history of malignant neoplasm of prostate Facility Procedures CPT4 Code Description: [id number] G0277-(Facility Use Only) HBOT, full body chamber, 30min Modifier: Quantity: 4 Physician Procedures CPT4 Code Description: [id number] - WC PHYS HYPERBARIC OXYGEN THERAPY ICD-10 Diagnosis Description N30.41 Irradiation cystitis with hematuria Modifier: Quantity: 1 Electronic Signature(s) Signed: 01/20/2019 5:33:17 PM By: [name] PA-C Previous Signature: 01/20/2019 3:47:19 PM Version By: Mikeal Hawthorne EMT/HBOT Entered By: [name] on 01/20/2019 17:33:17

## 2019-01-20 NOTE — Progress Notes (Signed)
[name], [name] ([id number]) Visit Report for 01/20/2019 Arrival Information Details Patient Name: Date of Service: [name], [name] 01/20/2019 8:00 AM Medical Record [id number] Patient Account Number: [id number] Date of Birth/Sex: Treating RN: 12/18/1960 (58 y.o. M) Primary Care Ardean Melroy: [name] Other Clinician: Mikeal Hawthorne Referring Lilybeth Vien: Treating Ly Wass/Extender:Stone III, [name], [name] Weeks in Treatment: 13 Visit Information History Since Last Visit Added or deleted any medications: No Patient Arrived: Ambulatory Any new allergies or adverse reactions: No Arrival Time: 07:50 Had a fall or experienced change in No Accompanied By: self activities of daily living that may affect Transfer Assistance: None risk of falls: Patient Identification Verified: Yes Signs or symptoms of abuse/neglect since last No Secondary Verification Process Yes visito Completed: Hospitalized since last visit: No Patient Requires Transmission-Based No Implantable device outside of the clinic excluding No Precautions: cellular tissue based products placed in the center Patient Has Alerts: No since last visit: Pain Present Now: No Electronic Signature(s) Signed: 01/20/2019 3:47:19 PM By: Mikeal Hawthorne EMT/HBOT Entered By: Mikeal Hawthorne on 01/20/2019 08:31:26 -------------------------------------------------------------------------------- Encounter Discharge Information Details Patient Name: Date of Service: [name], [name] 01/20/2019 8:00 AM Medical Record [id number] Patient Account Number: [id number] Date of Birth/Sex: Treating RN: July 21, 1960 (58 y.o. M) Primary Care Shamell Hittle: [name] Other Clinician: Mikeal Hawthorne Referring Carlita Whitcomb: Treating Rayanne Padmanabhan/Extender:Stone III, [name], [name] Weeks in Treatment: 57 Encounter Discharge Information Items Discharge Condition: Stable Ambulatory Status: Ambulatory Discharge Destination: Home Transportation:  Private Auto Accompanied By: self Schedule Follow-up Appointment: Yes Clinical Summary of Care: Patient Declined Electronic Signature(s) Signed: 01/20/2019 3:47:19 PM By: Mikeal Hawthorne EMT/HBOT Entered By: Mikeal Hawthorne on 01/20/2019 11:10:44 -------------------------------------------------------------------------------- Patient/Caregiver Education Details Patient Name: [name] 12/16/2020andnbsp8:00 Date of Service: AM Medical Record [id number] Number: Patient Account Number: [id number] Treating RN: 09-21-1960 (58 y.o. Date of Birth/Gender: M) Other Clinician: Mikeal Hawthorne Primary Care Physician: [name] Treating [name] Referring Physician: Physician/Extender: Donnajean Lopes in Treatment: 13 Education Assessment Education Provided To: Patient Education Topics Provided Hyperbaric Oxygenation: Methods: Explain/Verbal Responses: State content correctly Electronic Signature(s) Signed: 01/20/2019 3:47:19 PM By: Mikeal Hawthorne EMT/HBOT Entered By: Mikeal Hawthorne on 01/20/2019 11:10:32 -------------------------------------------------------------------------------- Vitals Details Patient Name: Date of Service: [name], [name] 01/20/2019 8:00 AM Medical Record [id number] Patient Account Number: [id number] Date of Birth/Sex: Treating RN: 1960/09/19 (58 y.o. M) Primary Care Kayly Kriegel: [name] Other Clinician: Mikeal Hawthorne Referring Raiyah Speakman: Treating Nazier Neyhart/Extender:Stone III, [name], [name] Weeks in Treatment: 13 Vital Signs Time Taken: 07:55 Temperature (F): 97.3 Height (in): 70 Pulse (bpm): 65 Weight (lbs): 255 Respiratory Rate (breaths/min): 16 Body Mass Index (BMI): 36.6 Blood Pressure (mmHg): 146/79 Capillary Blood Glucose (mg/dl): 156 Reference Range: 80 - 120 mg / dl Electronic Signature(s) Signed: 01/20/2019 3:47:19 PM By: Mikeal Hawthorne EMT/HBOT Entered By: Mikeal Hawthorne on 01/20/2019 08:31:41

## 2019-01-20 NOTE — Progress Notes (Addendum)
[name], [name] ([id number]) Visit Report for 01/20/2019 HBO Details Patient Name: Date of Service: [name], [name] 01/20/2019 8:00 AM Medical Record [name] Patient Account Number: [id number] Date of Birth/Sex: Treating RN: [birth year] (58 y.o. M) Primary Care [name]: [name] Other Clinician: Mikeal [name] Referring [name]: Treating [name]/Extender:[name], [name], [name] Weeks in Treatment: 13 HBO Treatment Course Details Treatment Course Number: 1 Ordering Jaimee [name]: [name] Total Treatments Ordered: 40 HBO Treatment Start Date: 11/16/2018 HBO Indication: Late Effect of Radiation HBO Treatment Details Treatment Number: 36 Patient Type: Outpatient Chamber Type: Monoplace Chamber Serial #: [id number] Treatment Protocol: 2.5 ATA with 90 minutes oxygen, with two 5 minute air breaks Treatment Details Compression Rate Down: 2.0 psi / minute De-Compression Rate Up: 2.0 psi / minute Air breaks and CompressTx Pressure breathing periods DecompressDecompress Begins Reached (leave unused spaces Begins Ends blank) Chamber Pressure (ATA)1 2.5 2.5 2.5 2.5 2.5 --2.5 1 Clock Time (24 hr) 08:07 08:19 [name] 10:11 Treatment Length: 124 (minutes) Treatment Segments: 4 Vital Signs Capillary Blood Glucose Reference Range: 80 - 120 mg / dl HBO Diabetic Blood Glucose Intervention Range: <131 mg/dl or >249 mg/dl Time Vitals Blood Respiratory Capillary Blood Glucose Pulse Action Type: Pulse: Temperature: Taken: Pressure: Rate: Glucose (mg/dl): Meter #: Oximetry (%) Taken: Pre 07:55 146/79 65 16 97.3 156 Post 10:15 165/95 61 15 97.5 108 Treatment Response Treatment Toleration: Well Treatment Completion Treatment Completed without Adverse Event Status: Physician HBO Attestation: I certify that I supervised this HBO treatment in accordance with Medicare guidelines. A trained Yes emergency response team is readily available per hospital  policies and procedures. Continue HBOT as ordered. Yes Electronic Signature(s) Signed: 01/20/2019 5:33:13 PM By: [name] Keeler PA-C Previous Signature: 01/20/2019 3:47:19 PM Version By: [name] EMT/HBOT Entered By: [name] on 01/20/2019 17:33:13 -------------------------------------------------------------------------------- HBO Safety Checklist Details Patient Name: Date of Service: [name] 01/20/2019 8:00 AM Medical Record [name] Patient Account Number: [id number] Date of Birth/Sex: Treating RN: 11-26-1960 (58 y.o. M) Primary Care [name]: [name] Other Clinician: Mikeal [name] Referring [name]: Treating [name]/Extender:[name], [name], [name] Weeks in Treatment: 13 HBO Safety Checklist Items Safety Checklist Consent Form Signed Patient voided / foley secured and emptied When did you last eato 0700 - coffee Last dose of injectable or oral agent metformin NA Ostomy pouch emptied and vented if applicable NA All implantable devices assessed, documented and approved NA Intravenous access site secured and place Valuables secured Linens and cotton and cotton/polyester blend (less than 51% polyester) Personal oil-based products / skin lotions / body lotions removed NA Wigs or hairpieces removed NA Smoking or tobacco materials removed Books / newspapers / magazines / loose paper removed Cologne, aftershave, perfume and deodorant removed Jewelry removed (may wrap wedding band) NA Make-up removed Hair care products removed NA Battery operated devices (external) removed NA Heating patches and chemical warmers removed NA Titanium eyewear removed NA Nail polish cured greater than 10 hours NA Casting material cured greater than 10 hours NA Hearing aids removed Loose dentures or partials removed NA Prosthetics have been removed Patient demonstrates correct use of air break device (if applicable) Patient concerns have been  addressed Patient grounding bracelet on and cord attached to chamber Specifics for Inpatients (complete in addition to above) Medication sheet sent with patient Intravenous medications needed or due during therapy sent with patient Drainage tubes (e.g. nasogastric tube or chest tube secured and vented) Endotracheal or Tracheotomy tube secured Cuff deflated of air and inflated with saline Airway suctioned Electronic Signature(s) Signed:  01/20/2019 8:32:23 AM By: [name] EMT/HBOT Entered By: [name] on 01/20/2019 08:32:22

## 2019-01-20 NOTE — Telephone Encounter (Signed)
   Primary Cardiologist: [name], MD  Chart reviewed as part of pre-operative protocol coverage. [name] was last seen in 09/2018 by Dr. Stanford Breed and was df=oing well from a cardiac perspective with no significant changes however, [name] has a history of NSTEMI in which he underwent a cardiac catheterization that showed an 85% distal LAD, 80% proximal RCA, 90% mid RCA, occluded circumflex and occluded marginal in which he underwent a PCI of his circumflex and circumflex marginal.  He subsequently had staged PCI to his RCA.  Echocardiogram showed LVEF of 50% with grade 2 DD.  His Brilinta was recently discontinued 09/2018. He was continued on ASA.   Per Dr. Stanford Breed, it will NOT be acceptable to hold ASA therapy prior to procedure given more recent multiple PCI coronary interventions as above.   You last saw the patient 09/2018 and he was doing well from a cardiac perspective with no significant changes.  Pre-op covering staff: - Please contact requesting surgeon's office via preferred method (i.e, phone, fax) to inform them of need for appointment prior to surgery.  Kathyrn Drown, NP 01/20/2019, 8:00 AM

## 2019-01-21 ENCOUNTER — Encounter (HOSPITAL_BASED_OUTPATIENT_CLINIC_OR_DEPARTMENT_OTHER): Payer: Medicare HMO | Admitting: Internal Medicine

## 2019-01-21 DIAGNOSIS — N3041 Irradiation cystitis with hematuria: Secondary | ICD-10-CM | POA: Diagnosis not present

## 2019-01-21 LAB — GLUCOSE, CAPILLARY
Glucose-Capillary: 142 mg/dL — ABNORMAL HIGH (ref 70–99)
Glucose-Capillary: 103 mg/dL — ABNORMAL HIGH (ref 70–99)

## 2019-01-21 NOTE — Progress Notes (Addendum)
KRISTINE, TIGNOR ([id number]) Visit Report for 01/21/2019 HBO Details Patient Name: Date of Service: [name], [name] 01/21/2019 8:00 AM Medical Record [id number] Patient Account Number: [id number] Date of Birth/Sex: Treating RN: 24-Jan-1961 (58 y.o. M) Primary Care Augusto Deckman: [name] Other Clinician: Mikeal Hawthorne Referring Jenniferlynn Saad: Treating Morenike Cuff/Extender:Robson, [name], Alyson Locket in Treatment: 13 HBO Treatment Course Details Treatment Course Number: 1 Ordering Jettson Crable: Linton Ham Total Treatments Ordered: 40 HBO Treatment Start Date: 11/16/2018 HBO Indication: Late Effect of Radiation HBO Treatment Details Treatment Number: 37 Patient Type: Outpatient Chamber Type: Monoplace Chamber Serial #: [id number] Treatment Protocol: 2.5 ATA with 90 minutes oxygen, with two 5 minute air breaks Treatment Details Compression Rate Down: 2.0 psi / minute De-Compression Rate Up: 2.0 psi / minute Air breaks and CompressTx Pressure breathing periods DecompressDecompress Begins Reached (leave unused spaces Begins Ends blank) Chamber Pressure (ATA)1 2.5 2.5 2.5 2.5 2.5 --2.5 1 Clock Time (24 hr) 08:10 08:22 [id number] 10:14 Treatment Length: 124 (minutes) Treatment Segments: 4 Vital Signs Capillary Blood Glucose Reference Range: 80 - 120 mg / dl HBO Diabetic Blood Glucose Intervention Range: <131 mg/dl or >249 mg/dl Time Vitals Blood Respiratory Capillary Blood Glucose Pulse Action Type: Pulse: Temperature: Taken: Pressure: Rate: Glucose (mg/dl): Meter #: Oximetry (%) Taken: Pre 08:00 147/84 52 17 97.8 142 Post 10:17 134/86 59 15 98.2 103 Treatment Response Treatment Toleration: Well Treatment Completion Treatment Completed without Adverse Event Status: Nili Honda Notes No concerns with treatment given Physician HBO Attestation: I certify that I supervised this HBO treatment in accordance with Medicare guidelines. A  trained Yes Yes emergency response team is readily available per hospital policies and procedures. Continue HBOT as ordered. Yes Electronic Signature(s) Signed: 01/21/2019 5:01:00 PM By: Linton Ham MD Previous Signature: 01/21/2019 3:39:50 PM Version By: Mikeal Hawthorne EMT/HBOT Entered By: Linton Ham on 01/21/2019 16:57:03 -------------------------------------------------------------------------------- HBO Safety Checklist Details Patient Name: Date of Service: [name], [name] 01/21/2019 8:00 AM Medical Record [id number] Patient Account Number: [id number] Date of Birth/Sex: Treating RN: [birth year] (58 y.o. M) Primary Care Hensley Treat: [name] Other Clinician: Mikeal Hawthorne Referring Kendrah Lovern: Treating Refael Fulop/Extender:Robson, [name], Alyson Locket in Treatment: 13 HBO Safety Checklist Items Safety Checklist Consent Form Signed Patient voided / foley secured and emptied When did you last eato 0700 - coffee Last dose of injectable or oral agent metformin NA Ostomy pouch emptied and vented if applicable NA All implantable devices assessed, documented and approved NA Intravenous access site secured and place Valuables secured Linens and cotton and cotton/polyester blend (less than 51% polyester) Personal oil-based products / skin lotions / body lotions removed NA Wigs or hairpieces removed NA Smoking or tobacco materials removed Books / newspapers / magazines / loose paper removed Cologne, aftershave, perfume and deodorant removed Jewelry removed (may wrap wedding band) NA Make-up removed Hair care products removed NA Battery operated devices (external) removed NA Heating patches and chemical warmers removed NA Titanium eyewear removed NA Nail polish cured greater than 10 hours NA Casting material cured greater than 10 hours NA Hearing aids removed Loose dentures or partials removed NA Prosthetics have been removed Patient demonstrates correct use  of air break device (if applicable) Patient concerns have been addressed Patient grounding bracelet on and cord attached to chamber Specifics for Inpatients (complete in addition to above) Medication sheet sent with patient Intravenous medications needed or due during therapy sent with patient Drainage tubes (e.g. nasogastric tube or chest tube secured and vented) Endotracheal or Tracheotomy tube secured Cuff deflated of air and inflated with saline Airway  suctioned Electronic Signature(s) Signed: 01/21/2019 8:35:03 AM By: Mikeal Hawthorne EMT/HBOT Entered By: Mikeal Hawthorne on 01/21/2019 08:35:03

## 2019-01-21 NOTE — Progress Notes (Signed)
[name], [name] ([id number]) Visit Report for 01/21/2019 SuperBill Details Patient Name: Date of Service: [name], [name] 01/21/2019 Medical Record [id number] Patient Account Number: [id number] Date of Birth/Sex: Treating RN: 04-Jun-1960 (58 y.o. M) Primary Care Provider: [name] Other Clinician: Mikeal Hawthorne Referring Provider: Treating Provider/Extender:Merilynn Haydu, [name], Alyson Locket in Treatment: 13 Diagnosis Coding ICD-10 Codes Code Description N30.41 Irradiation cystitis with hematuria Z85.46 Personal history of malignant neoplasm of prostate Facility Procedures CPT4 Code Description Modifier Quantity [id number] G0277-(Facility Use Only) HBOT, full body chamber, 27min 4 Physician Procedures CPT4 Code Description Modifier Quantity [id number] - WC PHYS HYPERBARIC OXYGEN THERAPY 1 ICD-10 Diagnosis Description N30.41 Irradiation cystitis with hematuria Electronic Signature(s) Signed: 01/21/2019 3:39:50 PM By: Mikeal Hawthorne EMT/HBOT Signed: 01/21/2019 5:01:00 PM By: Linton Ham MD Entered By: Mikeal Hawthorne on 01/21/2019 11:04:55

## 2019-01-21 NOTE — Progress Notes (Signed)
ALAMIN, SPRUNK ([id number]) Visit Report for 01/21/2019 Arrival Information Details Patient Name: Date of Service: [name], [name] 01/21/2019 8:00 AM Medical Record [id number] Patient Account Number: [id number] Date of Birth/Sex: Treating RN: 12-05-1960 (58 y.o. M) Primary Care Iridian Reader: [name] Other Clinician: Mikeal Hawthorne Referring Zamarah Ullmer: Treating Emslee Lopezmartinez/Extender:Robson, [name], Alyson Locket in Treatment: 13 Visit Information History Since Last Visit Added or deleted any medications: No Patient Arrived: Ambulatory Any new allergies or adverse reactions: No Arrival Time: 07:55 Had a fall or experienced change in No Accompanied By: self activities of daily living that may affect Transfer Assistance: None risk of falls: Patient Identification Verified: Yes Signs or symptoms of abuse/neglect since last No Secondary Verification Process Yes visito Completed: Hospitalized since last visit: No Patient Requires Transmission-Based No Implantable device outside of the clinic excluding No Precautions: cellular tissue based products placed in the center Patient Has Alerts: No since last visit: Pain Present Now: No Electronic Signature(s) Signed: 01/21/2019 3:39:50 PM By: Mikeal Hawthorne EMT/HBOT Entered By: Mikeal Hawthorne on 01/21/2019 08:32:08 -------------------------------------------------------------------------------- Encounter Discharge Information Details Patient Name: Date of Service: [name], [name] 01/21/2019 8:00 AM Medical Record [id number] Patient Account Number: [id number] Date of Birth/Sex: Treating RN: 11-23-1960 (58 y.o. M) Primary Care Mercie Balsley: [name] Other Clinician: Mikeal Hawthorne Referring Ashton Sabine: Treating Kooper Godshall/Extender:Robson, [name], Alyson Locket in Treatment: 13 Encounter Discharge Information Items Discharge Condition: Stable Ambulatory Status: Ambulatory Discharge Destination: Home Transportation:  Private Auto Accompanied By: self Schedule Follow-up Appointment: Yes Clinical Summary of Care: Patient Declined Electronic Signature(s) Signed: 01/21/2019 3:39:50 PM By: Mikeal Hawthorne EMT/HBOT Entered By: Mikeal Hawthorne on 01/21/2019 11:05:18 -------------------------------------------------------------------------------- Patient/Caregiver Education Details Patient Name: [name], [name] 12/17/2020andnbsp8:00 Date of Service: AM Medical Record [id number] Number: Patient Account Number: [id number] Treating RN: October 04, 1960 (58 y.o. Date of Birth/Gender: M) Other Clinician: Mikeal Hawthorne Primary Care Physician: [name] Treating Linton Ham Referring Physician: Physician/Extender: Donnajean Lopes in Treatment: 13 Education Assessment Education Provided To: Patient Education Topics Provided Hyperbaric Oxygenation: Methods: Explain/Verbal Responses: State content correctly Electronic Signature(s) Signed: 01/21/2019 3:39:50 PM By: Mikeal Hawthorne EMT/HBOT Entered By: Mikeal Hawthorne on 01/21/2019 11:05:07 -------------------------------------------------------------------------------- Vitals Details Patient Name: Date of Service: [name], [name] 01/21/2019 8:00 AM Medical Record [id number] Patient Account Number: [id number] Date of Birth/Sex: Treating RN: 01/19/1961 (58 y.o. M) Primary Care Lucylle Foulkes: [name] Other Clinician: Mikeal Hawthorne Referring Raejean Swinford: Treating Tamer Baughman/Extender:Robson, [name], Alyson Locket in Treatment: 13 Vital Signs Time Taken: 08:00 Temperature (F): 97.8 Height (in): 70 Pulse (bpm): 52 Weight (lbs): 255 Respiratory Rate (breaths/min): 17 Body Mass Index (BMI): 36.6 Blood Pressure (mmHg): 147/84 Capillary Blood Glucose (mg/dl): 142 Reference Range: 80 - 120 mg / dl Electronic Signature(s) Signed: 01/21/2019 3:39:50 PM By: Mikeal Hawthorne EMT/HBOT Entered By: Mikeal Hawthorne on 01/21/2019 08:34:19

## 2019-01-22 ENCOUNTER — Other Ambulatory Visit: Payer: Self-pay

## 2019-01-22 ENCOUNTER — Encounter (HOSPITAL_BASED_OUTPATIENT_CLINIC_OR_DEPARTMENT_OTHER): Payer: Medicare HMO | Admitting: Internal Medicine

## 2019-01-22 DIAGNOSIS — N3041 Irradiation cystitis with hematuria: Secondary | ICD-10-CM | POA: Diagnosis not present

## 2019-01-22 LAB — GLUCOSE, CAPILLARY
Glucose-Capillary: 132 mg/dL — ABNORMAL HIGH (ref 70–99)
Glucose-Capillary: 93 mg/dL (ref 70–99)

## 2019-01-22 NOTE — Progress Notes (Signed)
COSBY, BIRKY ([id number]) Visit Report for 01/22/2019 Arrival Information Details Patient Name: Date of Service: [name], [name] 01/22/2019 8:00 AM Medical Record [id number] Patient Account Number: [id number] Date of Birth/Sex: Treating RN: 02/26/1960 (58 y.o. M) Primary Care [name]: [name] Other Clinician: Mikeal Hawthorne Referring Derrian Rodak: Treating Simaya Lumadue/Extender:Robson, [name], Alyson Locket in Treatment: 13 Visit Information History Since Last Visit Added or deleted any medications: No Patient Arrived: Ambulatory Any new allergies or adverse reactions: No Arrival Time: 07:50 Had a fall or experienced change in No Accompanied By: self activities of daily living that may affect Transfer Assistance: None risk of falls: Patient Identification Verified: Yes Signs or symptoms of abuse/neglect since last No Secondary Verification Process Yes visito Completed: Hospitalized since last visit: No Patient Requires Transmission-Based No Implantable device outside of the clinic excluding No Precautions: cellular tissue based products placed in the center Patient Has Alerts: No since last visit: Pain Present Now: No Electronic Signature(s) Signed: 01/22/2019 5:07:56 PM By: Mikeal Hawthorne EMT/HBOT Entered By: Mikeal Hawthorne on 01/22/2019 08:22:29 -------------------------------------------------------------------------------- Encounter Discharge Information Details Patient Name: Date of Service: [name], [name] 01/22/2019 8:00 AM Medical Record [id number] Patient Account Number: [id number] Date of Birth/Sex: Treating RN: [birth year] (58 y.o. M) Primary Care Crystin Lechtenberg: [name] Other Clinician: Mikeal Hawthorne Referring Loveda Colaizzi: Treating Khoury Siemon/Extender:Robson, [name], Alyson Locket in Treatment: 13 Encounter Discharge Information Items Discharge Condition: Stable Ambulatory Status: Ambulatory Discharge Destination: Home Transportation:  Private Auto Accompanied By: self Schedule Follow-up Appointment: Yes Clinical Summary of Care: Patient Declined Electronic Signature(s) Signed: 01/22/2019 5:07:56 PM By: Mikeal Hawthorne EMT/HBOT Entered By: Mikeal Hawthorne on 01/22/2019 10:55:48 -------------------------------------------------------------------------------- Patient/Caregiver Education Details Patient Name: [name], [name] 12/18/2020andnbsp8:00 Date of Service: AM Medical Record [id number] Number: Patient Account Number: [id number] Treating RN: 06/02/1960 (58 y.o. Date of Birth/Gender: M) Other Clinician: Mikeal Hawthorne Primary Care Physician: [name] Treating Linton Ham Referring Physician: Physician/Extender: Donnajean Lopes in Treatment: 13 Education Assessment Education Provided To: Patient Education Topics Provided Hyperbaric Oxygenation: Methods: Explain/Verbal Responses: State content correctly Electronic Signature(s) Signed: 01/22/2019 5:07:56 PM By: Mikeal Hawthorne EMT/HBOT Entered By: Mikeal Hawthorne on 01/22/2019 10:55:37 -------------------------------------------------------------------------------- Vitals Details Patient Name: Date of Service: [name], [name] 01/22/2019 8:00 AM Medical Record [id number] Patient Account Number: [id number] Date of Birth/Sex: Treating RN: [birth year] (58 y.o. M) Primary Care Elia Nunley: [name] Other Clinician: Mikeal Hawthorne Referring Akita Maxim: Treating Jensyn Shave/Extender:Robson, [name], Alyson Locket in Treatment: 13 Vital Signs Time Taken: 07:55 Temperature (F): 98.7 Height (in): 70 Pulse (bpm): 66 Weight (lbs): 255 Respiratory Rate (breaths/min): 16 Body Mass Index (BMI): 36.6 Blood Pressure (mmHg): 160/94 Capillary Blood Glucose (mg/dl): 132 Reference Range: 80 - 120 mg / dl Electronic Signature(s) Signed: 01/22/2019 5:07:56 PM By: Mikeal Hawthorne EMT/HBOT Entered By: Mikeal Hawthorne on 01/22/2019 08:22:48

## 2019-01-22 NOTE — Progress Notes (Addendum)
[name], [name] ([id number]) Visit Report for 01/22/2019 HBO Details Patient Name: Date of Service: [name], [name] 01/22/2019 8:00 AM Medical Record [id number] Patient Account Number: [id number] Date of Birth/Sex: Treating RN: [birth year] (58 y.o. M) Primary Care Briselda Naval: [name] Other Clinician: Mikeal Hawthorne Referring Wallie Lagrand: Treating Sherrel Shafer/Extender:Robson, [name], Alyson Locket in Treatment: 13 HBO Treatment Course Details Treatment Course Number: 1 Ordering Falon Flinchum: Linton Ham Total Treatments Ordered: 40 HBO Treatment Start Date: 11/16/2018 HBO Indication: Late Effect of Radiation HBO Treatment Details Treatment Number: 38 Patient Type: Outpatient Chamber Type: Monoplace Chamber Serial #: [id number] Treatment Protocol: 2.5 ATA with 90 minutes oxygen, with two 5 minute air breaks Treatment Details Compression Rate Down: 2.0 psi / minute De-Compression Rate Up: 2.0 psi / minute Air breaks and CompressTx Pressure breathing periods DecompressDecompress Begins Reached (leave unused spaces Begins Ends blank) Chamber Pressure (ATA)1 2.5 2.5 2.5 2.5 2.5 --2.5 1 Clock Time (24 hr) 08:12 08:24 [id number] 10:16 Treatment Length: 124 (minutes) Treatment Segments: 4 Vital Signs Capillary Blood Glucose Reference Range: 80 - 120 mg / dl HBO Diabetic Blood Glucose Intervention Range: <131 mg/dl or >249 mg/dl Time Vitals Blood Respiratory Capillary Blood Glucose Pulse Action Type: Pulse: Temperature: Taken: Pressure: Rate: Glucose (mg/dl): Meter #: Oximetry (%) Taken: Pre 07:55 160/94 66 16 98.7 132 Post 10:20 159/99 61 14 98 93 Treatment Response Treatment Toleration: Well Treatment Completion Treatment Completed without Adverse Event Status: Arianny Pun Notes No concerns with treatment given Physician HBO Attestation: I certify that I supervised this HBO treatment in accordance with Medicare guidelines. A trained Yes Yes emergency  response team is readily available per hospital policies and procedures. Continue HBOT as ordered. Yes Electronic Signature(s) Signed: 01/22/2019 6:01:02 PM By: Linton Ham MD Previous Signature: 01/22/2019 5:07:56 PM Version By: Mikeal Hawthorne EMT/HBOT Entered By: Linton Ham on 01/22/2019 17:12:29 -------------------------------------------------------------------------------- HBO Safety Checklist Details Patient Name: Date of Service: [name], [name] 01/22/2019 8:00 AM Medical Record [id number] Patient Account Number: [id number] Date of Birth/Sex: Treating RN: [birth year] (58 y.o. M) Primary Care Jermaine Neuharth: [name] Other Clinician: Mikeal Hawthorne Referring Refoel Palladino: Treating Kenyata Guess/Extender:Robson, [name], Alyson Locket in Treatment: 13 HBO Safety Checklist Items Safety Checklist Consent Form Signed Patient voided / foley secured and emptied When did you last eato 0700 - coffee Last dose of injectable or oral agent metformin NA Ostomy pouch emptied and vented if applicable NA All implantable devices assessed, documented and approved NA Intravenous access site secured and place Valuables secured Linens and cotton and cotton/polyester blend (less than 51% polyester) Personal oil-based products / skin lotions / body lotions removed NA Wigs or hairpieces removed NA Smoking or tobacco materials removed Books / newspapers / magazines / loose paper removed Cologne, aftershave, perfume and deodorant removed Jewelry removed (may wrap wedding band) NA Make-up removed Hair care products removed NA Battery operated devices (external) removed NA Heating patches and chemical warmers removed NA Titanium eyewear removed NA Nail polish cured greater than 10 hours NA Casting material cured greater than 10 hours NA Hearing aids removed Loose dentures or partials removed NA Prosthetics have been removed Patient demonstrates correct use of air break device (if  applicable) Patient concerns have been addressed Patient grounding bracelet on and cord attached to chamber Specifics for Inpatients (complete in addition to above) Medication sheet sent with patient Intravenous medications needed or due during therapy sent with patient Drainage tubes (e.g. nasogastric tube or chest tube secured and vented) Endotracheal or Tracheotomy tube secured Cuff deflated of air and inflated with saline Airway  suctioned Electronic Signature(s) Signed: 01/22/2019 8:23:36 AM By: Mikeal Hawthorne EMT/HBOT Entered By: Mikeal Hawthorne on 01/22/2019 08:23:36

## 2019-01-22 NOTE — Progress Notes (Signed)
JONAVEN, CALLANAN ([id number]) Visit Report for 01/22/2019 SuperBill Details Patient Name: Date of Service: LELEND, AGUINO 01/22/2019 Medical Record [id number] Patient Account Number: [id number] Date of Birth/Sex: Treating RN: August 18, 1960 (58 y.o. 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.41 Irradiation cystitis with hematuria Z85.46 Personal history of malignant neoplasm of prostate Facility Procedures CPT4 Code Description Modifier Quantity [id number] G0277-(Facility Use Only) HBOT, full body chamber, 62min 4 Physician Procedures CPT4 Code Description Modifier Quantity [id number] - WC PHYS HYPERBARIC OXYGEN THERAPY 1 ICD-10 Diagnosis Description 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

## 2019-01-25 ENCOUNTER — Other Ambulatory Visit: Payer: Self-pay

## 2019-01-25 ENCOUNTER — Encounter (HOSPITAL_BASED_OUTPATIENT_CLINIC_OR_DEPARTMENT_OTHER): Payer: Medicare HMO | Admitting: Internal Medicine

## 2019-01-25 DIAGNOSIS — N3041 Irradiation cystitis with hematuria: Secondary | ICD-10-CM | POA: Diagnosis not present

## 2019-01-25 LAB — GLUCOSE, CAPILLARY
Glucose-Capillary: 143 mg/dL — ABNORMAL HIGH (ref 70–99)
Glucose-Capillary: 93 mg/dL (ref 70–99)

## 2019-01-25 NOTE — Progress Notes (Addendum)
[name], RONNINGEN ([id number]) Visit Report for 01/25/2019 HBO Details Patient Name: Date of Service: [name], [name] 01/25/2019 10:30 AM Medical Record [id number] Patient Account Number: [id number] Date of Birth/Sex: Treating RN: 07/12/1960 (58 y.o. M) Primary Care Kwame Ryland: [name] Other Clinician: Mikeal Hawthorne Referring Teagyn Fishel: Treating Tillie Viverette/Extender:Robson, [name], Alyson Locket in Treatment: 13 HBO Treatment Course Details Treatment Course Number: 1 Ordering Shaneece Stockburger: Linton Ham Total Treatments Ordered: 40 HBO Treatment Start Date: 11/16/2018 HBO Indication: Late Effect of Radiation HBO Treatment Details Treatment Number: 39 Patient Type: Outpatient Chamber Type: Monoplace Chamber Serial #: [id number] Treatment Protocol: 2.5 ATA with 90 minutes oxygen, with two 5 minute air breaks Treatment Details Compression Rate Down: 2.0 psi / minute De-Compression Rate Up: 2.0 psi / minute Air breaks and CompressTx Pressure breathing periods DecompressDecompress Begins Reached (leave unused spaces Begins Ends blank) Chamber Pressure (ATA)1 2.5 2.5 2.5 2.5 2.5 --2.5 1 Clock Time (24 hr) 10:52 11:04 11:3411:3912:0912:14--12:44 12:56 Treatment Length: 124 (minutes) Treatment Segments: 4 Vital Signs Capillary Blood Glucose Reference Range: 80 - 120 mg / dl HBO Diabetic Blood Glucose Intervention Range: <131 mg/dl or >249 mg/dl Time Vitals Blood Respiratory Capillary Blood Glucose Pulse Action Type: Pulse: Temperature: Taken: Pressure: Rate: Glucose (mg/dl): Meter #: Oximetry (%) Taken: Pre 10:45 119/79 63 17 97.6 143 Post 13:00 160/87 67 15 97.4 93 Treatment Response Treatment Toleration: Well Treatment Completion Treatment Completed without Adverse Event Status: Malayla Granberry Notes No concerns with treatment given Physician HBO Attestation: I certify that I supervised this HBO treatment in accordance with Medicare guidelines. A  trained Yes Yes emergency response team is readily available per hospital policies and procedures. Continue HBOT as ordered. Yes Electronic Signature(s) Signed: 01/26/2019 7:47:18 AM By: Linton Ham MD Entered By: Linton Ham on 01/25/2019 16:39:54 -------------------------------------------------------------------------------- HBO Safety Checklist Details Patient Name: Date of Service: [name], [name] 01/25/2019 10:30 AM Medical Record [id number] Patient Account Number: [id number] Date of Birth/Sex: Treating RN: [birth year] (58 y.o. M) Primary Care Marcques Wrightsman: [name] Other Clinician: Mikeal Hawthorne Referring Jalie Eiland: Treating Norman Piacentini/Extender:Robson, [name], Alyson Locket in Treatment: 13 HBO Safety Checklist Items Safety Checklist Consent Form Signed Patient voided / foley secured and emptied When did you last eato 0900 - biscuit / coffee Last dose of injectable or oral agent metformin NA Ostomy pouch emptied and vented if applicable NA All implantable devices assessed, documented and approved NA Intravenous access site secured and place Valuables secured Linens and cotton and cotton/polyester blend (less than 51% polyester) Personal oil-based products / skin lotions / body lotions removed NA Wigs or hairpieces removed NA Smoking or tobacco materials removed Books / newspapers / magazines / loose paper removed Cologne, aftershave, perfume and deodorant removed Jewelry removed (may wrap wedding band) NA Make-up removed Hair care products removed NA Battery operated devices (external) removed NA Heating patches and chemical warmers removed NA Titanium eyewear removed NA Nail polish cured greater than 10 hours NA Casting material cured greater than 10 hours NA Hearing aids removed Loose dentures or partials removed NA Prosthetics have been removed Patient demonstrates correct use of air break device (if applicable) Patient concerns have been  addressed Patient grounding bracelet on and cord attached to chamber Specifics for Inpatients (complete in addition to above) Medication sheet sent with patient Intravenous medications needed or due during therapy sent with patient Drainage tubes (e.g. nasogastric tube or chest tube secured and vented) Endotracheal or Tracheotomy tube secured Cuff deflated of air and inflated with saline Airway suctioned Electronic Signature(s) Signed: 01/25/2019 11:24:12 AM By:  Mikeal Hawthorne EMT/HBOT Entered By: Mikeal Hawthorne on 01/25/2019 11:24:10

## 2019-01-25 NOTE — Progress Notes (Signed)
[name] ([id number]) Visit Report for 01/25/2019 Arrival Information Details Patient Name: Date of Service: [name], [name] 01/25/2019 10:30 AM Medical Record [id number] Patient Account Number: [id number] Date of Birth/Sex: Treating RN: March 19, 1960 (58 y.o. M) Primary Care Yug Loria: [name] Other Clinician: Mikeal Hawthorne Referring Kellan Raffield: Treating Masey Scheiber/Extender:Robson, [name], Alyson Locket in Treatment: 13 Visit Information History Since Last Visit Added or deleted any medications: No Patient Arrived: Ambulatory Any new allergies or adverse reactions: No Arrival Time: 10:40 Had a fall or experienced change in No Accompanied By: self activities of daily living that may affect Transfer Assistance: None risk of falls: Patient Identification Verified: Yes Signs or symptoms of abuse/neglect since last No Secondary Verification Process Yes visito Completed: Hospitalized since last visit: No Patient Requires Transmission-Based No Implantable device outside of the clinic excluding No Precautions: cellular tissue based products placed in the center Patient Has Alerts: No since last visit: Pain Present Now: No Electronic Signature(s) Signed: 01/25/2019 6:06:17 PM By: Mikeal Hawthorne EMT/HBOT Entered By: Mikeal Hawthorne on 01/25/2019 11:22:57 -------------------------------------------------------------------------------- Encounter Discharge Information Details Patient Name: Date of Service: [name], [name] 01/25/2019 10:30 AM Medical Record [id number] Patient Account Number: [id number] Date of Birth/Sex: Treating RN: 1960-05-29 (58 y.o. M) Primary Care Vernice Mannina: [name] Other Clinician: Mikeal Hawthorne Referring [name] Haack: Treating Pape Parson/Extender:Robson, [name], Alyson Locket in Treatment: 13 Encounter Discharge Information Items Discharge Condition: Stable Ambulatory Status: Ambulatory Discharge Destination: Home Transportation:  Private Auto Accompanied By: self Schedule Follow-up Appointment: Yes Clinical Summary of Care: Patient Declined Electronic Signature(s) Signed: 01/25/2019 6:06:17 PM By: Mikeal Hawthorne EMT/HBOT Entered By: Mikeal Hawthorne on 01/25/2019 13:04:50 -------------------------------------------------------------------------------- Patient/Caregiver Education Details Patient Name: [name] 12/21/2020andnbsp10:30 Date of Service: AM Medical Record [id number] Number: Patient Account Number: [id number] Treating RN: [birth year] (58 y.o. Date of Birth/Gender: M) Other Clinician: Mikeal Hawthorne Primary Care Treating Misty Stanley Physician: Physician/Extender: Referring Physician: Donnajean Lopes in Treatment: 13 Education Assessment Education Provided To: Patient Education Topics Provided Hyperbaric Oxygenation: Methods: Explain/Verbal Responses: State content correctly Electronic Signature(s) Signed: 01/25/2019 6:06:17 PM By: Mikeal Hawthorne EMT/HBOT Entered By: Mikeal Hawthorne on 01/25/2019 13:04:38 -------------------------------------------------------------------------------- Vitals Details Patient Name: Date of Service: [name], [name] 01/25/2019 10:30 AM Medical Record [id number] Patient Account Number: [id number] Date of Birth/Sex: Treating RN: 01/22/1961 (58 y.o. M) Primary Care Linkin Vizzini: [name] Other Clinician: Mikeal Hawthorne Referring Sakinah Rosamond: Treating Egor Fullilove/Extender:Robson, [name], Alyson Locket in Treatment: 13 Vital Signs Time Taken: 10:45 Temperature (F): 97.6 Height (in): 70 Pulse (bpm): 63 Weight (lbs): 255 Respiratory Rate (breaths/min): 17 Body Mass Index (BMI): 36.6 Blood Pressure (mmHg): 119/79 Capillary Blood Glucose (mg/dl): 143 Reference Range: 80 - 120 mg / dl Electronic Signature(s) Signed: 01/25/2019 6:06:17 PM By: Mikeal Hawthorne EMT/HBOT Entered By: Mikeal Hawthorne on 01/25/2019 11:23:15

## 2019-01-26 ENCOUNTER — Encounter (HOSPITAL_BASED_OUTPATIENT_CLINIC_OR_DEPARTMENT_OTHER): Payer: Medicare HMO | Admitting: Internal Medicine

## 2019-01-26 ENCOUNTER — Other Ambulatory Visit: Payer: Self-pay

## 2019-01-26 DIAGNOSIS — N3041 Irradiation cystitis with hematuria: Secondary | ICD-10-CM | POA: Diagnosis not present

## 2019-01-26 LAB — GLUCOSE, CAPILLARY
Glucose-Capillary: 178 mg/dL — ABNORMAL HIGH (ref 70–99)
Glucose-Capillary: 106 mg/dL — ABNORMAL HIGH (ref 70–99)

## 2019-01-26 NOTE — Progress Notes (Signed)
[name], [name] ([id number]) Visit Report for 01/26/2019 Arrival Information Details Patient Name: Date of Service: [name], [name] 01/26/2019 8:00 AM Medical Record [id number] Patient Account Number: [id number] Date of Birth/Sex: Treating RN: 09/08/1960 (58 y.o. M) Primary Care Charlesa Ehle: [name] Other Clinician: Mikeal Hawthorne Referring Robby Bulkley: Treating Amran Malter/Extender:Robson, [name], Alyson Locket in Treatment: 14 Visit Information History Since Last Visit Added or deleted any medications: No Patient Arrived: Ambulatory Any new allergies or adverse reactions: No Arrival Time: 07:50 Had a fall or experienced change in No Accompanied By: self activities of daily living that may affect Transfer Assistance: None risk of falls: Patient Identification Verified: Yes Signs or symptoms of abuse/neglect since last No Secondary Verification Process Yes visito Completed: Hospitalized since last visit: No Patient Requires Transmission-Based No Implantable device outside of the clinic excluding No Precautions: cellular tissue based products placed in the center Patient Has Alerts: No since last visit: Pain Present Now: No Electronic Signature(s) Signed: 01/26/2019 3:57:28 PM By: Mikeal Hawthorne EMT/HBOT Entered By: Mikeal Hawthorne on 01/26/2019 08:15:32 -------------------------------------------------------------------------------- Encounter Discharge Information Details Patient Name: Date of Service: [name], [name] 01/26/2019 8:00 AM Medical Record [id number] Patient Account Number: [id number] Date of Birth/Sex: Treating RN: 1960/07/17 (58 y.o. M) Primary Care Meztli Llanas: [name] Other Clinician: Mikeal Hawthorne Referring Caridad Silveira: Treating Lyndzee Kliebert/Extender:Robson, [name], Alyson Locket in Treatment: 14 Encounter Discharge Information Items Discharge Condition: Stable Ambulatory Status: Ambulatory Discharge Destination: Home Transportation:  Private Auto Accompanied By: self Schedule Follow-up Appointment: Yes Clinical Summary of Care: Patient Declined Electronic Signature(s) Signed: 01/26/2019 3:57:28 PM By: Mikeal Hawthorne EMT/HBOT Entered By: Mikeal Hawthorne on 01/26/2019 11:00:47 -------------------------------------------------------------------------------- Patient/Caregiver Education Details Patient Name: [name], [name] 12/22/2020andnbsp8:00 Date of Service: AM Medical Record [id number] Number: Patient Account Number: [id number] Treating RN: 10-09-1960 (58 y.o. Date of Birth/Gender: M) Other Clinician: Mikeal Hawthorne Primary Care Physician: [name] Treating Linton Ham Referring Physician: Physician/Extender: Donnajean Lopes in Treatment: 14 Education Assessment Education Provided To: Patient Education Topics Provided Hyperbaric Oxygenation: Methods: Explain/Verbal Responses: State content correctly Electronic Signature(s) Signed: 01/26/2019 3:57:28 PM By: Mikeal Hawthorne EMT/HBOT Entered By: Mikeal Hawthorne on 01/26/2019 11:00:30 -------------------------------------------------------------------------------- Vitals Details Patient Name: Date of Service: [name], [name] 01/26/2019 8:00 AM Medical Record [id number] Patient Account Number: [id number] Date of Birth/Sex: Treating RN: 1960-04-03 (58 y.o. M) Primary Care Kynedi Profitt: [name] Other Clinician: Referring Cassidi Modesitt: Treating Rishawn Walck/Extender:Robson, [name], Alyson Locket in Treatment: 14 Vital Signs Time Taken: 07:55 Temperature (F): 97.5 Height (in): 70 Pulse (bpm): 59 Weight (lbs): 255 Respiratory Rate (breaths/min): 18 Body Mass Index (BMI): 36.6 Blood Pressure (mmHg): 174/87 Capillary Blood Glucose (mg/dl): 178 Reference Range: 80 - 120 mg / dl Electronic Signature(s) Signed: 01/26/2019 3:57:28 PM By: Mikeal Hawthorne EMT/HBOT Entered By: Mikeal Hawthorne on 01/26/2019 08:15:47

## 2019-01-26 NOTE — Progress Notes (Signed)
[name], [name] ([id number]) Visit Report for 01/25/2019 SuperBill Details Patient Name: Date of Service: [name], [name] 01/25/2019 Medical Record [id number] Patient Account Number: [id number] Date of Birth/Sex: Treating RN: 05-23-1960 (58 y.o. 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.41 Irradiation cystitis with hematuria Z85.46 Personal history of malignant neoplasm of prostate Facility Procedures CPT4 Code Description Modifier Quantity [id number] G0277-(Facility Use Only) HBOT, full body chamber, 53min 4 Physician Procedures CPT4 Code Description Modifier Quantity [id number] - WC PHYS HYPERBARIC OXYGEN THERAPY 1 ICD-10 Diagnosis Description 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

## 2019-01-26 NOTE — Progress Notes (Signed)
MAYES, SCHUDEL ([id number]) Visit Report for 01/26/2019 SuperBill Details Patient Name: Date of Service: [name], [name] 01/26/2019 Medical Record [id number] Patient Account Number: [id number] Date of Birth/Sex: Treating RN: 1960-05-26 (58 y.o. M) Primary Care Provider: [name] Other Clinician: Mikeal Hawthorne Referring Provider: Treating Provider/Extender:Kilie Rund, [name], Alyson Locket in Treatment: 14 Diagnosis Coding ICD-10 Codes Code Description N30.41 Irradiation cystitis with hematuria Z85.46 Personal history of malignant neoplasm of prostate Facility Procedures CPT4 Code Description Modifier Quantity [id number]-(Facility Use Only) HBOT, full body chamber, 59min 4 Physician Procedures CPT4 Code Description Modifier Quantity [id number] - WC PHYS HYPERBARIC OXYGEN THERAPY 1 ICD-10 Diagnosis Description N30.41 Irradiation cystitis with hematuria Electronic Signature(s) Signed: 01/26/2019 3:57:28 PM By: Mikeal Hawthorne EMT/HBOT Signed: 01/26/2019 6:32:44 PM By: Linton Ham MD Entered By: Mikeal Hawthorne on 01/26/2019 10:59:47

## 2019-01-26 NOTE — Progress Notes (Addendum)
[name] ([id number]) Visit Report for 01/26/2019 HBO Details Patient Name: Date of Service: [name], [name] 01/26/2019 8:00 AM Medical Record [id number] Patient Account Number: [id number] Date of Birth/Sex: Treating RN: 10/15/1960 (58 y.o. M) Primary Care Sylvio Weatherall: [name] Other Clinician: Mikeal Hawthorne Referring Tawfiq Favila: Treating Rik Wadel/Extender:Robson, [name], Alyson Locket in Treatment: 14 HBO Treatment Course Details Treatment Course Number: 1 Ordering Linton Ham Rolinda Impson: Total Treatments Ordered: 40 HBO HBO Indication: Treatment 11/16/2018 Late Effect of Radiation Start Date: HBO Treatment 01/26/2019 End Date: HBO Treatment Series Complete; Non-Wound Discharge Protocol Completed with Symptom Relief Outcome: HBO Treatment Details Treatment Number: 40 Patient Type: Outpatient Chamber Type: Monoplace Chamber Serial #: [id number] Treatment Protocol: 2.5 ATA with 90 minutes oxygen, with two 5 minute air breaks Treatment Details Compression Rate Down: 2.0 psi / minute De-Compression Rate Up: 2.0 psi / minute Air breaks and CompressTx Pressure breathing periods DecompressDecompress Begins Reached (leave unused spaces Begins Ends blank) Chamber Pressure (ATA)1 2.5 2.5 2.5 2.5 2.5 --2.5 1 Clock Time (24 hr) 08:10 08:22 [id number] 10:14 Treatment Length: 124 (minutes) Treatment Segments: 4 Vital Signs Capillary Blood Glucose Reference Range: 80 - 120 mg / dl HBO Diabetic Blood Glucose Intervention Range: <131 mg/dl or >249 mg/dl Time Vitals Blood Respiratory Capillary Blood Glucose Pulse Action Type: Pulse: Temperature: Taken: Pressure: Rate: Glucose (mg/dl): Meter #: Oximetry (%) Taken: Pre 07:55 174/87 59 18 97.5 178 Post 10:17 128/73 58 16 98 106 Treatment Response Treatment Toleration: Well Treatment Completion Treatment Completed without Adverse Event Status: [name] Notes Patient's last treatment. He states  his symptoms are a lot better. Physician HBO Attestation: I certify that I supervised this HBO treatment in accordance with Medicare guidelines. A trained Yes emergency response team is readily available per hospital policies and procedures. Continue HBOT as ordered. Yes Electronic Signature(s) Signed: 01/26/2019 6:32:44 PM By: Linton Ham MD Previous Signature: 01/26/2019 3:57:28 PM Version By: Mikeal Hawthorne EMT/HBOT Entered By: Linton Ham on 01/26/2019 18:28:21 -------------------------------------------------------------------------------- HBO Safety Checklist Details Patient Name: Date of Service: [name] 01/26/2019 8:00 AM Medical Record [id number] Patient Account Number: [id number] Date of Birth/Sex: Treating RN: 21-Jan-1961 (58 y.o. M) Primary Care Darnesha Diloreto: [name] Other Clinician: Mikeal Hawthorne Referring Keiana Tavella: Treating Hiedi Touchton/Extender:Robson, [name], Alyson Locket in Treatment: 14 HBO Safety Checklist Items Safety Checklist Consent Form Signed Patient voided / foley secured and emptied When did you last eato 0700 - coffee Last dose of injectable or oral agent metformin NA Ostomy pouch emptied and vented if applicable NA All implantable devices assessed, documented and approved NA Intravenous access site secured and place Valuables secured Linens and cotton and cotton/polyester blend (less than 51% polyester) Personal oil-based products / skin lotions / body lotions removed NA Wigs or hairpieces removed NA Smoking or tobacco materials removed Books / newspapers / magazines / loose paper removed Cologne, aftershave, perfume and deodorant removed Jewelry removed (may wrap wedding band) NA Make-up removed Hair care products removed NA Battery operated devices (external) removed NA Heating patches and chemical warmers removed NA Titanium eyewear removed NA Nail polish cured greater than 10 hours NA Casting material cured  greater than 10 hours NA Hearing aids removed Loose dentures or partials removed NA Prosthetics have been removed Patient demonstrates correct use of air break device (if applicable) Patient concerns have been addressed Patient grounding bracelet on and cord attached to chamber Specifics for Inpatients (complete in addition to above) Medication sheet sent with patient Intravenous medications needed or due during therapy sent with patient Drainage tubes (e.g.  nasogastric tube or chest tube secured and vented) Endotracheal or Tracheotomy tube secured Cuff deflated of air and inflated with saline Airway suctioned Electronic Signature(s) Signed: 01/26/2019 8:18:11 AM By: Mikeal Hawthorne EMT/HBOT Entered By: Mikeal Hawthorne on 01/26/2019 08:18:10

## 2019-02-14 ENCOUNTER — Other Ambulatory Visit: Payer: Self-pay | Admitting: Cardiology

## 2019-02-16 NOTE — Telephone Encounter (Signed)
Refill Irena Gaydos  

## 2019-02-18 ENCOUNTER — Encounter (HOSPITAL_BASED_OUTPATIENT_CLINIC_OR_DEPARTMENT_OTHER): Payer: Self-pay | Admitting: Urology

## 2019-02-18 ENCOUNTER — Other Ambulatory Visit: Payer: Self-pay

## 2019-02-18 ENCOUNTER — Other Ambulatory Visit (HOSPITAL_COMMUNITY)
Admission: RE | Admit: 2019-02-18 | Discharge: 2019-02-18 | Disposition: A | Discharge status: Home / Self Care | Payer: Medicare HMO | Source: Ambulatory Visit | Attending: Urology | Admitting: Urology

## 2019-02-18 DIAGNOSIS — Z20822 Contact with and (suspected) exposure to covid-19: Secondary | ICD-10-CM | POA: Insufficient documentation

## 2019-02-18 DIAGNOSIS — Z01812 Encounter for preprocedural laboratory examination: Secondary | ICD-10-CM | POA: Diagnosis present

## 2019-02-18 NOTE — Progress Notes (Signed)
Cardiology Clinic Note   Patient Name: [name] Date of Encounter: 02/19/2019  Primary Care Provider:  [name], MD Primary Cardiologist:  [name], MD  Patient Profile    [name] 59 year old male presents today for follow-up of his non-STEMI, coronary artery disease status post PCA, and preoperative cardiac evaluation.  Past Medical History    Past Medical History:  Diagnosis Date  . Anxiety   . Arthritis    "neck, shoulders, lower back" (09/15/2017)  . Carpal tunnel syndrome   . Chronic back pain    "neck, lower back" (09/15/2017)  . Depression   . DVT (deep venous thrombosis) ([place]) ~ 2016   following prostate surgery, treated with course of blood thinners.no blood clot found 2nd [place]  . Erectile dysfunction   . Fibromyalgia   . Former tobacco use   . Heart murmur    "born w/one but it closed"  . Hyperlipidemia   . Hypertension   . 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 Prostate cancer ([place]) 2016   a. s/p prostate surgery.  . Sciatica   . Spinal stenosis   . STEMI (ST elevation myocardial infarction) ([place]) 09/13/2017   PCI/DESx1 to the LAD, with staged intervention DESx1 to the RCA, normal EF  . Type II diabetes mellitus ([place])    Past Surgical History:  Procedure Laterality Date  . ANTERIOR CERVICAL DECOMP/DISCECTOMY FUSION  03/2015  . APPENDECTOMY  1995  . BILATERAL CARPAL TUNNEL RELEASE Bilateral 2017   "30 days apart"  . CORONARY ANGIOGRAPHY N/A 09/15/2017   Procedure: CORONARY ANGIOGRAPHY;  Surgeon: Jettie Booze, MD;  Location: [place] [facility];  Service: Cardiovascular;  Laterality: N/A;  . CORONARY ANGIOPLASTY WITH STENT PLACEMENT  09/15/2017  . CORONARY STENT INTERVENTION N/A 09/15/2017   Procedure: CORONARY STENT INTERVENTION;  Surgeon: Jettie Booze, MD;  Location: [place] [facility];  Service: Cardiovascular;  Laterality: N/A;  . CORONARY/GRAFT ACUTE MI REVASCULARIZATION N/A 09/13/2017   Procedure: Coronary/Graft  Acute MI Revascularization;  Surgeon: Troy Sine, MD;  Location: [place] [facility];  Service: Cardiovascular;  Laterality: N/A;  . LEFT HEART CATH AND CORONARY ANGIOGRAPHY N/A 09/13/2017   Procedure: LEFT HEART CATH AND CORONARY ANGIOGRAPHY;  Surgeon: Troy Sine, MD;  Location: [place] [facility] [facility];  Service: Cardiovascular;  Laterality: N/A;  . OTHER SURGICAL HISTORY  2003   "genital warts removed"  . PROSTATECTOMY  2016    Allergies  Allergies  Allergen Reactions  . Cephalexin Hives and Swelling  . Belbuca [Buprenorphine Hcl]     dizzy    History of Present Illness  [name] was admitted 09/2017 for a non-STEMI.  He underwent cardiac catheterization which showed an 85% distal LAD, 80% proximal RCA, 90% mid RCA, occluded circumflex, and occluded marginal.  He had PCI to his circumflex and circumflex marginal and subsequently had a staged PCI of his right coronary artery.  His echocardiogram 09/23/2017 showed an ejection fraction of 50%, and grade 2 diastolic dysfunction.  He was last seen by Dr. Stanford Breed on 06/08/2018.  During that time he indicated dyspnea with more extreme activities but not with normal routine activities.  He denied chest discomfort, orthopnea, PND, and pedal edema.  He had no exertional chest pain syncope or palpitations.  He presents the clinic today and states he would like to have a penile implant surgery.  He states that he has been trying to eat a low-sodium diet.  He makes  his own chicken and prepares his own [place] meat.  He also states he has a greenhouse in his backyard where he cares for several plants throughout the spring and summer.  He is also active with his 2 dogs and completes yard work as well.  He states he has not as active as he once was due to back , leg pain, and the COVID-19 pandemic.  I will clear for his upcoming surgery.  He denies chest pain, shortness of breath, lower extremity edema, fatigue, palpitations, melena, hematuria,  hemoptysis, diaphoresis, weakness, presyncope, syncope, orthopnea, and PND.   Home Medications    Prior to Admission medications   Medication Sig Start Date End Date Taking? Authorizing Provider  aspirin EC 81 MG tablet Take 81 mg by mouth daily.    [provider]  atorvastatin (LIPITOR) 80 MG tablet TAKE 1 TABLET BY MOUTH ONCE DAILY AT  [facility] 02/16/19   Lelon Perla, MD  Buprenorphine HCl (BELBUCA) 150 MCG FILM Place inside cheek. 06/11/18   [provider]  busPIRone (BUSPAR) 10 MG tablet Take 1 tablet (10 mg total) by mouth at bedtime. 09/16/17   Cheryln Manly, NP  citalopram (CELEXA) 40 MG tablet Take 40 mg by mouth daily.    [provider]  diazepam (VALIUM) 5 MG tablet  09/21/18   [provider]  docusate sodium (COLACE) 100 MG capsule Take 100 mg by mouth 2 (two) times daily.    [provider]  gabapentin (NEURONTIN) 300 MG capsule TAKE 1 CAPSULE BY MOUTH THREE TIMES DAILY 07/03/18   [provider]  glipiZIDE (GLUCOTROL XL) 5 MG 24 hr tablet Take 5 mg by mouth daily. 09/14/14 09/13/25  [provider]  hydrocortisone (ANUCORT-HC) 25 MG suppository Place 1 suppository rectally daily as needed for hemorrhoids or itching.  09/28/14   [provider]  lisinopril (PRINIVIL,ZESTRIL) 10 MG tablet Take 1 tablet (10 mg total) by mouth daily. 02/24/13   Lorayne Marek, MD  memantine (NAMENDA) 10 MG tablet Take 10 mg by mouth 2 (two) times daily.    [provider]  metFORMIN (GLUCOPHAGE) 500 MG tablet Take 1 tablet (500 mg total) by mouth 2 (two) times daily with a meal. 02/24/13   Advani, Vernon Prey, MD  metoprolol tartrate (LOPRESSOR) 25 MG tablet Take 0.5 tablets (12.5 mg total) by mouth 2 (two) times daily. (BETA BLOCKER) 02/17/19   Lelon Perla, MD  mirtazapine (REMERON) 30 MG tablet Take by mouth. 02/19/17   [provider]  modafinil (PROVIGIL) 100 MG tablet TAKE 1 2 (ONE HALF) TO 1 TABLET BY MOUTH  ONCE DAILY 02/05/18   [provider]  mupirocin ointment (BACTROBAN) 2 % Place 1 application into the nose 2 (two) times daily.    [provider]  nitroGLYCERIN (NITROSTAT) 0.4 MG SL tablet Place 1 tablet (0.4 mg total) under the tongue every 5 (five) minutes x 3 doses as needed for chest pain. 09/16/17   Cheryln Manly, NP  polyethylene glycol (MIRALAX / GLYCOLAX) packet Take 17 g by mouth daily.    [provider]  sildenafil (VIAGRA) 100 MG tablet Take 100 mg by mouth daily as needed for erectile dysfunction.  11/02/14   [provider]    Family History    Family History  Problem Relation Age of Onset  . Diabetes Mother   . Arthritis Mother   . Heart disease Mother        stents  . Diabetes Sister   .  Cancer Sister   . Arthritis Maternal Grandmother    He indicated that the status of his mother is unknown. He indicated that the status of his sister is unknown. He indicated that the status of his maternal grandmother is unknown.  Social History    Social History   Socioeconomic History  . Marital status: Married    Spouse name: Not on file  . Number of children: 8  . Years of education: Not on file  . Highest education level: Not on file  Occupational History  . Occupation: truck Geophysicist/field seismologist  Tobacco Use  . Smoking status: Former Smoker    Packs/day: 0.12    Years: 3.00    Pack years: 0.36    Types: Cigarettes  . Smokeless tobacco: Never Used  . Tobacco comment: 09/15/2017 "nothing in the 200s"  Substance and Sexual Activity  . Alcohol use: Not Currently  . Drug use: Yes    Types: Marijuana    Comment: marijuana used daily  . Sexual activity: Not Currently  Other Topics Concern  . Not on file  Social History Narrative   He lives with wife and daughter.   He is currently not working since October 2014.  He was working as a Administrator, previously was doing long distance, but now only locally.    Social Determinants of Health    Financial Resource Strain:   . Difficulty of Paying Living Expenses: Not on file  Food Insecurity:   . Worried About Charity fundraiser in the Last Year: Not on file  . Ran Out of Food in the Last Year: Not on file  Transportation Needs:   . Lack of Transportation (Medical): Not on file  . Lack of Transportation (Non-Medical): Not on file  Physical Activity:   . Days of Exercise per Week: Not on file  . Minutes of Exercise per Session: Not on file  Stress:   . Feeling of Stress : Not on file  Social Connections:   . Frequency of Communication with Friends and Family: Not on file  . Frequency of Social Gatherings with Friends and Family: Not on file  . Attends Religious Services: Not on file  . Active Member of Clubs or Organizations: Not on file  . Attends Archivist Meetings: Not on file  . Marital Status: Not on file  Intimate Partner Violence:   . Fear of Current or Ex-Partner: Not on file  . Emotionally Abused: Not on file  . Physically Abused: Not on file  . Sexually Abused: Not on file     Review of Systems    General:  No chills, fever, night sweats or weight changes.  Cardiovascular:  No chest pain, dyspnea on exertion, edema, orthopnea, palpitations, paroxysmal nocturnal dyspnea. Dermatological: No rash, lesions/masses Respiratory: No cough, dyspnea Urologic: No hematuria, dysuria Abdominal:   No nausea, vomiting, diarrhea, bright red blood per rectum, melena, or hematemesis Neurologic:  No visual changes, wkns, changes in mental status. All other systems reviewed and are otherwise negative except as noted above.  Physical Exam    VS:  BP 124/78 (BP Location: Left Arm, Patient Position: Sitting, Cuff Size: Large)   Pulse (!) 57   Ht 5\' 10"  (1.778 m)   Wt 266 lb 3.2 oz (120.7 kg)   BMI 38.20 kg/m  , BMI Body mass index is 38.2 kg/m. GEN: Well nourished, well developed, in no acute distress. HEENT: normal. Neck: Supple, no JVD, carotid bruits, or  masses. Cardiac: RRR,  no murmurs, rubs, or gallops. No clubbing, cyanosis, edema.  Radials/DP/PT 2+ and equal bilaterally.  Respiratory:  Respirations regular and unlabored, clear to auscultation bilaterally. GI: Soft, nontender, nondistended, BS + x 4. MS: no deformity or atrophy. Skin: warm and dry, no rash. Neuro:  Strength and sensation are intact. Psych: Normal affect.  Accessory Clinical Findings    ECG personally reviewed by me today-sinus bradycardia with sinus arrhythmia possible inferior infarct undetermined age 16 bpm- No acute changes  EKG 10/01/2017 Normal sinus rhythm T wave abnormality consider inferior ischemia 66 bpm  Echocardiogram 09/14/2017 Impressions:  - Mildly dilated LV with EF 50%. Wall motion abnormalities as noted   above. Normal RV size and systolic function. No significant   valvular abnormalities.   Cardiac catheterization 09/13/2017   Dist LAD lesion is 85% stenosed.  Prox RCA lesion is 80% stenosed.  Mid RCA-1 lesion is 80% stenosed.  Mid RCA-2 lesion is 90% stenosed.  Ost Cx to Prox Cx lesion is 100% stenosed.  Ost 1st Mrg lesion is 100% stenosed.  A stent was successfully placed.  Post intervention, there is a 0% residual stenosis.  Mid Cx lesion is 50% stenosed.  Post intervention, there is a 0% residual stenosis.  Post intervention, there is a 0% residual stenosis.   Acute coronary syndrome secondary to total proximal occlusion of a large left circumflex coronary artery.  Concomitant CAD with 85% distal LAD stenosis and significant mid RCA stenoses segmentally of 80%, 80%, and 90%. LVEDP 25 mm.  Successful percutaneous coronary intervention to the totally occluded circumflex and circumflex marginal 1 vessel treated with PTCA and stenting of the proximal to mid left circumflex vessel with ultimate insertion of a 3.0 x 38 mm Resolute DES stent postdilated to 3.25 mm, and PTCA of the totally occluded OM1 vessel with restoration of  TIMI-3 flow and residual stenosis of 0 in a small caliber vessel.  Cardiac catheterization 09/15/2017  Dist LAD lesion is 85% stenosed.  Non-stenotic Ost 1st Mrg lesion was previously treated.  Previously placed Ost Cx to Prox Cx stent (unknown type) is widely patent.  Previously placed Mid Cx stent (unknown type) is widely patent.  Prox RCA lesion is 80% stenosed.  Mid RCA-1 lesion is 80% stenosed.  Mid RCA-2 lesion is 90% stenosed.  A drug-eluting stent was successfully placed using a STENT SYNERGY DES 2.5X38 which covered all of the lesions.  Post intervention, there is a 0% residual stenosis in all fo the areas.     Recommend uninterrupted dual antiplatelet therapy with Aspirin 81mg  daily and Ticagrelor 90mg  twice daily for a minimum of 12 months (ACS - Class I recommendation).  Diagnostic Dominance: Right  Intervention     Assessment & Plan   1.  Coronary artery disease-status post PCI 09/13/2017 and 09/15/2017.  He received  DESx2 and PCA.  Details shown above.  No chest pain today. Continue aspirin 81 mg tablet daily Continue atorvastatin 80 mg tablet daily Continue Lisinopril 10 mg tablet daily Continue metoprolol tartrate 12.5 mg twice daily Continue nitroglycerin 0.4 mg sublingual tablet as needed  Essential hypertension-BP today 124/78.  Well-controlled at home Continue lisinopril 10 mg tablet daily Continue metoprolol tartrate 12.5 mg tablet twice daily Heart healthy low-sodium diet-salty 6 given Increase physical activity as tolerated Order BMP  Hyperlipidemia-LDL 68 Continue atorvastatin 80 mg tablet daily Heart healthy low-sodium high-fiber diet Increase physical activity as tolerated Order direct LDL  Preoperative cardiac evaluation-     Primary Cardiologist: [name], MD  Chart reviewed as part of pre-operative protocol coverage. Given past medical history and time since last visit, based on ACC/AHA guidelines, LUDOVIC WITTMAN would  be at acceptable risk for the planned procedure without further cardiovascular testing.   He has an RCRI class 2 risk of 0.9% of a major cardiac event.  He is able to  complete more than 4 METS of physical activity.  I will route this recommendation to the requesting party via Epic fax function and remove from pre-op pool.  Please call with questions.  Disposition: Follow-up with Dr. Stanford Breed in 6 months.  Jossie Ng. [facility] [address] [address] Office [phone] Fax [phone]

## 2019-02-18 NOTE — Progress Notes (Signed)
Spoke with pam at [facility] urology and made aware patient needs cardiac visit prior to surgery per jill mcdaniel np note 01-20-2019 epic.

## 2019-02-18 NOTE — Progress Notes (Addendum)
Anesthesia Review:  PCP: Cardiologist : Chest x-ray : EKG : Echo : Cardiac Cath :  Sleep Study/ CPAP : Fasting Blood Sugar :      / Checks Blood Sugar -- times a day:   Blood Thinner/ Instructions /Last Dose: ASA / Instructions/ Last Dose :   Anesthesia : history of mi, seeing luke kilroy pa for cardiac clearance 02-19-2019 at 800 am.ADDENDUM : cardiac clearance jesse cleaver pa 02-19-2019 epic  [id number] ybanez Cardiologist :dr Kathyrn Drown 06-08-2018 chart/epic Chest x-ray :10-22-2018 chart/epic EKG :none Echo :09-14-2017 chart/epic Cardiac Cath : 09-15-17 chart/epic Sleep Study/ CPAP : Fasting Blood Sugar :      / Checks Blood Sugar -- times a day:   Blood Thinner/ Instructions /Last Dose: ASA / Instructions/ Last Dose :   Patient denies shortness of breath, chest pain, fever, and cough at this phone interview.

## 2019-02-19 ENCOUNTER — Other Ambulatory Visit: Payer: Self-pay

## 2019-02-19 ENCOUNTER — Ambulatory Visit (INDEPENDENT_AMBULATORY_CARE_PROVIDER_SITE_OTHER): Payer: Medicare HMO | Admitting: General Practice

## 2019-02-19 ENCOUNTER — Encounter: Payer: Self-pay | Admitting: General Practice

## 2019-02-19 ENCOUNTER — Ambulatory Visit: Payer: Medicare HMO | Admitting: Cardiology

## 2019-02-19 VITALS — BP 124/78 | HR 57 | Ht 70.0 in | Wt 266.2 lb

## 2019-02-19 DIAGNOSIS — Z79899 Other long term (current) drug therapy: Secondary | ICD-10-CM

## 2019-02-19 DIAGNOSIS — I2510 Atherosclerotic heart disease of native coronary artery without angina pectoris: Secondary | ICD-10-CM

## 2019-02-19 DIAGNOSIS — Z9861 Coronary angioplasty status: Secondary | ICD-10-CM

## 2019-02-19 DIAGNOSIS — I10 Essential (primary) hypertension: Secondary | ICD-10-CM | POA: Diagnosis not present

## 2019-02-19 DIAGNOSIS — E785 Hyperlipidemia, unspecified: Secondary | ICD-10-CM

## 2019-02-19 DIAGNOSIS — Z01810 Encounter for preprocedural cardiovascular examination: Secondary | ICD-10-CM | POA: Diagnosis not present

## 2019-02-19 LAB — BASIC METABOLIC PANEL
BUN/Creatinine Ratio: 14 (ref 9–20)
BUN: 14 mg/dL (ref 6–24)
CO2: 23 mmol/L (ref 20–29)
Calcium: 8.9 mg/dL (ref 8.7–10.2)
Chloride: 103 mmol/L (ref 96–106)
Creatinine, Ser: 0.99 mg/dL (ref 0.76–1.27)
GFR calc Af Amer: 98 mL/min/{1.73_m2} (ref >59)
GFR calc non Af Amer: 85 mL/min/{1.73_m2} (ref >59)
Glucose: 110 mg/dL — ABNORMAL HIGH (ref 65–99)
Potassium: 4.4 mmol/L (ref 3.5–5.2)
Sodium: 138 mmol/L (ref 134–144)

## 2019-02-19 LAB — LDL CHOLESTEROL, DIRECT: LDL Direct: 64 mg/dL (ref 0–99)

## 2019-02-19 LAB — NOVEL CORONAVIRUS, NAA (HOSP ORDER, SEND-OUT TO REF LAB; TAT 18-24 HRS): SARS-CoV-2, NAA: NOT DETECTED

## 2019-02-19 NOTE — Progress Notes (Signed)
Anesthesia Chart Review   Case: [id number] Date/Time: 02/22/19 1215   Procedure: PENILE PROTHESIS INFLATABLE COLOPLAST (N/A )   Anesthesia type: General   Pre-op diagnosis: ERECTILE DYSFUNCTION   Location: [place] OR ROOM 1 / [place]   Surgeons: Lucas Mallow, MD      DISCUSSION:59 y.o. former smoker (0.36 pack years) with h/o HTN, HLD, DM II, DVT 2016, CAD, STEMI 09/2017, DES to LAD and RCA, erectile dysfunction scheduled for above procedure 02/22/19 with Dr. Link Snuffer.   Pt seen by cardiology 02/18/2019 for preop evaluation.  Per OV note, "Chart reviewed as part of pre-operative protocol coverage. Given past medical history and time since last visit, based on ACC/AHA guidelines, [name] would be at acceptable risk for the planned procedure without further cardiovascular testing.   He has an RCRI class 2 risk of 0.9% of a major cardiac event.  He is able to  complete more than 4 METS of physical activity."  Anticipate pt can proceed with planned procedure barring acute status change.   VS: There were no vitals taken for this visit.  PROVIDERS: [name], MD is PCP   [name], MD is Cardiologist  LABS: Labs DOS (all labs ordered are listed, but only abnormal results are displayed)  Labs Reviewed - No data to display   IMAGES:   EKG:   CV: Cardiac Cath 09/15/2017  Dist LAD lesion is 85% stenosed.  Non-stenotic Ost 1st Mrg lesion was previously treated.  Previously placed Ost Cx to Prox Cx stent (unknown type) is widely patent.  Previously placed Mid Cx stent (unknown type) is widely patent.  Prox RCA lesion is 80% stenosed.  Mid RCA-1 lesion is 80% stenosed.  Mid RCA-2 lesion is 90% stenosed.  A drug-eluting stent was successfully placed using a STENT SYNERGY DES 2.5X38 which covered all of the lesions.  Post intervention, there is a 0% residual stenosis in all fo the areas.     Recommend uninterrupted dual antiplatelet  therapy with Aspirin 81mg  daily and Ticagrelor 90mg  twice daily for a minimum of 12 months (ACS - Class I recommendation).   Echo 09/14/2017 Study Conclusions  - Left ventricle: The cavity size was mildly dilated. Wall   thickness was normal. The estimated ejection fraction was 50%.   Basal to mid inferolateral and basal anterolateral severe   hypokinesis. Features are consistent with a pseudonormal left   ventricular filling pattern, with concomitant abnormal relaxation   and increased filling pressure (grade 2 diastolic dysfunction). - Aortic valve: There was no stenosis. - Mitral valve: There was trivial regurgitation. - Right ventricle: The cavity size was normal. Systolic function   was normal. - Pulmonary arteries: No complete TR doppler jet so unable to   estimate PA systolic pressure. - Inferior vena cava: The vessel was normal in size. The   respirophasic diameter changes were in the normal range (>= 50%),   consistent with normal central venous pressure.  Impressions:  - Mildly dilated LV with EF 50%. Wall motion abnormalities as noted   above. Normal RV size and systolic function. No significant   valvular abnormalities. Past Medical History:  Diagnosis Date  . Anxiety   . Arthritis    "neck, shoulders, lower back" (09/15/2017)  . Carpal tunnel syndrome   . Chronic back pain    "neck, lower back" (09/15/2017)  . Depression   . DVT (deep venous thrombosis) ([place]) ~ 2016   following prostate surgery, treated with course  of blood thinners.no blood clot found 2nd [place]  . Erectile dysfunction   . Fibromyalgia   . Former tobacco use   . Heart murmur    "born w/one but it closed"  . Hyperlipidemia   . Hypertension   . Positive TB test yrs ago age 19's  . Prostate cancer ([place]) 2016   a. s/p prostate surgery.  . Sciatica   . Spinal stenosis   . STEMI (ST elevation myocardial infarction) ([place]) 09/13/2017   PCI/DESx1 to the LAD, with staged intervention DESx1 to the  RCA, normal EF  . Type II diabetes mellitus ([place])     Past Surgical History:  Procedure Laterality Date  . ANTERIOR CERVICAL DECOMP/DISCECTOMY FUSION  03/2015  . APPENDECTOMY  1995  . BILATERAL CARPAL TUNNEL RELEASE Bilateral 2017   "30 days apart"  . CORONARY ANGIOGRAPHY N/A 09/15/2017   Procedure: CORONARY ANGIOGRAPHY;  Surgeon: Jettie Booze, MD;  Location: [facility];  Service: Cardiovascular;  Laterality: N/A;  . CORONARY ANGIOPLASTY WITH STENT PLACEMENT  09/15/2017  . CORONARY STENT INTERVENTION N/A 09/15/2017   Procedure: CORONARY STENT INTERVENTION;  Surgeon: Jettie Booze, MD;  Location: [facility];  Service: Cardiovascular;  Laterality: N/A;  . CORONARY/GRAFT ACUTE MI REVASCULARIZATION N/A 09/13/2017   Procedure: Coronary/Graft Acute MI Revascularization;  Surgeon: Troy Sine, MD;  Location: [place] [facility];  Service: Cardiovascular;  Laterality: N/A;  . LEFT HEART CATH AND CORONARY ANGIOGRAPHY N/A 09/13/2017   Procedure: LEFT HEART CATH AND CORONARY ANGIOGRAPHY;  Surgeon: Troy Sine, MD;  Location: [facility];  Service: Cardiovascular;  Laterality: N/A;  . OTHER SURGICAL HISTORY  2003   "genital warts removed"  . PROSTATECTOMY  2016    MEDICATIONS: No current facility-administered medications for this encounter.   . docusate sodium (COLACE) 100 MG capsule  . Misc Natural Products (GLUCOSAMINE CHOND CMP ADVANCED PO)  . UNABLE TO FIND  . aspirin EC 81 MG tablet  . atorvastatin (LIPITOR) 80 MG tablet  . gabapentin (NEURONTIN) 300 MG capsule  . glipiZIDE (GLUCOTROL XL) 5 MG 24 hr tablet  . lisinopril (PRINIVIL,ZESTRIL) 10 MG tablet  . metFORMIN (GLUCOPHAGE) 500 MG tablet  . metoprolol tartrate (LOPRESSOR) 25 MG tablet  . nitroGLYCERIN (NITROSTAT) 0.4 MG SL tablet  . sildenafil (VIAGRA) 100 MG tablet    Maia Plan [facility] Pre-Surgical Testing [phone] 02/19/19  12:27 PM

## 2019-02-19 NOTE — Patient Instructions (Addendum)
Medication Instructions:  The current medical regimen is effective;  continue present plan and medications as directed. Please refer to the Current Medication list given to you today. If you need a refill on your cardiac medications before your next appointment, please call your pharmacy.  Follow-Up: IN 6 months Please call our office 2 months in advance, MAY 2021 to schedule this July 2021 appointment. Either In Person or Virtual You may see [name], MD or one of the following Advanced Practice Providers on your designated Care Team:  Coletta Memos, [place], PA-C  [place], [place].    LAB: BMET AND DIRECT LDL TODAY HERE IN OUR OFFICE AT [facility]  Special Instructions: CLEARED FOR UROLOGY PROCEDURE  Reduce your risk of getting COVID-19 With your heart disease it is especially important for people at increased risk of severe illness from COVID-19, and those who live with them, to protect themselves from getting COVID-19. The best way to protect yourself and to help reduce the spread of the virus that causes COVID-19 is to: Marland Kitchen Limit your interactions with other people as much as possible. . Take precautions to prevent getting COVID-19 when you do interact with others. If you start feeling sick and think you may have COVID-19, get in touch with your healthcare provider within 24  At [facility], you and your health needs are our priority.  As part of our continuing mission to provide you with exceptional heart care, we have created designated Provider Care Teams.  These Care Teams include your primary Cardiologist (physician) and Advanced Practice Providers (APPs -  Physician Assistants and Nurse Practitioners) who all work together to provide you with the care you need, when you need it.  Thank you for choosing [facility] at [facility]!!

## 2019-02-22 ENCOUNTER — Ambulatory Visit (HOSPITAL_BASED_OUTPATIENT_CLINIC_OR_DEPARTMENT_OTHER): Payer: Medicare HMO | Admitting: Physician Assistant

## 2019-02-22 ENCOUNTER — Encounter (HOSPITAL_BASED_OUTPATIENT_CLINIC_OR_DEPARTMENT_OTHER): Payer: Self-pay | Admitting: Urology

## 2019-02-22 ENCOUNTER — Ambulatory Visit (HOSPITAL_BASED_OUTPATIENT_CLINIC_OR_DEPARTMENT_OTHER)
Admission: RE | Admit: 2019-02-22 | Discharge: 2019-02-23 | Disposition: A | Discharge status: Home / Self Care | Payer: Medicare HMO | Attending: Urology | Admitting: Urology

## 2019-02-22 ENCOUNTER — Encounter (HOSPITAL_BASED_OUTPATIENT_CLINIC_OR_DEPARTMENT_OTHER)
Admission: RE | Disposition: A | Discharge status: Home / Self Care | Payer: Self-pay | Source: Home / Self Care | Attending: Urology

## 2019-02-22 DIAGNOSIS — E669 Obesity, unspecified: Secondary | ICD-10-CM | POA: Insufficient documentation

## 2019-02-22 DIAGNOSIS — Z981 Arthrodesis status: Secondary | ICD-10-CM | POA: Diagnosis not present

## 2019-02-22 DIAGNOSIS — F419 Anxiety disorder, unspecified: Secondary | ICD-10-CM | POA: Insufficient documentation

## 2019-02-22 DIAGNOSIS — M797 Fibromyalgia: Secondary | ICD-10-CM | POA: Diagnosis not present

## 2019-02-22 DIAGNOSIS — M1990 Unspecified osteoarthritis, unspecified site: Secondary | ICD-10-CM | POA: Insufficient documentation

## 2019-02-22 DIAGNOSIS — E119 Type 2 diabetes mellitus without complications: Secondary | ICD-10-CM | POA: Diagnosis not present

## 2019-02-22 DIAGNOSIS — I2510 Atherosclerotic heart disease of native coronary artery without angina pectoris: Secondary | ICD-10-CM | POA: Diagnosis not present

## 2019-02-22 DIAGNOSIS — N393 Stress incontinence (female) (male): Secondary | ICD-10-CM | POA: Diagnosis not present

## 2019-02-22 DIAGNOSIS — Z87891 Personal history of nicotine dependence: Secondary | ICD-10-CM | POA: Insufficient documentation

## 2019-02-22 DIAGNOSIS — I252 Old myocardial infarction: Secondary | ICD-10-CM | POA: Insufficient documentation

## 2019-02-22 DIAGNOSIS — E785 Hyperlipidemia, unspecified: Secondary | ICD-10-CM | POA: Insufficient documentation

## 2019-02-22 DIAGNOSIS — Z955 Presence of coronary angioplasty implant and graft: Secondary | ICD-10-CM | POA: Diagnosis not present

## 2019-02-22 DIAGNOSIS — Z923 Personal history of irradiation: Secondary | ICD-10-CM | POA: Diagnosis not present

## 2019-02-22 DIAGNOSIS — Z8546 Personal history of malignant neoplasm of prostate: Secondary | ICD-10-CM | POA: Diagnosis not present

## 2019-02-22 DIAGNOSIS — I10 Essential (primary) hypertension: Secondary | ICD-10-CM | POA: Insufficient documentation

## 2019-02-22 DIAGNOSIS — F329 Major depressive disorder, single episode, unspecified: Secondary | ICD-10-CM | POA: Insufficient documentation

## 2019-02-22 DIAGNOSIS — Z9079 Acquired absence of other genital organ(s): Secondary | ICD-10-CM | POA: Diagnosis not present

## 2019-02-22 DIAGNOSIS — Z86718 Personal history of other venous thrombosis and embolism: Secondary | ICD-10-CM | POA: Insufficient documentation

## 2019-02-22 DIAGNOSIS — N5231 Erectile dysfunction following radical prostatectomy: Secondary | ICD-10-CM | POA: Insufficient documentation

## 2019-02-22 DIAGNOSIS — Z6836 Body mass index (BMI) 36.0-36.9, adult: Secondary | ICD-10-CM | POA: Insufficient documentation

## 2019-02-22 DIAGNOSIS — Z7984 Long term (current) use of oral hypoglycemic drugs: Secondary | ICD-10-CM | POA: Insufficient documentation

## 2019-02-22 DIAGNOSIS — Z7982 Long term (current) use of aspirin: Secondary | ICD-10-CM | POA: Diagnosis not present

## 2019-02-22 DIAGNOSIS — N529 Male erectile dysfunction, unspecified: Secondary | ICD-10-CM | POA: Diagnosis present

## 2019-02-22 DIAGNOSIS — Z79899 Other long term (current) drug therapy: Secondary | ICD-10-CM | POA: Insufficient documentation

## 2019-02-22 HISTORY — PX: PENILE PROSTHESIS IMPLANT: SHX240

## 2019-02-22 LAB — POCT I-STAT, CHEM 8
BUN: 15 mg/dL (ref 6–20)
Calcium, Ion: 1.19 mmol/L (ref 1.15–1.40)
Chloride: 103 mmol/L (ref 98–111)
Creatinine, Ser: 1 mg/dL (ref 0.61–1.24)
Glucose, Bld: 120 mg/dL — ABNORMAL HIGH (ref 70–99)
HCT: 39 % (ref 39.0–52.0)
Hemoglobin: 13.3 g/dL (ref 13.0–17.0)
Potassium: 4.1 mmol/L (ref 3.5–5.1)
Sodium: 140 mmol/L (ref 135–145)
TCO2: 27 mmol/L (ref 22–32)

## 2019-02-22 LAB — GLUCOSE, CAPILLARY
Glucose-Capillary: 157 mg/dL — ABNORMAL HIGH (ref 70–99)
Glucose-Capillary: 161 mg/dL — ABNORMAL HIGH (ref 70–99)

## 2019-02-22 SURGERY — INSERTION, PENILE PROSTHESIS, INFLATABLE
Anesthesia: General | Site: Penis

## 2019-02-22 MED ORDER — SODIUM CHLORIDE 0.9 % IV SOLN
INTRAVENOUS | Status: DC | PRN
  Administered 2019-02-22: 200 mL

## 2019-02-22 MED ORDER — HYDROMORPHONE HCL 1 MG/ML IJ SOLN
0.5000 mg | INTRAMUSCULAR | Status: DC | PRN
  Filled 2019-02-22: qty 1

## 2019-02-22 MED ORDER — ONDANSETRON HCL 4 MG/2ML IJ SOLN
INTRAMUSCULAR | Status: AC
  Filled 2019-02-22: qty 2

## 2019-02-22 MED ORDER — PROPOFOL 10 MG/ML IV BOLUS
INTRAVENOUS | Status: DC | PRN
  Administered 2019-02-22: 250 mg via INTRAVENOUS

## 2019-02-22 MED ORDER — PROMETHAZINE HCL 25 MG/ML IJ SOLN
6.2500 mg | INTRAMUSCULAR | Status: DC | PRN
  Filled 2019-02-22: qty 1

## 2019-02-22 MED ORDER — ONDANSETRON HCL 4 MG/2ML IJ SOLN
4.0000 mg | INTRAMUSCULAR | Status: DC | PRN
  Filled 2019-02-22: qty 2

## 2019-02-22 MED ORDER — GABAPENTIN 300 MG PO CAPS
300.0000 mg | ORAL_CAPSULE | Freq: Three times a day (TID) | ORAL | Status: DC
  Administered 2019-02-22 – 2019-02-23 (×3): 300 mg via ORAL
  Filled 2019-02-22: qty 1

## 2019-02-22 MED ORDER — LACTATED RINGERS IV SOLN
INTRAVENOUS | Status: DC
  Filled 2019-02-22: qty 1000

## 2019-02-22 MED ORDER — FENTANYL CITRATE (PF) 100 MCG/2ML IJ SOLN
INTRAMUSCULAR | Status: DC | PRN
  Administered 2019-02-22 (×7): 50 ug via INTRAVENOUS

## 2019-02-22 MED ORDER — VANCOMYCIN HCL IN DEXTROSE 1-5 GM/200ML-% IV SOLN
1000.0000 mg | INTRAVENOUS | Status: AC
  Administered 2019-02-22: 11:00:00 1000 mg via INTRAVENOUS
  Filled 2019-02-22: qty 200

## 2019-02-22 MED ORDER — FENTANYL CITRATE (PF) 100 MCG/2ML IJ SOLN
INTRAMUSCULAR | Status: AC
  Filled 2019-02-22: qty 2

## 2019-02-22 MED ORDER — DEXAMETHASONE SODIUM PHOSPHATE 10 MG/ML IJ SOLN
INTRAMUSCULAR | Status: AC
  Filled 2019-02-22: qty 1

## 2019-02-22 MED ORDER — HYDROCODONE-ACETAMINOPHEN 5-325 MG PO TABS: 1.0000 | ORAL_TABLET | ORAL | 0 refills | Status: DC | PRN

## 2019-02-22 MED ORDER — GABAPENTIN 300 MG PO CAPS
ORAL_CAPSULE | ORAL | Status: AC
  Filled 2019-02-22: qty 1

## 2019-02-22 MED ORDER — PROPOFOL 10 MG/ML IV BOLUS
INTRAVENOUS | Status: AC
  Filled 2019-02-22: qty 20

## 2019-02-22 MED ORDER — SODIUM CHLORIDE 0.9 % IV SOLN
INTRAVENOUS | Status: DC | PRN
  Administered 2019-02-22: 500 mL

## 2019-02-22 MED ORDER — ACETAMINOPHEN 500 MG PO TABS
1000.0000 mg | ORAL_TABLET | Freq: Four times a day (QID) | ORAL | Status: DC
  Administered 2019-02-22 – 2019-02-23 (×3): 1000 mg via ORAL
  Filled 2019-02-22: qty 2

## 2019-02-22 MED ORDER — ONDANSETRON HCL 4 MG/2ML IJ SOLN
INTRAMUSCULAR | Status: DC | PRN
  Administered 2019-02-22: 4 mg via INTRAVENOUS

## 2019-02-22 MED ORDER — MIDAZOLAM HCL 5 MG/5ML IJ SOLN
INTRAMUSCULAR | Status: DC | PRN
  Administered 2019-02-22: 2 mg via INTRAVENOUS

## 2019-02-22 MED ORDER — NITROGLYCERIN 0.4 MG SL SUBL
0.4000 mg | SUBLINGUAL_TABLET | SUBLINGUAL | Status: DC | PRN
  Filled 2019-02-22: qty 25

## 2019-02-22 MED ORDER — DEXAMETHASONE SODIUM PHOSPHATE 10 MG/ML IJ SOLN
INTRAMUSCULAR | Status: DC | PRN
  Administered 2019-02-22: 5 mg via INTRAVENOUS

## 2019-02-22 MED ORDER — FENTANYL CITRATE (PF) 100 MCG/2ML IJ SOLN
25.0000 ug | INTRAMUSCULAR | Status: DC | PRN
  Administered 2019-02-22 (×3): 50 ug via INTRAVENOUS
  Filled 2019-02-22: qty 1

## 2019-02-22 MED ORDER — INSULIN ASPART 100 UNIT/ML ~~LOC~~ SOLN
0.0000 [IU] | Freq: Every day | SUBCUTANEOUS | Status: DC
  Filled 2019-02-22: qty 0.05

## 2019-02-22 MED ORDER — ACETAMINOPHEN 500 MG PO TABS
ORAL_TABLET | ORAL | Status: AC
  Filled 2019-02-22: qty 2

## 2019-02-22 MED ORDER — VANCOMYCIN HCL IN DEXTROSE 1-5 GM/200ML-% IV SOLN
INTRAVENOUS | Status: AC
  Filled 2019-02-22: qty 200

## 2019-02-22 MED ORDER — SODIUM CHLORIDE 0.9 % IV SOLN
INTRAVENOUS | Status: DC
  Filled 2019-02-22 (×2): qty 600

## 2019-02-22 MED ORDER — OXYCODONE HCL 5 MG PO TABS
5.0000 mg | ORAL_TABLET | ORAL | Status: DC | PRN
  Administered 2019-02-22 – 2019-02-23 (×3): 5 mg via ORAL
  Filled 2019-02-22: qty 1

## 2019-02-22 MED ORDER — KETOROLAC TROMETHAMINE 30 MG/ML IJ SOLN
30.0000 mg | Freq: Once | INTRAMUSCULAR | Status: DC | PRN
  Filled 2019-02-22: qty 1

## 2019-02-22 MED ORDER — ATORVASTATIN CALCIUM 80 MG PO TABS
80.0000 mg | ORAL_TABLET | Freq: Every day | ORAL | Status: DC
  Administered 2019-02-23: 80 mg via ORAL
  Filled 2019-02-22 (×2): qty 1

## 2019-02-22 MED ORDER — LISINOPRIL 10 MG PO TABS
10.0000 mg | ORAL_TABLET | Freq: Every day | ORAL | Status: DC
  Administered 2019-02-23: 10 mg via ORAL
  Filled 2019-02-22 (×2): qty 1

## 2019-02-22 MED ORDER — MIDAZOLAM HCL 2 MG/2ML IJ SOLN
INTRAMUSCULAR | Status: AC
  Filled 2019-02-22: qty 2

## 2019-02-22 MED ORDER — INSULIN ASPART 100 UNIT/ML ~~LOC~~ SOLN
2.0000 [IU] | Freq: Three times a day (TID) | SUBCUTANEOUS | Status: DC
  Administered 2019-02-22 – 2019-02-23 (×2): 2 [IU] via SUBCUTANEOUS
  Filled 2019-02-22: qty 0.02

## 2019-02-22 MED ORDER — OXYBUTYNIN CHLORIDE 5 MG PO TABS
5.0000 mg | ORAL_TABLET | Freq: Three times a day (TID) | ORAL | Status: DC | PRN
  Filled 2019-02-22: qty 1

## 2019-02-22 MED ORDER — DOCUSATE SODIUM 100 MG PO CAPS
100.0000 mg | ORAL_CAPSULE | Freq: Two times a day (BID) | ORAL | Status: DC
  Administered 2019-02-22 – 2019-02-23 (×2): 100 mg via ORAL
  Filled 2019-02-22: qty 1

## 2019-02-22 MED ORDER — GENTAMICIN SULFATE 40 MG/ML IJ SOLN
460.0000 mg | INTRAVENOUS | Status: AC
  Administered 2019-02-22: 460 mg via INTRAVENOUS
  Filled 2019-02-22 (×2): qty 11.5

## 2019-02-22 MED ORDER — OXYCODONE HCL 5 MG PO TABS
ORAL_TABLET | ORAL | Status: AC
  Filled 2019-02-22: qty 1

## 2019-02-22 MED ORDER — DOCUSATE SODIUM 100 MG PO CAPS
ORAL_CAPSULE | ORAL | Status: AC
  Filled 2019-02-22: qty 1

## 2019-02-22 MED ORDER — DIPHENHYDRAMINE HCL 12.5 MG/5ML PO ELIX
12.5000 mg | ORAL_SOLUTION | Freq: Four times a day (QID) | ORAL | Status: DC | PRN
  Filled 2019-02-22: qty 5

## 2019-02-22 MED ORDER — DIPHENHYDRAMINE HCL 50 MG/ML IJ SOLN
12.5000 mg | Freq: Four times a day (QID) | INTRAMUSCULAR | Status: DC | PRN
  Filled 2019-02-22: qty 0.25

## 2019-02-22 MED ORDER — INSULIN ASPART 100 UNIT/ML ~~LOC~~ SOLN
0.0000 [IU] | Freq: Three times a day (TID) | SUBCUTANEOUS | Status: DC
  Administered 2019-02-22: 3 [IU] via SUBCUTANEOUS
  Administered 2019-02-23: 2 [IU] via SUBCUTANEOUS
  Filled 2019-02-22: qty 0.15

## 2019-02-22 MED ORDER — LIDOCAINE 2% (20 MG/ML) 5 ML SYRINGE
INTRAMUSCULAR | Status: DC | PRN
  Administered 2019-02-22: 100 mg via INTRAVENOUS

## 2019-02-22 MED ORDER — FENTANYL CITRATE (PF) 250 MCG/5ML IJ SOLN
INTRAMUSCULAR | Status: AC
  Filled 2019-02-22: qty 5

## 2019-02-22 MED ORDER — METOPROLOL TARTRATE 12.5 MG HALF TABLET
12.5000 mg | ORAL_TABLET | Freq: Two times a day (BID) | ORAL | Status: DC
  Administered 2019-02-22 – 2019-02-23 (×2): 12.5 mg via ORAL
  Filled 2019-02-22 (×3): qty 1

## 2019-02-22 MED ORDER — ACETAMINOPHEN 500 MG PO TABS
1000.0000 mg | ORAL_TABLET | Freq: Once | ORAL | Status: AC
  Administered 2019-02-22: 1000 mg via ORAL
  Filled 2019-02-22: qty 2

## 2019-02-22 MED ORDER — SODIUM CHLORIDE 0.9 % IV SOLN
INTRAVENOUS | Status: DC
  Filled 2019-02-22 (×2): qty 1000

## 2019-02-22 MED ORDER — LIDOCAINE 2% (20 MG/ML) 5 ML SYRINGE
INTRAMUSCULAR | Status: AC
  Filled 2019-02-22: qty 5

## 2019-02-22 MED ORDER — SULFAMETHOXAZOLE-TRIMETHOPRIM 800-160 MG PO TABS
1.0000 | ORAL_TABLET | Freq: Two times a day (BID) | ORAL | Status: DC
  Administered 2019-02-22 – 2019-02-23 (×2): 1 via ORAL
  Filled 2019-02-22 (×3): qty 1

## 2019-02-22 SURGICAL SUPPLY — 53 items
BAG DECANTER FOR FLEXI CONT (MISCELLANEOUS) ×3 IMPLANT
BAG URINE DRAIN 2000ML AR STRL (UROLOGICAL SUPPLIES) ×3 IMPLANT
BANDAGE COBAN STERILE 2 (GAUZE/BANDAGES/DRESSINGS) IMPLANT
BLADE HEX COATED 2.75 (ELECTRODE) ×3 IMPLANT
BLADE SURG 15 STRL LF DISP TIS (BLADE) ×1 IMPLANT
BLADE SURG 15 STRL SS (BLADE) ×2
BNDG COHESIVE 2X5 TAN STRL LF (GAUZE/BANDAGES/DRESSINGS) ×3 IMPLANT
BNDG GAUZE ELAST 4 BULKY (GAUZE/BANDAGES/DRESSINGS) ×3 IMPLANT
CATH FOLEY 2WAY SLVR  5CC 16FR (CATHETERS) ×2
CATH FOLEY 2WAY SLVR 5CC 16FR (CATHETERS) ×1 IMPLANT
CHLORAPREP W/TINT 26 (MISCELLANEOUS) ×6 IMPLANT
COVER MAYO STAND STRL (DRAPES) ×6 IMPLANT
COVER TABLE BACK 60X90 (DRAPES) ×3 IMPLANT
COVER WAND RF STERILE (DRAPES) ×3 IMPLANT
DERMABOND ADVANCED (GAUZE/BANDAGES/DRESSINGS) ×2
DERMABOND ADVANCED .7 DNX12 (GAUZE/BANDAGES/DRESSINGS) ×1 IMPLANT
DRAIN CHANNEL 10F 3/8 F FF (DRAIN) ×3 IMPLANT
DRAPE INCISE IOBAN 66X45 STRL (DRAPES) ×3 IMPLANT
DRAPE LAPAROTOMY 100X72 PEDS (DRAPES) ×3 IMPLANT
EVACUATOR DRAINAGE 7X20 100CC (MISCELLANEOUS) ×1 IMPLANT
EVACUATOR SILICONE 100CC (DRAIN) ×3 IMPLANT
EVACUATOR SILICONE 100CC (MISCELLANEOUS) ×2
GAUZE SPONGE 4X4 12PLY STRL LF (GAUZE/BANDAGES/DRESSINGS) ×3 IMPLANT
GLOVE BIO SURGEON STRL SZ7.5 (GLOVE) ×9 IMPLANT
GOWN STRL REUS W/TWL XL LVL3 (GOWN DISPOSABLE) ×3 IMPLANT
HOLDER FOLEY CATH W/STRAP (MISCELLANEOUS) ×3 IMPLANT
KIT TITAN ASSEMBLY (Erectile Restoration) ×2 IMPLANT
KIT TITAN ASSEMBLY STANDARD (Erectile Restoration) ×1 IMPLANT
KIT TITAN ASSEMBLY STD (Erectile Restoration) ×1 IMPLANT
NEEDLE HYPO 22GX1.5 SAFETY (NEEDLE) ×3 IMPLANT
NS IRRIG 1000ML POUR BTL (IV SOLUTION) ×3 IMPLANT
PACK BASIN DAY SURGERY FS (CUSTOM PROCEDURE TRAY) ×3 IMPLANT
PENCIL BUTTON HOLSTER BLD 10FT (ELECTRODE) ×3 IMPLANT
PLUG CATH AND CAP STER (CATHETERS) ×3 IMPLANT
PUMP SET TILAN CYL SCROTAL 20C ×3 IMPLANT
RESERVOIR TITAN 12.5CC W/VLV ×3 IMPLANT
RETRACTOR WILSON SYSTEM (INSTRUMENTS) IMPLANT
SUPPORT SCROTAL LG STRP (MISCELLANEOUS) ×2 IMPLANT
SUPPORTER ATHLETIC LG (MISCELLANEOUS) ×1
SUT MNCRL AB 3-0 PS2 18 (SUTURE) ×3 IMPLANT
SUT VIC AB 2-0 SH 27 (SUTURE) ×12
SUT VIC AB 2-0 SH 27XBRD (SUTURE) ×6 IMPLANT
SUT VIC AB 4-0 PS2 18 (SUTURE) IMPLANT
SYR 10ML LL (SYRINGE) ×6 IMPLANT
SYR 50ML LL SCALE MARK (SYRINGE) ×6 IMPLANT
SYR BULB IRRIGATION 50ML (SYRINGE) ×3 IMPLANT
SYR CONTROL 10ML LL (SYRINGE) ×3 IMPLANT
TOWEL OR 17X26 10 PK STRL BLUE (TOWEL DISPOSABLE) ×6 IMPLANT
TRAY DSU PREP LF (CUSTOM PROCEDURE TRAY) ×3 IMPLANT
TUBE CONNECTING 12'X1/4 (SUCTIONS) ×1
TUBE CONNECTING 12X1/4 (SUCTIONS) ×2 IMPLANT
WATER STERILE IRR 1000ML POUR (IV SOLUTION) ×9 IMPLANT
YANKAUER SUCT BULB TIP NO VENT (SUCTIONS) ×6 IMPLANT

## 2019-02-22 NOTE — Op Note (Signed)
Operative Note  Preoperative diagnosis:  1.  Erectile dysfunction  Postoperative diagnosis: 1.  Erectile dysfunction  Procedure(s): 1.  Insertion of Coloplast Titan inflatable penile prosthesis  Surgeon: Link Snuffer, MD  Assistants: Ysidro Evert  Anesthesia: General  Complications: None immediate  EBL: 100 cc  Specimens: 1.  None  Drains/Catheters: 1.  Foley catheter 2.  JP drain  Intraoperative findings: Corporal length was the following: Right proximal 12 cm, right distal 10.5 cm, left proximal 12 cm, left distal 10.5 cm.  Therefore a 20 cm + 2.5 cm rear tip extender was placed.  The reservoir was placed ectopic on the right  Indication: 59 year old male with history of prostate cancer status post prostatectomy followed by radiation who also has controlled diabetes.  He has erectile dysfunction and desires the above operation.  Description of procedure:  The patient was identified and consent was obtained.  The patient was taken to the operating room and placed in the supine position.  The patient was placed under general anesthesia.  Perioperative antibiotics were administered. Patient was prepped and draped in a standard sterile fashion and a timeout was performed.  A Foley catheter was placed.  A 4 cm transverse penoscrotal incision was made with Bovie electrocautery on cut setting.  Blunt dissection clear tissue away from the corpora bilaterally.  Sharp dissection was used to carefully take down the septum taking great care to avoid the urethra.  Ring retractor was used to assist with retraction.  Additional sharp dissection was performed bilaterally to expose the corpora.  Spot electrocautery was used for hemostasis.  2 separate 2-0 Vicryl sutures were placed on each side in the proximal corpora.  An incision was made in between each of these bilaterally to make our corporotomy.  First Metzenbaum scissors were used to carefully dilate proximally and distally on each side.   Brooks dilators were then used from a size of 11-12 to carefully dilate proximally and distally.  There was minimal resistance with dilation.  We then proceeded with measurement with the measurements noted above.  We irrigated the corpora with antibiotic solution and there was no evidence of any urethral injury.  We inserted the [facility] dilators proximally to confirm there was no crossover and also did this distally as well.  There was no evidence of any crossover.  We thoroughly irrigated the wound.  We bluntly developed the space for a reservoir ectopically. At this point in time all members of the surgical team changed their over gloves.  Sterile towel was placed inferior to the scrotum.  The device was prepared.  The sizing of the device is noted above.  First, the reservoir was placed in the right ectopic space and 80 cc of normal saline filled the reservoir.  We then used the furlough device to pierce the distal glands with the [facility] needle.  On each side we first inserted the proximal portion of the cylinder into the proximal corpora followed by situation of the distal portion and the distal corpora and it sat nicely within the corpora.  We then test inflated the device and noted it to be in excellent position and it was even on each side.  There was a slight ST deformity.  There was no significant curvature.  We then secured down the stay sutures to close the corporotomy.  Once bilateral corporotomies were closed, excess tubing was trimmed and properly connected together.  Copious antibiotic solution was used to irrigate the area.  A dartos pouch on the anterior  scrotum was then created and the pump was placed within this.  The dartos tissue was closed with a pursestring stitch to keep the pump in place.  A JP drain was placed with the tubing coming out the left side.  This was secured down with a drain stitch.  The dartos tissue was closed with running 2-0 Vicryl to cover the tubing.  A second layer of  dartos was then closed with 2-0 Vicryl in a running fashion to close the incision.  The skin was then closed with running 4-0 Monocryl and Dermabond.  The prosthetic was inflated about 70%.  The scrotum and penis were then wrapped with a mummy wrap dressing.  This concluded the operation.  The patient tolerated the procedure well and was stable postoperatively.  Plan: The patient will be monitored overnight.  His Foley catheter will be removed tomorrow and the prosthesis will be deflated.  After the drain is removed he will be discharged with 7 days of p.o. antibiotic.  He will return in 1 week for a postoperative check.

## 2019-02-22 NOTE — Anesthesia Preprocedure Evaluation (Addendum)
Anesthesia Evaluation  Patient identified by MRN, date of birth, ID band  Reviewed: Allergy & Precautions, NPO status , Patient's Chart, lab work & pertinent test results  Airway Mallampati: III  TM Distance: >3 FB Neck ROM: Full    Dental  (+) Lower Dentures   Pulmonary former smoker,    Pulmonary exam normal breath sounds clear to auscultation       Cardiovascular hypertension, Pt. on medications and Pt. on home beta blockers + CAD, + Past MI, + Cardiac Stents (x 2 in 2019) and + DVT  Normal cardiovascular exam Rhythm:Regular Rate:Normal  ECG: SB, rate 68  Sees cardiologist Water quality scientist)   Neuro/Psych PSYCHIATRIC DISORDERS Anxiety Depression negative neurological ROS     GI/Hepatic negative GI ROS, Neg liver ROS,   Endo/Other  diabetes, Oral Hypoglycemic Agents  Renal/GU negative Renal ROS     Musculoskeletal  (+) Arthritis , Fibromyalgia -Chronic back pain   Abdominal (+) + obese,   Peds  Hematology HLD   Anesthesia Other Findings ERECTILE DYSFUNCTION  Reproductive/Obstetrics                            Anesthesia Physical Anesthesia Plan  ASA: III  Anesthesia Plan: General   Post-op Pain Management:    Induction: Intravenous  PONV Risk Score and Plan: 3 and Ondansetron, Dexamethasone, Midazolam and Treatment may vary due to age or medical condition  Airway Management Planned: LMA  Additional Equipment:   Intra-op Plan:   Post-operative Plan: Extubation in OR  Informed Consent: I have reviewed the patients History and Physical, chart, labs and discussed the procedure including the risks, benefits and alternatives for the proposed anesthesia with the patient or authorized representative who has indicated his/her understanding and acceptance.     Dental advisory given  Plan Discussed with: CRNA  Anesthesia Plan Comments:        Anesthesia Quick Evaluation

## 2019-02-22 NOTE — H&P (Signed)
CC/HPI: CC: Post-prostatectomy erectile dysfunction  HPI:  12/04/2017  59 year old male status post robotic-assisted laparoscopic prostatectomy by Dr. Don Broach who was at [facility] at the time. Pathology revealed Gleason 3+4 adenocarcinoma of the prostate PT 2C with positive margins. For this reason, he underwent adjuvant radiation which was completed in 2016. Last PSA was 0.01 in May 2019. Patient is a diabetic and reports that his last hemoglobin A1c was 6.4. The patient has failed injections as well as oral PDE 5 inhibitors. He did not have good response with these. He does have mild stress urinary incontinence with cough, laugh, sneeze but does not wear any pads. He does have a history of coronary artery disease. Last myocardial infarction was in August and he had cardiac stents placed. He is currently on aspirin 81 and brilinta. His cardiologist is Dr. [name]. The patient is interested in a penile prosthetic. He has done a great deal of research on this. He was originally scheduled to have this done by Dr. Estill Dooms but has decided to come here for evaluation.   08/13/2018  In the interval, the patient had an episode of gross hematuria. CT IVP revealed a Bosniak 1. Renal cyst, but otherwise was negative. He presents today for cystoscopy. He has not had an updated PSA. He will get one today. No further gross hematuria.   09/23/2018  Since Sunday, the patient has had intermittent gross hematuria. He has passed a few clots. He is voiding fine now. He however continues with gross hematuria. Cystoscopy 6 weeks ago was negative. So was the CT IVP. Most recent PSA was 0.134 On 08/17/2018. He denies any dysuria.   12/22/2018  Patient had blood work drawn last week. PSA results are not back yet. He started hyperbaric oxygen and is continuing to undergo this. He is doing well with this. He has not had any hematuria for the past 2 weeks. He also came off his blood thinner which was cleared by his cardiologist. He is  still debating whether not to get a penile prosthesis but he and his wife are not really sexually active at this time and he wants to talk to his wife some more about this. Urinalysis is negative today. No voiding complaints. He does wear 2 pads per day which is slightly bothersome. Not enough to consider procedural intervention.     ALLERGIES: No Allergies    MEDICATIONS: Lisinopril 10 mg tablet  Metoprolol Tartrate 25 mg tablet  Aspirin Ec 81 mg tablet, delayed release  Atorvastatin Calcium 80 mg tablet  Brilinta 90 mg tablet  Buprenorphine Hcl  Buspirone Hcl 10 mg tablet  Citalopram Hbr 40 mg tablet  Colace  Gabapentin 300 mg capsule  Glipizide 5 mg tablet  Glucophage TABS Oral  Hydrocortisone Acetate  Memantine Hcl 10 mg tablet  Mupirocin 2 % ointment  Nitroglycerin 0.4 mg tablet, sublingual  Polyethylene Glycol  Provigil 100 mg tablet  Sildenafil Citrate 100 mg tablet  Sildenafil Citrate 100 mg tablet 1 tablet PO prn Take 1 tab po 1hr prior to sexual activities     GU PSH: Cystoscopy - 08/13/2018 Locm 300-399Mg /Ml Iodine,1Ml - 07/15/2018 Remove Prostate.       PSH Notes: Appendectomy   NON-GU PSH: Appendectomy - 2014 Appendectomy (laparoscopic) Carpal tunnel surgery Neck Surgery     GU PMH: Prostate Cancer - 09/23/2018 Gross hematuria - 07/14/2018 ED following radical prostatectomy - 12/04/2017 History of prostate cancer - 12/04/2017 Stress Incontinence - 12/04/2017 Elevated PSA, Elevated prostate specific antigen (PSA) -  2014      PMH Notes:  1898-02-04 00:00:00 - Note: Normal Routine History And Physical Adult  2012-03-24 15:30:51 - Note: Arthritis   NON-GU PMH: Cardiac murmur, unspecified, Murmurs - 2014 Personal history of other diseases of the circulatory system, History of hypertension - 2014 Personal history of other endocrine, nutritional and metabolic disease, History of diabetes mellitus - 2014 Anxiety Arthritis Cataract    FAMILY HISTORY: Breast  Cancer - Sister Death In The Family Father - Runs In Family Diabetes - Runs In Family Family Health Status Number - Runs In Family   SOCIAL HISTORY: Marital Status: Married Preferred Language: English; Race: Black or African American Current Smoking Status: Patient does not smoke anymore.   Tobacco Use Assessment Completed: Used Tobacco in last 30 days? Drinks 2 caffeinated drinks per day.     Notes: 3 sons, 3 daughters    REVIEW OF SYSTEMS:    GU Review Male:   Patient denies frequent urination, hard to postpone urination, burning/ pain with urination, get up at night to urinate, leakage of urine, stream starts and stops, trouble starting your stream, have to strain to urinate , erection problems, and penile pain.  Gastrointestinal (Upper):   Patient denies nausea, vomiting, and indigestion/ heartburn.  Gastrointestinal (Lower):   Patient denies diarrhea and constipation.  Constitutional:   Patient denies fever, night sweats, weight loss, and fatigue.  Skin:   Patient denies skin rash/ lesion and itching.  Eyes:   Patient denies double vision and blurred vision.  Ears/ Nose/ Throat:   Patient denies sore throat and sinus problems.  Hematologic/Lymphatic:   Patient denies swollen glands and easy bruising.  Cardiovascular:   Patient denies leg swelling and chest pains.  Respiratory:   Patient denies cough and shortness of breath.  Endocrine:   Patient denies excessive thirst.  Musculoskeletal:   Patient denies back pain and joint pain.  Neurological:   Patient denies headaches and dizziness.  Psychologic:   Patient denies depression and anxiety.   VITAL SIGNS:      12/22/2018 10:37 AM  BP 156/91 mmHg  Heart Rate 73 /min  Temperature 97.5 F / 36.3 C   MULTI-SYSTEM PHYSICAL EXAMINATION:    Constitutional: Well-nourished. No physical deformities. Normally developed. Good grooming.  Respiratory: No labored breathing, no use of accessory muscles.   Cardiovascular: Normal temperature,  adequate perfusion of extremities  Skin: No paleness, no jaundice  Neurologic / Psychiatric: Oriented to time, oriented to place, oriented to person. No depression, no anxiety, no agitation.  Gastrointestinal: No mass, no tenderness, no rigidity, obese abdomen.   Eyes: Normal conjunctivae. Normal eyelids.  Musculoskeletal: Normal gait and station of head and neck.     PAST DATA REVIEWED:  Source Of History:  Patient  Records Review:   Previous Doctor Records, Previous Patient Records   08/17/18 03/25/12  PSA  Total PSA 0.134 ng/mL 9.02     PROCEDURES:          Urinalysis w/Scope Dipstick Dipstick Cont'd Micro  Color: Yellow Bilirubin: Neg mg/dL WBC/hpf: 0 - 5/hpf  Appearance: Clear Ketones: Neg mg/dL RBC/hpf: NS (Not Seen)  Specific Gravity: 1.025 Blood: Neg ery/uL Bacteria: NS (Not Seen)  pH: 6.5 Protein: 1+ mg/dL Cystals: NS (Not Seen)  Glucose: Neg mg/dL Urobilinogen: 1.0 mg/dL Casts: Hyaline    Nitrites: Neg Trichomonas: Not Present    Leukocyte Esterase: Neg leu/uL Mucous: Present      Epithelial Cells: NS (Not Seen)      Yeast: NS (  Not Seen)      Sperm: Not Present    Notes: Microscopic not concentrated.    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   He has elected to proceed with inflatable penile prosthesis placement.  He understands potential for bleeding, infection, injury to surrounding structures, need for additional procedures.  He understands that if the implant becomes infected, it will have to be removed.  CC: Dr. Radene Ou

## 2019-02-22 NOTE — Discharge Instructions (Addendum)
Discharge instructions following scrotal surgery  Be sure to pull the pump down into the scrotum at least twice daily  Call your doctor for:  Fever is greater than 100.5  Severe nausea or vomiting  Increasing pain not controlled by pain medication  Increasing redness or drainage from incisions  The number for questions or concerns is [phone]  Activity level: No lifting greater than 20 pounds (about equal to milk) for the next 4 weeks or until cleared to do so at follow-up appointment.  Otherwise activity as tolerated by comfort level.  Diet: May resume your regular diet as tolerated  Driving: No driving while still taking opiate pain medications (weight at least 6-8 hours after last dose).  No driving if you still sore from surgery as it may limit her ability to react quickly if necessary.   Shower/bath: May shower and get incision wet pad dry immediately following.  Do not scrub vigorously for the next 2-3 weeks.  Do not soak incision (ID soaking in bath or swimming) until told he may do so by Dr., as this may promote a wound infection.  Wound care: He may cover wounds with sterile gauze as needed to prevent incisions rubbing on close follow-up in any seepage.  Where tight fitting underpants/scrotal support for at least 2 weeks.  He should apply cold compresses (ice or sac of frozen peas/corn) to your scrotum for at least 48 hours to reduce the swelling for 15 minutes at a time indirectly.  You should expect that his scrotum will swell up initially and then get smaller over the next 2-4 weeks.  Follow-up appointments: Follow-up appointment will be scheduled for a wound check.

## 2019-02-22 NOTE — Transfer of Care (Signed)
Immediate Anesthesia Transfer of Care Note  Patient: [name]  Procedure(s) Performed: PENILE PROTHESIS INFLATABLE COLOPLAST (N/A Penis)  Patient Location: PACU  Anesthesia Type:General  Level of Consciousness: awake, sedated and responds to stimulation  Airway & Oxygen Therapy: Patient Spontanous Breathing and Patient connected to face mask oxygen  Post-op Assessment: Report given to RN and Post -op Vital signs reviewed and stable  Post vital signs: Reviewed and stable  Last Vitals:  Vitals Value Taken Time  BP 139/95 02/22/19 1413  Temp 37.2 C 02/22/19 1412  Pulse 72 02/22/19 1415  Resp 10 02/22/19 1415  SpO2 100 % 02/22/19 1415  Vitals shown include unvalidated device data.  Last Pain:  Vitals:   02/22/19 1050  TempSrc: Oral      Patients Stated Pain Goal: 6 ([id number])  Complications: No apparent anesthesia complications

## 2019-02-22 NOTE — Anesthesia Procedure Notes (Signed)
Procedure Name: LMA Insertion Date/Time: 02/22/2019 12:02 PM Performed by: Lollie Sails, CRNA Pre-anesthesia Checklist: Patient identified, Emergency Drugs available, Suction available, Patient being monitored and Timeout performed Patient Re-evaluated:Patient Re-evaluated prior to induction Oxygen Delivery Method: Circle system utilized Preoxygenation: Pre-oxygenation with 100% oxygen Induction Type: IV induction Ventilation: Mask ventilation without difficulty LMA: LMA inserted LMA Size: 5.0 Number of attempts: 1 Placement Confirmation: positive ETCO2 and breath sounds checked- equal and bilateral Tube secured with: Tape Dental Injury: Teeth and Oropharynx as per pre-operative assessment

## 2019-02-22 NOTE — Anesthesia Postprocedure Evaluation (Signed)
Anesthesia Post Note  Patient: [name]  Procedure(s) Performed: PENILE PROTHESIS INFLATABLE COLOPLAST (N/A Penis)     Patient location during evaluation: PACU Anesthesia Type: General Level of consciousness: awake and alert and oriented Pain management: pain level controlled Vital Signs Assessment: post-procedure vital signs reviewed and stable Respiratory status: spontaneous breathing, nonlabored ventilation and respiratory function stable Cardiovascular status: blood pressure returned to baseline and stable Postop Assessment: no apparent nausea or vomiting Anesthetic complications: no    Last Vitals:  Vitals:   02/22/19 1500 02/22/19 1515  BP: (!) 153/88 (!) 151/88  Pulse: 73 79  Resp: (!) 23 20  Temp:    SpO2: 94% 95%    Last Pain:  Vitals:   02/22/19 1515  TempSrc:   PainSc: 5                  Kiandra Sanguinetti A.

## 2019-02-23 DIAGNOSIS — N5231 Erectile dysfunction following radical prostatectomy: Secondary | ICD-10-CM | POA: Diagnosis not present

## 2019-02-23 LAB — BASIC METABOLIC PANEL
Anion gap: 7 (ref 5–15)
BUN: 15 mg/dL (ref 6–20)
CO2: 25 mmol/L (ref 22–32)
Calcium: 8.4 mg/dL — ABNORMAL LOW (ref 8.9–10.3)
Chloride: 106 mmol/L (ref 98–111)
Creatinine, Ser: 1.01 mg/dL (ref 0.61–1.24)
GFR calc Af Amer: >60 mL/min (ref >60)
GFR calc non Af Amer: >60 mL/min (ref >60)
Glucose, Bld: 128 mg/dL — ABNORMAL HIGH (ref 70–99)
Potassium: 4 mmol/L (ref 3.5–5.1)
Sodium: 138 mmol/L (ref 135–145)

## 2019-02-23 LAB — CBC
HCT: 37.1 % — ABNORMAL LOW (ref 39.0–52.0)
Hemoglobin: 11.4 g/dL — ABNORMAL LOW (ref 13.0–17.0)
MCH: 27.2 pg (ref 26.0–34.0)
MCHC: 30.7 g/dL (ref 30.0–36.0)
MCV: 88.5 fL (ref 80.0–100.0)
Platelets: 287 10*3/uL (ref 150–400)
RBC: 4.19 MIL/uL — ABNORMAL LOW (ref 4.22–5.81)
RDW: 13 % (ref 11.5–15.5)
WBC: 7.6 10*3/uL (ref 4.0–10.5)
nRBC: 0 % (ref 0.0–0.2)

## 2019-02-23 LAB — GLUCOSE, CAPILLARY
Glucose-Capillary: 190 mg/dL — ABNORMAL HIGH (ref 70–99)
Glucose-Capillary: 122 mg/dL — ABNORMAL HIGH (ref 70–99)

## 2019-02-23 MED ORDER — GABAPENTIN 300 MG PO CAPS
ORAL_CAPSULE | ORAL | Status: AC
  Filled 2019-02-23: qty 1

## 2019-02-23 MED ORDER — ACETAMINOPHEN 500 MG PO TABS
ORAL_TABLET | ORAL | Status: AC
  Filled 2019-02-23: qty 2

## 2019-02-23 MED ORDER — OXYCODONE HCL 5 MG PO TABS
ORAL_TABLET | ORAL | Status: AC
  Filled 2019-02-23: qty 1

## 2019-02-23 MED ORDER — DOCUSATE SODIUM 100 MG PO CAPS
ORAL_CAPSULE | ORAL | Status: AC
  Filled 2019-02-23: qty 1

## 2019-02-23 MED ORDER — SULFAMETHOXAZOLE-TRIMETHOPRIM 800-160 MG PO TABS: 1.0000 | ORAL_TABLET | Freq: Two times a day (BID) | ORAL | 0 refills | Status: AC

## 2019-02-23 MED ORDER — INSULIN ASPART 100 UNIT/ML ~~LOC~~ SOLN
SUBCUTANEOUS | Status: AC
  Filled 2019-02-23: qty 1

## 2019-02-23 NOTE — Discharge Summary (Signed)
[facility] Discharge Summary  Admit date: 02/22/2019  Discharge date and time: 02/23/19   Discharge to: Home  Discharge Service: Urology  Discharge Attending Physician:  Gloriann Loan  Discharge  Diagnoses: Erectile dysfunction  OR Procedures: Procedure(s): PENILE PROTHESIS INFLATABLE [place] 02/22/2019   Ancillary Procedures: None   Discharge Day Services: The patient was seen and examined by the Urology team both in the morning and immediately prior to discharge.  Vital signs and laboratory values were stable and within normal limits.  The physical exam was benign and unchanged and all surgical wounds were examined.  Discharge instructions were explained and all questions answered.  Subjective  No acute events overnight. Pain Controlled. No fever or chills.  Mummy wrap and drain removed.  No sign of hematoma  Objective Patient Vitals for the past 8 hrs:  BP Temp Pulse Resp SpO2  02/23/19 1035 (!) 158/78 97.7 F (36.5 C) -- 18 100 %  02/23/19 0546 (!) 151/85 98.8 F (37.1 C) 77 16 100 %   Total I/O In: 240 [P.O.:240] Out: 395 [Urine:395]  General Appearance:        No acute distress Lungs:                       Normal work of breathing on room air Heart:                                Regular rate and rhythm Abdomen:                         Soft, non-tender, non-distended GU:         Foley and scrotal drain removed.  No scrotal hematoma.  Device deflated.  Scrotal incision with Dermabond, no erythema or drainage Extremities:                      Warm and well perfused   Hospital Course:  59 year old male with history of prostate cancer status post radical prostatectomy now with erectile dysfunction.  After discussion of risks and benefits he elected for penile prosthesis.  The patient underwent insertion of inflatable penile prosthesis, Coloplast on 02/22/2019.  The patient tolerated the procedure well, was extubated in the OR, and afterwards was taken to the PACU for  routine post-surgical care. When stable the patient was transferred to the floor.     The patient did well postoperatively.  Foley, mummy wrap and JP were removed on the morning of postoperative day 1.  He subsequently passed a trial of void.   The patient was discharged home 1 Day Post-Op, at which point was tolerating a regular solid diet, was able to void spontaneously, have adequate pain control with P.O. pain medication, and could ambulate without difficulty. The patient will follow up with [place] for post op check next Monday which is already scheduled.  He will continue on Bactrim 1 week postoperatively.  Condition at Discharge: Improved  Discharge Medications:  Allergies as of 02/23/2019       Reactions   Cephalexin Hives, Swelling   Belbuca [buprenorphine Hcl]    dizzy        Medication List     STOP taking these medications    Viagra 100 MG tablet Generic drug: sildenafil       TAKE these medications    aspirin EC 81 MG tablet Take 81 mg by  mouth daily.   atorvastatin 80 MG tablet Commonly known as: LIPITOR TAKE 1 TABLET BY MOUTH ONCE DAILY AT  6PM   docusate sodium 100 MG capsule Commonly known as: COLACE Take 100 mg by mouth 2 (two) times daily. Equate brand   gabapentin 300 MG capsule Commonly known as: NEURONTIN TAKE 1 CAPSULE BY MOUTH THREE TIMES DAILY   glipiZIDE 5 MG 24 hr tablet Commonly known as: GLUCOTROL XL Take 5 mg by mouth daily.   GLUCOSAMINE CHOND CMP ADVANCED PO Take by mouth daily.   HYDROcodone-acetaminophen 5-325 MG tablet Commonly known as: Norco Take 1 tablet by mouth every 4 (four) hours as needed for moderate pain.   lisinopril 10 MG tablet Commonly known as: ZESTRIL Take 1 tablet (10 mg total) by mouth daily.   metFORMIN 500 MG tablet Commonly known as: GLUCOPHAGE Take 1 tablet (500 mg total) by mouth 2 (two) times daily with a meal.   metoprolol tartrate 25 MG tablet Commonly known as: LOPRESSOR Take 0.5 tablets (12.5  mg total) by mouth 2 (two) times daily. (BETA BLOCKER)   nitroGLYCERIN 0.4 MG SL tablet Commonly known as: NITROSTAT Place 1 tablet (0.4 mg total) under the tongue every 5 (five) minutes x 3 doses as needed for chest pain.   sulfamethoxazole-trimethoprim 800-160 MG tablet Commonly known as: BACTRIM DS Take 1 tablet by mouth every 12 (twelve) hours for 7 days.   UNABLE TO FIND Vitamin diabetic pack contains: fish oil with vitamin d, multivitamin, vitamin c, alpha lipoic aicd with green tea, chromium

## 2019-04-29 ENCOUNTER — Ambulatory Visit: Payer: Medicare HMO | Attending: Internal Medicine

## 2019-04-29 DIAGNOSIS — Z23 Encounter for immunization: Secondary | ICD-10-CM

## 2019-04-29 NOTE — Progress Notes (Signed)
   Covid-19 Vaccination Clinic  Name:  [name]    MRN: [id number] DOB: 1960-04-27  04/29/2019  [name] was observed post Covid-19 immunization for 15 minutes without incident. He was provided with Vaccine Information Sheet and instruction to access the V-Safe system.   [name] was instructed to call 911 with any severe reactions post vaccine: Marland Kitchen Difficulty breathing  . Swelling of face and throat  . A fast heartbeat  . A bad rash all over body  . Dizziness and weakness   Immunizations Administered    Name Date Dose VIS Date Route   Pfizer COVID-19 Vaccine 04/29/2019 12:12 PM 0.3 mL 01/15/2019 Intramuscular   Manufacturer: [place]   Lot: [id number]   [place]: [id number]

## 2019-05-26 ENCOUNTER — Ambulatory Visit: Payer: Self-pay

## 2019-05-31 NOTE — Progress Notes (Signed)
HPI: Follow-up coronary artery disease. Admitted August 2019 with non-ST elevation myocardial infarction. Cardiac catheterization at that time showed an 85% distal LAD, 80% proximal RCA, 90% mid RCA, occluded circumflex, occluded marginal. Patient had PCI of his circumflex and circumflex marginal.He subsequently had staged PCI of his right coronary artery. Echocardiogram August 2019 showed ejection fraction [id number], grade 2 diastolic dysfunction. Since last seen, he denies dyspnea on exertion, orthopnea, PND, pedal edema, chest pain or syncope. Current Outpatient Medications  Medication Sig Dispense Refill  . aspirin EC 81 MG tablet Take 81 mg by mouth daily.    Marland Kitchen atorvastatin (LIPITOR) 80 MG tablet TAKE 1 TABLET BY MOUTH ONCE DAILY AT  6PM 90 tablet 0  . docusate sodium (COLACE) 100 MG capsule Take 100 mg by mouth 2 (two) times daily. Equate brand    . gabapentin (NEURONTIN) 300 MG capsule TAKE 1 CAPSULE BY MOUTH THREE TIMES DAILY    . glipiZIDE (GLUCOTROL XL) 5 MG 24 hr tablet Take 5 mg by mouth daily.    Marland Kitchen lisinopril (PRINIVIL,ZESTRIL) 10 MG tablet Take 1 tablet (10 mg total) by mouth daily. 30 tablet 4  . metFORMIN (GLUCOPHAGE) 500 MG tablet Take 1 tablet (500 mg total) by mouth 2 (two) times daily with a meal. 180 tablet 3  . metoprolol tartrate (LOPRESSOR) 25 MG tablet Take 0.5 tablets (12.5 mg total) by mouth 2 (two) times daily. (BETA BLOCKER) 60 tablet 0  . Misc Natural Products (GLUCOSAMINE CHOND CMP ADVANCED PO) Take by mouth daily.    . nitroGLYCERIN (NITROSTAT) 0.4 MG SL tablet Place 1 tablet (0.4 mg total) under the tongue every 5 (five) minutes x 3 doses as needed for chest pain. 25 tablet 2   No current facility-administered medications for this visit.     Past Medical History:  Diagnosis Date  . Anxiety   . Arthritis    "neck, shoulders, lower back" (09/15/2017)  . Carpal tunnel syndrome   . Chronic back pain    "neck, lower back" (09/15/2017)  . Depression   .  DVT (deep venous thrombosis) ([place]) ~ 2016   following prostate surgery, treated with course of blood thinners.no blood clot found 2nd [place]  . Erectile dysfunction   . Fibromyalgia   . Former tobacco use   . Heart murmur    "born w/one but it closed"  . Hyperlipidemia   . Hypertension   . Positive TB test yrs ago age 63's  . Prostate cancer ([place]) 2016   a. s/p prostate surgery.  . Sciatica   . Spinal stenosis   . STEMI (ST elevation myocardial infarction) ([place]) 09/13/2017   PCI/DESx1 to the LAD, with staged intervention DESx1 to the RCA, normal EF  . Type II diabetes mellitus ([place])     Past Surgical History:  Procedure Laterality Date  . ANTERIOR CERVICAL DECOMP/DISCECTOMY FUSION  03/2015  . APPENDECTOMY  1995  . BILATERAL CARPAL TUNNEL RELEASE Bilateral 2017   "30 days apart"  . CORONARY ANGIOGRAPHY N/A 09/15/2017   Procedure: CORONARY ANGIOGRAPHY;  Surgeon: Jettie Booze, MD;  Location: [facility];  Service: Cardiovascular;  Laterality: N/A;  . CORONARY ANGIOPLASTY WITH STENT PLACEMENT  09/15/2017  . CORONARY STENT INTERVENTION N/A 09/15/2017   Procedure: CORONARY STENT INTERVENTION;  Surgeon: Jettie Booze, MD;  Location: [facility];  Service: Cardiovascular;  Laterality: N/A;  . CORONARY/GRAFT ACUTE MI REVASCULARIZATION N/A 09/13/2017   Procedure: Coronary/Graft Acute MI Revascularization;  Surgeon: Claiborne Billings,  Joyice Faster, MD;  Location: [facility];  Service: Cardiovascular;  Laterality: N/A;  . LEFT HEART CATH AND CORONARY ANGIOGRAPHY N/A 09/13/2017   Procedure: LEFT HEART CATH AND CORONARY ANGIOGRAPHY;  Surgeon: Troy Sine, MD;  Location: [facility];  Service: Cardiovascular;  Laterality: N/A;  . OTHER SURGICAL HISTORY  2003   "genital warts removed"  . PENILE PROSTHESIS IMPLANT N/A 02/22/2019   Procedure: PENILE PROTHESIS INFLATABLE COLOPLAST;  Surgeon: Lucas Mallow, MD;  Location: [facility];  Service: Urology;   Laterality: N/A;  . PROSTATECTOMY  2016    Social History   Socioeconomic History  . Marital status: Married    Spouse name: Not on file  . Number of children: 8  . Years of education: Not on file  . Highest education level: Not on file  Occupational History  . Occupation: truck Geophysicist/field seismologist  Tobacco Use  . Smoking status: Former Smoker    Packs/day: 0.12    Years: 3.00    Pack years: 0.36    Types: Cigarettes  . Smokeless tobacco: Never Used  . Tobacco comment: 09/15/2017 "nothing in the 200s"  Substance and Sexual Activity  . Alcohol use: Not Currently  . Drug use: Yes    Types: Marijuana    Comment: marijuana used daily  . Sexual activity: Not Currently  Other Topics Concern  . Not on file  Social History Narrative   He lives with wife and daughter.   He is currently not working since October 2014.  He was working as a Administrator, previously was doing long distance, but now only locally.    Social Determinants of Health   Financial Resource Strain:   . Difficulty of Paying Living Expenses:   Food Insecurity:   . Worried About Charity fundraiser in the Last Year:   . Arboriculturist in the Last Year:   Transportation Needs:   . Film/video editor (Medical):   Marland Kitchen Lack of Transportation (Non-Medical):   Physical Activity:   . Days of Exercise per Week:   . Minutes of Exercise per Session:   Stress:   . Feeling of Stress :   Social Connections:   . Frequency of Communication with Friends and Family:   . Frequency of Social Gatherings with Friends and Family:   . Attends Religious Services:   . Active Member of Clubs or Organizations:   . Attends Archivist Meetings:   Marland Kitchen Marital Status:   Intimate Partner Violence:   . Fear of Current or Ex-Partner:   . Emotionally Abused:   Marland Kitchen Physically Abused:   . Sexually Abused:     Family History  Problem Relation Age of Onset  . Diabetes Mother   . Arthritis Mother   . Heart disease Mother        stents   . Diabetes Sister   . Cancer Sister   . Arthritis Maternal Grandmother     ROS: no fevers or chills, productive cough, hemoptysis, dysphasia, odynophagia, melena, hematochezia, dysuria, hematuria, rash, seizure activity, orthopnea, PND, pedal edema, claudication. Remaining systems are negative.  Physical Exam: Well-developed well-nourished in no acute distress.  Skin is warm and dry.  HEENT is normal.  Neck is supple.  Chest is clear to auscultation with normal expansion.  Cardiovascular exam is regular rate and rhythm.  Abdominal exam nontender or distended. No masses palpated. Extremities show no edema. neuro grossly intact  ECG- personally reviewed  A/P  1 coronary artery disease-patient doing well with no chest pain.  Plan to continue medical therapy with aspirin and resume statin.  2 hypertension-patient has run out of his metoprolol.  We will resume his medications.  Follow blood pressure and adjust regimen as needed.  Check potassium and renal function in 12 weeks when he comes for lipids and liver.  3 hyperlipidemia-resume Lipitor 80 mg daily..  Check lipids and liver in 12 weeks.  [name], MD

## 2019-06-03 ENCOUNTER — Encounter: Payer: Self-pay | Admitting: Cardiology

## 2019-06-03 ENCOUNTER — Ambulatory Visit (INDEPENDENT_AMBULATORY_CARE_PROVIDER_SITE_OTHER): Payer: Medicare HMO | Admitting: Cardiology

## 2019-06-03 ENCOUNTER — Other Ambulatory Visit: Payer: Self-pay

## 2019-06-03 VITALS — BP 142/76 | HR 72 | Ht 70.0 in | Wt 262.0 lb

## 2019-06-03 DIAGNOSIS — I10 Essential (primary) hypertension: Secondary | ICD-10-CM

## 2019-06-03 DIAGNOSIS — Z9861 Coronary angioplasty status: Secondary | ICD-10-CM | POA: Diagnosis not present

## 2019-06-03 DIAGNOSIS — I2510 Atherosclerotic heart disease of native coronary artery without angina pectoris: Secondary | ICD-10-CM

## 2019-06-03 DIAGNOSIS — E785 Hyperlipidemia, unspecified: Secondary | ICD-10-CM

## 2019-06-03 MED ORDER — ATORVASTATIN CALCIUM 80 MG PO TABS: ORAL_TABLET | ORAL | 3 refills | Status: DC

## 2019-06-03 MED ORDER — METOPROLOL TARTRATE 25 MG PO TABS: 12.5000 mg | ORAL_TABLET | Freq: Two times a day (BID) | ORAL | 3 refills | Status: DC

## 2019-06-03 MED ORDER — LISINOPRIL 10 MG PO TABS: 10.0000 mg | ORAL_TABLET | Freq: Every day | ORAL | 3 refills | Status: DC

## 2019-06-03 MED ORDER — NITROGLYCERIN 0.4 MG SL SUBL: 0.4000 mg | SUBLINGUAL_TABLET | SUBLINGUAL | 12 refills | Status: DC | PRN

## 2019-06-03 NOTE — Patient Instructions (Signed)
Medication Instructions:  Refill sent to the pharmacy electronically.  *If you need a refill on your cardiac medications before your next appointment, please call your pharmacy*   Lab Work: Your physician recommends that you return for lab work in: [place]  If you have labs (blood work) drawn today and your tests are completely normal, you will receive your results only by: Marland Kitchen MyChart Message (if you have MyChart) OR . A paper copy in the mail If you have any lab test that is abnormal or we need to change your treatment, we will call you to review the results.   Follow-Up: At [facility], you and your health needs are our priority.  As part of our continuing mission to provide you with exceptional heart care, we have created designated Provider Care Teams.  These Care Teams include your primary Cardiologist (physician) and Advanced Practice Providers (APPs -  Physician Assistants and Nurse Practitioners) who all work together to provide you with the care you need, when you need it.  We recommend signing up for the patient portal called "MyChart".  Sign up information is provided on this After Visit Summary.  MyChart is used to connect with patients for Virtual Visits (Telemedicine).  Patients are able to view lab/test results, encounter notes, upcoming appointments, etc.  Non-urgent messages can be sent to your provider as well.   To learn more about what you can do with MyChart, go to [email or web address].    Your next appointment:   12 month(s)  The format for your next appointment:   Either In Person or Virtual  Provider:   You may see [name], MD or one of the following Advanced Practice Providers on your designated Care Team:    Kerin Ransom, PA-C  [place], [place]  Coletta Memos, [place]

## 2019-06-30 ENCOUNTER — Other Ambulatory Visit: Payer: Self-pay | Admitting: Urology

## 2019-07-01 ENCOUNTER — Encounter (HOSPITAL_BASED_OUTPATIENT_CLINIC_OR_DEPARTMENT_OTHER): Payer: Self-pay | Admitting: Urology

## 2019-07-01 ENCOUNTER — Other Ambulatory Visit: Payer: Self-pay

## 2019-07-01 NOTE — Progress Notes (Addendum)
Spoke with Konrad Felix pa meets [facility] guidelines ok to proceed.  Spoke w/ via phone for pre-op interview---patient Lab needs dos----  I stat 8           COVID test ------07-03-2019@950  am Arrive at -------930 am 07-07-2019 NPO after ------midnight Medications to take morning of surgery -----none Diabetic medication -----none day of surgery Patient Special Instructions -----none Pre-Op special Istructions -----none Patient verbalized understanding of instructions that were given at this phone interview. Patient denies shortness of breath, chest pain, fever, cough a this phone interview.  Anesthesia : hx of mi, dm, worked up by anesthesia 02-19-2019 for 02-22-2019 sx at [place] Chart to Janett Billow zanetto pa for review  PCP: dr [name] Cardiologist :dr Kathyrn Drown 06-03-2019 epic Chest x-ray : 10-22-2018 epic EKG : 02-22-2019 epic Echo : 09-14-2017 epic Cardiac Cath :  09-15-2017 epic Sleep Study/ CPAP :n/a Fasting Blood Sugar : 137-199     / Checks Blood Sugar  2 times a day:   Blood Thinner/ Instructions /Last Dose:n/a ASA / Instructions/ Last Dose : stopping 81 mg aspirin per dr bell instructions on 07-03-2019  Patient denies shortness of breath, chest pain, fever, and cough at this phone interview.  Patient has 2 rings stuck on left pinkie finger will try to remove

## 2019-07-03 ENCOUNTER — Other Ambulatory Visit (HOSPITAL_COMMUNITY)
Admission: RE | Admit: 2019-07-03 | Discharge: 2019-07-03 | Disposition: A | Discharge status: Home / Self Care | Payer: Medicare HMO | Source: Ambulatory Visit | Attending: Urology | Admitting: Urology

## 2019-07-03 DIAGNOSIS — Z01812 Encounter for preprocedural laboratory examination: Secondary | ICD-10-CM | POA: Insufficient documentation

## 2019-07-03 DIAGNOSIS — Z20822 Contact with and (suspected) exposure to covid-19: Secondary | ICD-10-CM | POA: Diagnosis not present

## 2019-07-03 LAB — SARS CORONAVIRUS 2 (TAT 6-24 HRS): SARS Coronavirus 2: NEGATIVE

## 2019-07-07 ENCOUNTER — Encounter (HOSPITAL_BASED_OUTPATIENT_CLINIC_OR_DEPARTMENT_OTHER)
Admission: RE | Disposition: A | Discharge status: Home / Self Care | Payer: Self-pay | Source: Home / Self Care | Attending: Urology

## 2019-07-07 ENCOUNTER — Ambulatory Visit (HOSPITAL_BASED_OUTPATIENT_CLINIC_OR_DEPARTMENT_OTHER)
Admission: RE | Admit: 2019-07-07 | Discharge: 2019-07-07 | Disposition: A | Discharge status: Home / Self Care | Payer: Medicare HMO | Attending: Urology | Admitting: Urology

## 2019-07-07 ENCOUNTER — Ambulatory Visit (HOSPITAL_BASED_OUTPATIENT_CLINIC_OR_DEPARTMENT_OTHER): Payer: Medicare HMO | Admitting: Physician Assistant

## 2019-07-07 ENCOUNTER — Encounter (HOSPITAL_BASED_OUTPATIENT_CLINIC_OR_DEPARTMENT_OTHER): Payer: Self-pay | Admitting: Urology

## 2019-07-07 DIAGNOSIS — Z87891 Personal history of nicotine dependence: Secondary | ICD-10-CM | POA: Diagnosis not present

## 2019-07-07 DIAGNOSIS — Y842 Radiological procedure and radiotherapy as the cause of abnormal reaction of the patient, or of later complication, without mention of misadventure at the time of the procedure: Secondary | ICD-10-CM | POA: Diagnosis not present

## 2019-07-07 DIAGNOSIS — E119 Type 2 diabetes mellitus without complications: Secondary | ICD-10-CM | POA: Insufficient documentation

## 2019-07-07 DIAGNOSIS — Z8546 Personal history of malignant neoplasm of prostate: Secondary | ICD-10-CM | POA: Insufficient documentation

## 2019-07-07 DIAGNOSIS — Z7984 Long term (current) use of oral hypoglycemic drugs: Secondary | ICD-10-CM | POA: Diagnosis not present

## 2019-07-07 DIAGNOSIS — Z7902 Long term (current) use of antithrombotics/antiplatelets: Secondary | ICD-10-CM | POA: Diagnosis not present

## 2019-07-07 DIAGNOSIS — N320 Bladder-neck obstruction: Secondary | ICD-10-CM | POA: Diagnosis not present

## 2019-07-07 DIAGNOSIS — Z955 Presence of coronary angioplasty implant and graft: Secondary | ICD-10-CM | POA: Insufficient documentation

## 2019-07-07 DIAGNOSIS — I10 Essential (primary) hypertension: Secondary | ICD-10-CM | POA: Insufficient documentation

## 2019-07-07 DIAGNOSIS — F418 Other specified anxiety disorders: Secondary | ICD-10-CM | POA: Diagnosis not present

## 2019-07-07 DIAGNOSIS — M1990 Unspecified osteoarthritis, unspecified site: Secondary | ICD-10-CM | POA: Insufficient documentation

## 2019-07-07 DIAGNOSIS — Z7982 Long term (current) use of aspirin: Secondary | ICD-10-CM | POA: Insufficient documentation

## 2019-07-07 DIAGNOSIS — I2510 Atherosclerotic heart disease of native coronary artery without angina pectoris: Secondary | ICD-10-CM | POA: Diagnosis not present

## 2019-07-07 DIAGNOSIS — Z79899 Other long term (current) drug therapy: Secondary | ICD-10-CM | POA: Insufficient documentation

## 2019-07-07 DIAGNOSIS — I252 Old myocardial infarction: Secondary | ICD-10-CM | POA: Insufficient documentation

## 2019-07-07 DIAGNOSIS — N3041 Irradiation cystitis with hematuria: Secondary | ICD-10-CM | POA: Diagnosis present

## 2019-07-07 HISTORY — DX: Hematuria, unspecified: R31.9

## 2019-07-07 HISTORY — PX: CYSTOSCOPY WITH FULGERATION: SHX6638

## 2019-07-07 LAB — GLUCOSE, CAPILLARY: Glucose-Capillary: 139 mg/dL — ABNORMAL HIGH (ref 70–99)

## 2019-07-07 LAB — POCT I-STAT, CHEM 8
BUN: 13 mg/dL (ref 6–20)
Calcium, Ion: 1.23 mmol/L (ref 1.15–1.40)
Chloride: 105 mmol/L (ref 98–111)
Creatinine, Ser: 1 mg/dL (ref 0.61–1.24)
Glucose, Bld: 117 mg/dL — ABNORMAL HIGH (ref 70–99)
HCT: 32 % — ABNORMAL LOW (ref 39.0–52.0)
Hemoglobin: 10.9 g/dL — ABNORMAL LOW (ref 13.0–17.0)
Potassium: 3.8 mmol/L (ref 3.5–5.1)
Sodium: 142 mmol/L (ref 135–145)
TCO2: 26 mmol/L (ref 22–32)

## 2019-07-07 SURGERY — CYSTOSCOPY, WITH BLADDER FULGURATION
Anesthesia: General | Site: Bladder

## 2019-07-07 MED ORDER — CIPROFLOXACIN IN D5W 400 MG/200ML IV SOLN
INTRAVENOUS | Status: AC
  Filled 2019-07-07: qty 200

## 2019-07-07 MED ORDER — FENTANYL CITRATE (PF) 100 MCG/2ML IJ SOLN
INTRAMUSCULAR | Status: AC
  Filled 2019-07-07: qty 2

## 2019-07-07 MED ORDER — MIDAZOLAM HCL 2 MG/2ML IJ SOLN
INTRAMUSCULAR | Status: AC
  Filled 2019-07-07: qty 2

## 2019-07-07 MED ORDER — FENTANYL CITRATE (PF) 100 MCG/2ML IJ SOLN
INTRAMUSCULAR | Status: DC | PRN
  Administered 2019-07-07: 100 ug via INTRAVENOUS

## 2019-07-07 MED ORDER — DEXAMETHASONE SODIUM PHOSPHATE 10 MG/ML IJ SOLN
INTRAMUSCULAR | Status: AC
  Filled 2019-07-07: qty 1

## 2019-07-07 MED ORDER — MIDAZOLAM HCL 5 MG/5ML IJ SOLN
INTRAMUSCULAR | Status: DC | PRN
  Administered 2019-07-07: 2 mg via INTRAVENOUS

## 2019-07-07 MED ORDER — BELLADONNA ALKALOIDS-OPIUM 16.2-30 MG RE SUPP
RECTAL | Status: AC
  Filled 2019-07-07: qty 1

## 2019-07-07 MED ORDER — DEXAMETHASONE SODIUM PHOSPHATE 10 MG/ML IJ SOLN
INTRAMUSCULAR | Status: DC | PRN
  Administered 2019-07-07: 10 mg via INTRAVENOUS

## 2019-07-07 MED ORDER — STERILE WATER FOR IRRIGATION IR SOLN
Status: DC | PRN
  Administered 2019-07-07: 6000 mL

## 2019-07-07 MED ORDER — MEPERIDINE HCL 25 MG/ML IJ SOLN: 6.2500 mg | INTRAMUSCULAR | Status: DC | PRN

## 2019-07-07 MED ORDER — CIPROFLOXACIN IN D5W 400 MG/200ML IV SOLN
INTRAVENOUS | Status: DC | PRN
Start: 2019-07-07 — End: 2019-07-07
  Administered 2019-07-07: 400 mg via INTRAVENOUS

## 2019-07-07 MED ORDER — HYDROMORPHONE HCL 1 MG/ML IJ SOLN
INTRAMUSCULAR | Status: AC
  Filled 2019-07-07: qty 1

## 2019-07-07 MED ORDER — LACTATED RINGERS IV SOLN: INTRAVENOUS | Status: DC

## 2019-07-07 MED ORDER — HYDROMORPHONE HCL 1 MG/ML IJ SOLN
0.2500 mg | INTRAMUSCULAR | Status: DC | PRN
  Administered 2019-07-07: 0.5 mg via INTRAVENOUS
  Administered 2019-07-07 (×2): 0.25 mg via INTRAVENOUS

## 2019-07-07 MED ORDER — ONDANSETRON HCL 4 MG/2ML IJ SOLN
INTRAMUSCULAR | Status: AC
  Filled 2019-07-07: qty 2

## 2019-07-07 MED ORDER — HYDROCODONE-ACETAMINOPHEN 5-325 MG PO TABS: 1.0000 | ORAL_TABLET | ORAL | 0 refills | Status: DC | PRN

## 2019-07-07 MED ORDER — PROPOFOL 10 MG/ML IV BOLUS
INTRAVENOUS | Status: AC
  Filled 2019-07-07: qty 20

## 2019-07-07 MED ORDER — LIDOCAINE 2% (20 MG/ML) 5 ML SYRINGE
INTRAMUSCULAR | Status: AC
  Filled 2019-07-07: qty 5

## 2019-07-07 MED ORDER — LIDOCAINE 2% (20 MG/ML) 5 ML SYRINGE
INTRAMUSCULAR | Status: DC | PRN
  Administered 2019-07-07: 100 mg via INTRAVENOUS

## 2019-07-07 MED ORDER — SULFAMETHOXAZOLE-TRIMETHOPRIM 800-160 MG PO TABS
1.0000 | ORAL_TABLET | Freq: Two times a day (BID) | ORAL | 0 refills | Status: DC
Start: 2019-07-07 — End: 2020-11-07

## 2019-07-07 MED ORDER — ONDANSETRON HCL 4 MG/2ML IJ SOLN: 4.0000 mg | Freq: Once | INTRAMUSCULAR | Status: DC | PRN

## 2019-07-07 MED ORDER — ONDANSETRON HCL 4 MG/2ML IJ SOLN
INTRAMUSCULAR | Status: DC | PRN
  Administered 2019-07-07: 4 mg via INTRAVENOUS

## 2019-07-07 MED ORDER — BELLADONNA ALKALOIDS-OPIUM 16.2-30 MG RE SUPP
1.0000 | Freq: Once | RECTAL | Status: AC
  Administered 2019-07-07: 1 via RECTAL

## 2019-07-07 MED ORDER — PROPOFOL 10 MG/ML IV BOLUS
INTRAVENOUS | Status: DC | PRN
  Administered 2019-07-07: 180 mg via INTRAVENOUS

## 2019-07-07 SURGICAL SUPPLY — 28 items
BAG DRAIN URO-CYSTO SKYTR STRL (DRAIN) ×3 IMPLANT
BAG URINE DRAIN 2000ML AR STRL (UROLOGICAL SUPPLIES) ×2 IMPLANT
CATH FOLEY 2WAY SLVR  5CC 20FR (CATHETERS) ×2
CATH FOLEY 2WAY SLVR 5CC 20FR (CATHETERS) IMPLANT
CLOTH BEACON ORANGE TIMEOUT ST (SAFETY) ×3 IMPLANT
ELECT REM PT RETURN 9FT ADLT (ELECTROSURGICAL) ×3
ELECTRODE REM PT RTRN 9FT ADLT (ELECTROSURGICAL) ×1 IMPLANT
GLOVE BIO SURGEON STRL SZ8 (GLOVE) ×3 IMPLANT
GOWN STRL REUS W/ TWL LRG LVL3 (GOWN DISPOSABLE) ×1 IMPLANT
GOWN STRL REUS W/ TWL XL LVL3 (GOWN DISPOSABLE) ×1 IMPLANT
GOWN STRL REUS W/TWL LRG LVL3 (GOWN DISPOSABLE) ×2
GOWN STRL REUS W/TWL XL LVL3 (GOWN DISPOSABLE) ×2
GUIDEWIRE STR DUAL SENSOR (WIRE) ×2 IMPLANT
IV NS IRRIG 3000ML ARTHROMATIC (IV SOLUTION) ×2 IMPLANT
KIT TURNOVER CYSTO (KITS) ×3 IMPLANT
LOOP CUT BIPOLAR 24F LRG (ELECTROSURGICAL) ×2 IMPLANT
MANIFOLD NEPTUNE II (INSTRUMENTS) ×2 IMPLANT
NDL SAFETY ECLIPSE 18X1.5 (NEEDLE) IMPLANT
NEEDLE HYPO 18GX1.5 SHARP (NEEDLE)
NEEDLE HYPO 22GX1.5 SAFETY (NEEDLE) IMPLANT
NS IRRIG 500ML POUR BTL (IV SOLUTION) ×2 IMPLANT
SYR 20ML LL LF (SYRINGE) ×2 IMPLANT
SYR TOOMEY IRRIG 70ML (MISCELLANEOUS) ×3
SYRINGE TOOMEY IRRIG 70ML (MISCELLANEOUS) IMPLANT
TRAY CYSTO PACK (CUSTOM PROCEDURE TRAY) ×3 IMPLANT
TUBE CONNECTING 12'X1/4 (SUCTIONS) ×1
TUBE CONNECTING 12X1/4 (SUCTIONS) ×1 IMPLANT
WATER STERILE IRR 3000ML UROMA (IV SOLUTION) ×5 IMPLANT

## 2019-07-07 NOTE — Discharge Instructions (Addendum)
Nurses will teach you how to use your Foley catheter.  This will be removed in about 1 week.  You will be contacted with a follow-up appointment.  You may notice some blood in the urine.  As long as your catheter drains well, this is okay.  If your catheter stops draining, please contact us immediately or go to the emergency department.  Complete your antibiotics as prescribed  Indwelling Urinary Catheter Care, Adult An indwelling urinary catheter is a thin tube that is put into your bladder. The tube helps to drain pee (urine) out of your body. The tube goes in through your urethra. Your urethra is where pee comes out of your body. Your pee will come out through the catheter, then it will go into a bag (drainage bag). Take good care of your catheter so it will work well. How to wear your catheter and bag Supplies needed  Sticky tape (adhesive tape) or a leg strap.  Alcohol wipe or soap and water (if you use tape).  A clean towel (if you use tape).  Large overnight bag.  Smaller bag (leg bag). Wearing your catheter Attach your catheter to your leg with tape or a leg strap.  Make sure the catheter is not pulled tight.  If a leg strap gets wet, take it off and put on a dry strap.  If you use tape to hold the bag on your leg: 1. Use an alcohol wipe or soap and water to wash your skin where the tape made it sticky before. 2. Use a clean towel to pat-dry that skin. 3. Use new tape to make the bag stay on your leg. Wearing your bags You should have been given a large overnight bag.  You may wear the overnight bag in the day or night.  Always have the overnight bag lower than your bladder.  Do not let the bag touch the floor.  Before you go to sleep, put a clean plastic bag in a wastebasket. Then hang the overnight bag inside the wastebasket. You should also have a smaller leg bag that fits under your clothes.  Always wear the leg bag below your knee.  Do not wear your leg bag at  night. How to care for your skin and catheter Supplies needed  A clean washcloth.  Water and mild soap.  A clean towel. Caring for your skin and catheter      Clean the skin around your catheter every day: 1. Wash your hands with soap and water. 2. Wet a clean washcloth in warm water and mild soap. 3. Clean the skin around your urethra.  If you are male:  Gently spread the folds of skin around your vagina (labia).  With the washcloth in your other hand, wipe the inner side of your labia on each side. Wipe from front to back.  If you are male:  Pull back any skin that covers the end of your penis (foreskin).  With the washcloth in your other hand, wipe your penis in small circles. Start wiping at the tip of your penis, then move away from the catheter.  Move the foreskin back in place, if needed. 4. With your free hand, hold the catheter close to where it goes into your body.  Keep holding the catheter during cleaning so it does not get pulled out. 5. With the washcloth in your other hand, clean the catheter.  Only wipe downward on the catheter.  Do not wipe upward toward your body.  Doing this may push germs into your urethra and cause infection. 6. Use a clean towel to pat-dry the catheter and the skin around it. Make sure to wipe off all soap. 7. Wash your hands with soap and water.  Shower every day. Do not take baths.  Do not use cream, ointment, or lotion on the area where the catheter goes into your body, unless your doctor tells you to.  Do not use powders, sprays, or lotions on your genital area.  Check your skin around the catheter every day for signs of infection. Check for: ? Redness, swelling, or pain. ? Fluid or blood. ? Warmth. ? Pus or a bad smell. How to empty the bag Supplies needed  Rubbing alcohol.  Gauze pad or cotton ball.  Tape or a leg strap. Emptying the bag Pour the pee out of your bag when it is ?- full, or at least 2-3 times  a day. Do this for your overnight bag and your leg bag. 1. Wash your hands with soap and water. 2. Separate (detach) the bag from your leg. 3. Hold the bag over the toilet or a clean pail. Keep the bag lower than your hips and bladder. This is so the pee (urine) does not go back into the tube. 4. Open the pour spout. It is at the bottom of the bag. 5. Empty the pee into the toilet or pail. Do not let the pour spout touch any surface. 6. Put rubbing alcohol on a gauze pad or cotton ball. 7. Use the gauze pad or cotton ball to clean the pour spout. 8. Close the pour spout. 9. Attach the bag to your leg with tape or a leg strap. 10. Wash your hands with soap and water. Follow instructions for cleaning the drainage bag:  From the product maker.  As told by your doctor. How to change the bag Supplies needed  Alcohol wipes.  A clean bag.  Tape or a leg strap. Changing the bag Replace your bag when it starts to leak, smell bad, or look dirty. 1. Wash your hands with soap and water. 2. Separate the dirty bag from your leg. 3. Pinch the catheter with your fingers so that pee does not spill out. 4. Separate the catheter tube from the bag tube where these tubes connect (at the connection valve). Do not let the tubes touch any surface. 5. Clean the end of the catheter tube with an alcohol wipe. Use a different alcohol wipe to clean the end of the bag tube. 6. Connect the catheter tube to the tube of the clean bag. 7. Attach the clean bag to your leg with tape or a leg strap. Do not make the bag tight on your leg. 8. Wash your hands with soap and water. General rules   Never pull on your catheter. Never try to take it out. Doing that can hurt you.  Always wash your hands before and after you touch your catheter or bag. Use a mild, fragrance-free soap. If you do not have soap and water, use hand sanitizer.  Always make sure there are no twists or bends (kinks) in the catheter  tube.  Always make sure there are no leaks in the catheter or bag.  Drink enough fluid to keep your pee pale yellow.  Do not take baths, swim, or use a hot tub.  If you are male, wipe from front to back after you poop (have a bowel movement). Contact a doctor if:  Your pee is cloudy.  Your pee smells worse than usual.  Your catheter gets clogged.  Your catheter leaks.  Your bladder feels full. Get help right away if:  You have redness, swelling, or pain where the catheter goes into your body.  You have fluid, blood, pus, or a bad smell coming from the area where the catheter goes into your body.  Your skin feels warm where the catheter goes into your body.  You have a fever.  You have pain in your: ? Belly (abdomen). ? Legs. ? Lower back. ? Bladder.  You see blood in the catheter.  Your pee is pink or red.  You feel sick to your stomach (nauseous).  You throw up (vomit).  You have chills.  Your pee is not draining into the bag.  Your catheter gets pulled out. Summary  An indwelling urinary catheter is a thin tube that is placed into the bladder to help drain pee (urine) out of the body.  The catheter is placed into the part of the body that drains pee from the bladder (urethra).  Taking good care of your catheter will keep it working properly and help prevent problems.  Always wash your hands before and after touching your catheter or bag.  Never pull on your catheter or try to take it out. This information is not intended to replace advice given to you by your health care provider. Make sure you discuss any questions you have with your health care provider. Document Revised: 05/15/2018 Document Reviewed: 09/06/2016 Elsevier Patient Education  [place] Instructions  Activity: Get plenty of rest for the remainder of the day. A responsible individual must stay with you for 24 hours following the procedure.   For the next 24 hours, DO NOT: -Drive a car -Paediatric nurse -Drink alcoholic beverages -Take any medication unless instructed by your physician -Make any legal decisions or sign important papers.  Meals: Start with liquid foods such as gelatin or soup. Progress to regular foods as tolerated. Avoid greasy, spicy, heavy foods. If nausea and/or vomiting occur, drink only clear liquids until the nausea and/or vomiting subsides. Call your physician if vomiting continues.  Special Instructions/Symptoms: Your throat may feel dry or sore from the anesthesia or the breathing tube placed in your throat during surgery. If this causes discomfort, gargle with warm salt water. The discomfort should disappear within 24 hours.

## 2019-07-07 NOTE — Anesthesia Preprocedure Evaluation (Signed)
Anesthesia Evaluation  Patient identified by MRN, date of birth, ID band Patient awake    Reviewed: Allergy & Precautions, NPO status , Patient's Chart, lab work & pertinent test results  Airway Mallampati: II  TM Distance: >3 FB Neck ROM: Full    Dental   Pulmonary former smoker,    Pulmonary exam normal        Cardiovascular hypertension, Pt. on medications + CAD, + Past MI and + Cardiac Stents  Normal cardiovascular exam     Neuro/Psych Anxiety Depression    GI/Hepatic   Endo/Other  diabetes  Renal/GU      Musculoskeletal   Abdominal   Peds  Hematology   Anesthesia Other Findings   Reproductive/Obstetrics                             Anesthesia Physical Anesthesia Plan  ASA: III  Anesthesia Plan: General   Post-op Pain Management:    Induction: Intravenous  PONV Risk Score and Plan: 2 and Ondansetron and Midazolam  Airway Management Planned: LMA  Additional Equipment:   Intra-op Plan:   Post-operative Plan: Extubation in OR  Informed Consent: I have reviewed the patients History and Physical, chart, labs and discussed the procedure including the risks, benefits and alternatives for the proposed anesthesia with the patient or authorized representative who has indicated his/her understanding and acceptance.       Plan Discussed with: CRNA and Surgeon  Anesthesia Plan Comments:         Anesthesia Quick Evaluation

## 2019-07-07 NOTE — Interval H&P Note (Signed)
History and Physical Interval Note:  07/07/2019 10:50 AM  [name]  has presented today for surgery, with the diagnosis of HEMATURIA.  The various methods of treatment have been discussed with the patient and family. After consideration of risks, benefits and other options for treatment, the patient has consented to  Procedure(s): [place] (N/A) as a surgical intervention.  The patient's history has been reviewed, patient examined, no change in status, stable for surgery.  I have reviewed the patient's chart and labs.  Questions were answered to the patient's satisfaction.     Marton Redwood, III

## 2019-07-07 NOTE — Progress Notes (Signed)
Please excuse Andrw Sato from work on 07/07/2019 and 07/08/2019 while she cares for her husband after he had surgery.  If you have any questions, please do not hesitate to contact me.

## 2019-07-07 NOTE — Transfer of Care (Signed)
Immediate Anesthesia Transfer of Care Note  Patient: [name]  Procedure(s) Performed: [place] (N/A Bladder)  Patient Location: PACU  Anesthesia Type:General  Level of Consciousness: awake, alert  and oriented  Airway & Oxygen Therapy: Patient Spontanous Breathing and Patient connected to nasal cannula oxygen  Post-op Assessment: Report given to RN and Post -op Vital signs reviewed and stable  Post vital signs: Reviewed and stable  Last Vitals:  Vitals Value Taken Time  BP 170/110 07/07/19 1148  Temp    Pulse 79 07/07/19 1150  Resp 13 07/07/19 1150  SpO2 100 % 07/07/19 1150  Vitals shown include unvalidated device data.  Last Pain:  Vitals:   07/07/19 0951  TempSrc: Oral  PainSc: 4       Patients Stated Pain Goal: 4 ([id number])  Complications: No apparent anesthesia complications

## 2019-07-07 NOTE — Anesthesia Procedure Notes (Signed)
Procedure Name: LMA Insertion Date/Time: 07/07/2019 10:59 AM Performed by: Symphony Demuro D, CRNA Pre-anesthesia Checklist: Patient identified, Emergency Drugs available, Suction available and Patient being monitored Patient Re-evaluated:Patient Re-evaluated prior to induction Oxygen Delivery Method: Circle system utilized Preoxygenation: Pre-oxygenation with 100% oxygen Induction Type: IV induction Ventilation: Mask ventilation without difficulty LMA: LMA inserted LMA Size: 5.0 Tube type: Oral Number of attempts: 1 Placement Confirmation: positive ETCO2 and breath sounds checked- equal and bilateral Tube secured with: Tape Dental Injury: Teeth and Oropharynx as per pre-operative assessment

## 2019-07-07 NOTE — Op Note (Signed)
Operative Note  Preoperative diagnosis:  1.  Hematuria secondary to radiation cystitis  Postoperative diagnosis: 1.  Hematuria secondary to radiation cystitis 2.  Mild bladder neck contracture  Procedure(s): 1.  Cystoscopy with urethral dilation 2.  Bladder fulguration  Surgeon: Link Snuffer, MD  Assistants: None  Anesthesia: General  Complications: None immediate  EBL: Minimal  Specimens: 1.  None  Drains/Catheters: 1.  20 French Foley catheter  Intraoperative findings: 1.  Normal anterior urethra 2.  Approximately 39 French bladder neck contracture unable to pass the resectoscope.  Therefore it was dilated to 40 [place] and then the scope easily passed. 3.  Bilateral ureteral orifices were normal.  Effluxed clear urine.  There was a significant amount of hypervascularity especially around the bladder neck.  There was also some hypervascularity along the posterior bladder wall, dome, lateral walls.  These areas were fulgurated.  Bilateral ureteral orifices were away from the area of resection.  There were no tumors or bladder stones.  Indication: 59 year old male status post prostatectomy followed by salvage radiation.  He has been suffering from recurrent radiation cystitis despite medical management including hyperbaric oxygen.  He presents for cystoscopy under anesthesia with possible biopsy with fulguration.  Description of procedure:  The patient was identified and consent was obtained.  The patient was taken to the operating room and placed in the supine position.  The patient was placed under general anesthesia.  Perioperative antibiotics were administered.  The patient was placed in dorsal lithotomy.  Patient was prepped and draped in a standard sterile fashion and a timeout was performed.  A 26 French resectoscope with the visual obturator in place was advanced into the urethra and up to the bladder neck.  A mild bladder neck contracture was encountered.  Therefore, I  passed a wire through this and withdrew the scope.  I dilated the urethra over a wire sequentially from 16 [place] up to 28 [place].  I withdrew the wire and reinspected the urethra and was able to easily pass the resectoscope into the bladder.  I exchanged for the working element.  I inspected the entire bladder mucosa with the findings noted above.  On bipolar settings, I fulgurated the areas of hypervascularity.  There were no areas of significant active bleeding after this.  I therefore withdrew the scope visualizing the urethra upon removal.  I decided to leave a 20 [place] Foley catheter given the fact that he had to have urethral dilation.  Patient tolerated procedure well and was stable postoperatively.  Plan: Follow-up in 1 week for voiding trial

## 2019-07-07 NOTE — H&P (Signed)
CC/HPI: CC: Post-prostatectomy erectile dysfunction  HPI:  12/04/2017  59 year old male status post robotic-assisted laparoscopic prostatectomy by Dr. Don Broach who was at [facility] at the time. Pathology revealed Gleason 3+4 adenocarcinoma of the prostate PT 2C with positive margins. For this reason, he underwent adjuvant radiation which was completed in 2016. Last PSA was 0.01 in May 2019. Patient is a diabetic and reports that his last hemoglobin A1c was 6.4. The patient has failed injections as well as oral PDE 5 inhibitors. He did not have good response with these. He does have mild stress urinary incontinence with cough, laugh, sneeze but does not wear any pads. He does have a history of coronary artery disease. Last myocardial infarction was in August and he had cardiac stents placed. He is currently on aspirin 81 and brilinta. His cardiologist is Dr. [name]. The patient is interested in a penile prosthetic. He has done a great deal of research on this. He was originally scheduled to have this done by Dr. Estill Dooms but has decided to come here for evaluation.   08/13/2018  In the interval, the patient had an episode of gross hematuria. CT IVP revealed a Bosniak 1. Renal cyst, but otherwise was negative. He presents today for cystoscopy. He has not had an updated PSA. He will get one today. No further gross hematuria.   09/23/2018  Since Sunday, the patient has had intermittent gross hematuria. He has passed a few clots. He is voiding fine now. He however continues with gross hematuria. Cystoscopy 6 weeks ago was negative. So was the CT IVP. Most recent PSA was 0.134 On 08/17/2018. He denies any dysuria.   12/22/2018  Patient had blood work drawn last week. PSA results are not back yet. He started hyperbaric oxygen and is continuing to undergo this. He is doing well with this. He has not had any hematuria for the past 2 weeks. He also came off his blood thinner which was cleared by his cardiologist. He is  still debating whether not to get a penile prosthesis but he and his wife are not really sexually active at this time and he wants to talk to his wife some more about this. Urinalysis is negative today. No voiding complaints. He does wear 2 pads per day which is slightly bothersome. Not enough to consider procedural intervention.   04/01/2019  Patient is status post inflatable penile prosthesis placement. He has some mild tenderness in the scrotum but otherwise is doing well. No voiding complaints. No further gross hematuria.   06/24/2019  Most recent PSA was further decreased and is 0. 026. Patient has been having gross hematuria with clots.     CC: AUA Questions Scoring.  HPI: [name] is a 59 year-old male established patient who is here AUA Questions.      AUA Symptom Score: He never has the sensation of not emptying his bladder completely after finishing urinating. More than 50% of the time he has to urinate again fewer than two hours after he has finished urinating. Less than 50% of the time he has to start and stop again several times when he urinates. Less than 20% of the time he finds it difficult to postpone urination. He never has a weak urinary stream. Almost always he has to push or strain to begin urination. He has to get up to urinate 4 times from the time he goes to bed until the time he gets up in the morning.   Calculated AUA Symptom Score: 16  ALLERGIES: None   MEDICATIONS: Lisinopril 10 mg tablet  Aspirin Ec 81 mg tablet, delayed release  Atorvastatin Calcium 80 mg tablet  Buprenorphine Hcl  Buspirone Hcl 10 mg tablet  Citalopram Hbr 40 mg tablet  Colace  Gabapentin 300 mg capsule  Glipizide 5 mg tablet  Glucophage TABS Oral  Hydrocodone-Acetaminophen 5 mg-325 mg tablet 1 tablet PO Q 6 H PRN  Mupirocin 2 % ointment  Nitroglycerin 0.4 mg tablet, sublingual  Polyethylene Glycol  Sildenafil Citrate 100 mg tablet  Sildenafil Citrate 100 mg tablet 1 tablet  PO prn Take 1 tab po 1hr prior to sexual activities     GU PSH: Cystoscopy - 08/13/2018 Insertion IPP - 02/22/2019 Locm 300-399Mg /Ml Iodine,1Ml - 07/15/2018 Remove Prostate.       PSH Notes: Appendectomy   NON-GU PSH: Appendectomy - 2014 Appendectomy (laparoscopic) Carpal tunnel surgery Neck Surgery     GU PMH: Radiation cystitis (with hematuria) - 12/22/2018 Prostate Cancer - 09/23/2018 Gross hematuria - 07/14/2018 ED following radical prostatectomy - 12/04/2017 History of prostate cancer - 12/04/2017 Stress Incontinence - 12/04/2017 Elevated PSA, Elevated prostate specific antigen (PSA) - 2014      PMH Notes:  1898-02-04 00:00:00 - Note: Normal Routine History And Physical Adult  2012-03-24 15:30:51 - Note: Arthritis   NON-GU PMH: Cardiac murmur, unspecified, Murmurs - 2014 Personal history of other diseases of the circulatory system, History of hypertension - 2014 Personal history of other endocrine, nutritional and metabolic disease, History of diabetes mellitus - 2014 Anxiety Arthritis Cataract    FAMILY HISTORY: Breast Cancer - Sister Death In The Family Father - Runs In Family Diabetes - Runs In Family Family Health Status Number - Runs In Family   SOCIAL HISTORY: Marital Status: Married Preferred Language: English; Race: Black or African American Current Smoking Status: Patient does not smoke anymore.   Tobacco Use Assessment Completed: Used Tobacco in last 30 days? Drinks 2 caffeinated drinks per day.     Notes: 3 sons, 3 daughters    REVIEW OF SYSTEMS:    GU Review Male:   Patient reports frequent urination and have to strain to urinate . Patient denies hard to postpone urination, burning/ pain with urination, get up at night to urinate, leakage of urine, stream starts and stops, trouble starting your stream, erection problems, and penile pain.  Gastrointestinal (Upper):   Patient denies nausea, vomiting, and indigestion/ heartburn.  Gastrointestinal  (Lower):   Patient denies diarrhea and constipation.  Constitutional:   Patient denies fever, night sweats, weight loss, and fatigue.  Skin:   Patient denies skin rash/ lesion and itching.  Eyes:   Patient denies blurred vision and double vision.  Ears/ Nose/ Throat:   Patient denies sore throat and sinus problems.  Hematologic/Lymphatic:   Patient denies swollen glands and easy bruising.  Cardiovascular:   Patient denies leg swelling and chest pains.  Respiratory:   Patient denies cough and shortness of breath.  Endocrine:   Patient denies excessive thirst.  Musculoskeletal:   Patient denies back pain and joint pain.  Neurological:   Patient denies headaches and dizziness.  Psychologic:   Patient denies depression and anxiety.   Notes: hematuria with clots    VITAL SIGNS:      06/24/2019 08:16 AM  BP 176/94 mmHg  Heart Rate 61 /min  Temperature 98.0 F / 36.6 C   GU PHYSICAL EXAMINATION:    Testes: Bilateral testicles descended without masses. Pump is in good position. No evidence of infection. Healing appropriately.  Penis: Circumcised, bilateral cylinders in good position    MULTI-SYSTEM PHYSICAL EXAMINATION:       Complexity of Data:  Lab Test Review:   PSA  Records Review:   Previous Patient Records  Urine Test Review:   Urinalysis   12/22/18 08/17/18 03/25/12  PSA  Total PSA 0.037 ng/mL 0.134 ng/mL 9.02     PROCEDURES:          Urinalysis w/Scope Micro  WBC/hpf: 0 - 5/hpf  RBC/hpf: >60/hpf  Bacteria: Few (10-25/hpf)  Cystals: NS (Not Seen)  Casts: NS (Not Seen)  Trichomonas: Not Present  Mucous: Not Present  Epithelial Cells: NS (Not Seen)  Yeast: NS (Not Seen)  Sperm: Not Present    ASSESSMENT:      ICD-10 Details  1 GU:   Gross hematuria - R31.0 Chronic, Worsening  2   Radiation cystitis (with hematuria) - N30.41 Chronic, Worsening  3   Prostate Cancer - C61 Chronic, Stable  4   ED following radical prostatectomy - N52.31 Chronic, Stable   PLAN:             Medications Stop Meds: Hydrocodone-Acetaminophen 5 mg-325 mg tablet 1 tablet PO Q 6 H PRN  Start: 03/01/2019  Discontinue: 06/24/2019  - Reason: The medication cycle was completed.  Sildenafil Citrate 100 mg tablet 1 tablet PO prn Take 1 tab po 1hr prior to sexual activities Start: 08/13/2018  Discontinue: 06/24/2019  - Reason: The medication cycle was completed.            Schedule Procedure: Unspecified Date - Cysto Fulgurate < 0.5 cm - [id number]          Document Letter(s):  Created for Patient: Clinical Summary         Notes:   Plan to examine in operating room with cystoscopy and fulguration. He will need a PSA in 3 months.   CC: Dr. Radene Ou        Next Appointment:      Next Appointment: 07/09/2019 07:00 AM    Appointment Type: Laboratory Appointment    Location: [facility] [phone]    Provider: Lab LAB    Reason for Visit: 3 mo psa      Signed by Link Snuffer, III, M.D. on 06/24/19 at 8:43 AM (EDT

## 2019-07-07 NOTE — Anesthesia Postprocedure Evaluation (Signed)
Anesthesia Post Note  Patient: MAHIR ALIBERTI  Procedure(s) Performed: CYSTOSCOPY WITH FULGERATION (N/A Bladder)     Patient location during evaluation: PACU Anesthesia Type: General Level of consciousness: awake and alert Pain management: pain level controlled Vital Signs Assessment: post-procedure vital signs reviewed and stable Respiratory status: spontaneous breathing, nonlabored ventilation, respiratory function stable and patient connected to nasal cannula oxygen Cardiovascular status: blood pressure returned to baseline and stable Postop Assessment: no apparent nausea or vomiting Anesthetic complications: no    Last Vitals:  Vitals:   07/07/19 0951 07/07/19 1148  BP: (!) 151/87   Pulse: 60   Resp: 18 11  Temp: 36.9 C   SpO2: 99%     Last Pain:  Vitals:   07/07/19 0951  TempSrc: Oral  PainSc: 4                  Elaiza Shoberg DAVID

## 2019-08-16 ENCOUNTER — Other Ambulatory Visit: Payer: Self-pay

## 2019-08-16 DIAGNOSIS — I10 Essential (primary) hypertension: Secondary | ICD-10-CM

## 2019-08-16 MED ORDER — LISINOPRIL 10 MG PO TABS: 10.0000 mg | ORAL_TABLET | Freq: Every day | ORAL | 2 refills | Status: DC

## 2019-08-16 MED ORDER — ATORVASTATIN CALCIUM 80 MG PO TABS: ORAL_TABLET | ORAL | 2 refills | Status: DC

## 2019-08-16 MED ORDER — METOPROLOL TARTRATE 25 MG PO TABS: 12.5000 mg | ORAL_TABLET | Freq: Two times a day (BID) | ORAL | 2 refills | Status: DC

## 2019-08-16 MED ORDER — NITROGLYCERIN 0.4 MG SL SUBL: 0.4000 mg | SUBLINGUAL_TABLET | SUBLINGUAL | 1 refills | Status: DC | PRN

## 2019-09-13 ENCOUNTER — Encounter: Payer: Self-pay | Admitting: *Deleted

## 2019-10-18 NOTE — Progress Notes (Signed)
HPI: Follow-up coronary artery disease. Admitted August 2019 with non-ST elevation myocardial infarction. Cardiac catheterization at that time showed an 85% distal LAD, 80% proximal RCA, 90% mid RCA, occluded circumflex, occluded marginal. Patient had PCI of his circumflex and circumflex marginal.He subsequently had staged PCI of his right coronary artery. Echocardiogram August 2019 showed ejection fraction 67%, grade 2 diastolic dysfunction. Since last seen,  there is no dyspnea, chest pain, palpitations.  He does have problems with dizziness after bending over and standing back up.  Current Outpatient Medications  Medication Sig Dispense Refill   aspirin EC 81 MG tablet Take 81 mg by mouth daily.     atorvastatin (LIPITOR) 80 MG tablet TAKE 1 TABLET BY MOUTH ONCE DAILY AT  6PM 90 tablet 2   DULoxetine (CYMBALTA) 30 MG capsule Take 30 mg by mouth daily.     gabapentin (NEURONTIN) 300 MG capsule TAKE 1 CAPSULE BY MOUTH THREE TIMES DAILY     glipiZIDE (GLUCOTROL XL) 5 MG 24 hr tablet Take 5 mg by mouth daily.     ibuprofen (ADVIL) 800 MG tablet Take 800 mg by mouth daily.     lisinopril (ZESTRIL) 10 MG tablet Take 1 tablet (10 mg total) by mouth daily. 90 tablet 2   metFORMIN (GLUCOPHAGE) 500 MG tablet Take 1 tablet (500 mg total) by mouth 2 (two) times daily with a meal. 180 tablet 3   metoprolol tartrate (LOPRESSOR) 25 MG tablet Take 0.5 tablets (12.5 mg total) by mouth 2 (two) times daily. (BETA BLOCKER) 90 tablet 2   Misc Natural Products (GLUCOSAMINE CHOND CMP ADVANCED PO) Take by mouth daily.     nitroGLYCERIN (NITROSTAT) 0.4 MG SL tablet Place 1 tablet (0.4 mg total) under the tongue every 5 (five) minutes x 3 doses as needed for chest pain. 75 tablet 1   docusate sodium (COLACE) 100 MG capsule Take 100 mg by mouth 2 (two) times daily as needed. Equate brand  (Patient not taking: Reported on 11/01/2019)     HYDROcodone-acetaminophen (NORCO) 5-325 MG tablet Take 1  tablet by mouth every 4 (four) hours as needed for moderate pain. (Patient not taking: Reported on 11/01/2019) 6 tablet 0   sulfamethoxazole-trimethoprim (BACTRIM DS) 800-160 MG tablet Take 1 tablet by mouth 2 (two) times daily. 14 tablet 0   No current facility-administered medications for this visit.     Past Medical History:  Diagnosis Date   Anxiety    Arthritis    "neck, shoulders, lower back" (09/15/2017)   Carpal tunnel syndrome    Chronic back pain    "neck, lower back" (09/15/2017)   Depression    DVT (deep venous thrombosis) ([place]) ~ 2016   following prostate surgery, treated with course of blood thinners.no blood clot found 2nd [place]   Erectile dysfunction    Fibromyalgia    Former tobacco use    Heart murmur    "born w/one but it closed"   Hematuria    Hyperlipidemia    Hypertension    Positive TB test yrs ago age 46's   Prostate cancer ([place]) 2016   a. s/p prostate surgery.   Sciatica    Spinal stenosis    STEMI (ST elevation myocardial infarction) ([place]) 09/13/2017   PCI/DESx1 to the LAD, with staged intervention DESx1 to the RCA, normal EF   Type II diabetes mellitus [facility])     Past Surgical History:  Procedure Laterality Date   ANTERIOR CERVICAL DECOMP/DISCECTOMY FUSION  03/2015  APPENDECTOMY  1995   BILATERAL CARPAL TUNNEL RELEASE Bilateral 2017   "30 days apart"   CORONARY ANGIOGRAPHY N/A 09/15/2017   Procedure: CORONARY ANGIOGRAPHY;  Surgeon: Jettie Booze, MD;  Location: [place] [facility];  Service: Cardiovascular;  Laterality: N/A;   CORONARY ANGIOPLASTY WITH STENT PLACEMENT  09/15/2017   CORONARY STENT INTERVENTION N/A 09/15/2017   Procedure: CORONARY STENT INTERVENTION;  Surgeon: Jettie Booze, MD;  Location: [place] [facility];  Service: Cardiovascular;  Laterality: N/A;   CORONARY/GRAFT ACUTE MI REVASCULARIZATION N/A 09/13/2017   Procedure: Coronary/Graft Acute MI Revascularization;  Surgeon: Troy Sine, MD;   Location: [place] [facility];  Service: Cardiovascular;  Laterality: N/A;   CYSTOSCOPY WITH FULGERATION N/A 07/07/2019   Procedure: CYSTOSCOPY WITH FULGERATION;  Surgeon: Lucas Mallow, MD;  Location[facility];  Service: Urology;  Laterality: N/A;   LEFT HEART CATH AND CORONARY ANGIOGRAPHY N/A 09/13/2017   Procedure: LEFT HEART CATH AND CORONARY ANGIOGRAPHY;  Surgeon: Troy Sine, MD;  Location: [place] [facility];  Service: Cardiovascular;  Laterality: N/A;   OTHER SURGICAL HISTORY  2003   "genital warts removed"   PENILE PROSTHESIS IMPLANT N/A 02/22/2019   Procedure: PENILE PROTHESIS INFLATABLE COLOPLAST;  Surgeon: Lucas Mallow, MD;  Location: [place];  Service: Urology;  Laterality: N/A;   PROSTATECTOMY  2016    Social History   Socioeconomic History   Marital status: Married    Spouse name: Not on file   Number of children: 8   Years of education: Not on file   Highest education level: Not on file  Occupational History   Occupation: truck driver  Tobacco Use   Smoking status: Former Smoker    Packs/day: 0.12    Years: 3.00    Pack years: 0.36    Types: Cigarettes   Smokeless tobacco: Never Used   Tobacco comment: 09/15/2017 "nothing in the 200s"  Vaping Use   Vaping Use: Never used  Substance and Sexual Activity   Alcohol use: Not Currently   Drug use: Yes    Types: Marijuana    Comment: marijuana used daily   Sexual activity: Not Currently  Other Topics Concern   Not on file  Social History Narrative   He lives with wife and daughter.   He is currently not working since October 2014.  He was working as a Administrator, previously was doing long distance, but now only locally.    Social Determinants of Health   Financial Resource Strain:    Difficulty of Paying Living Expenses: Not on file  Food Insecurity:    Worried About Charity fundraiser in the Last Year: Not on file   [employer] of Food in the  Last Year: Not on file  Transportation Needs:    Lack of Transportation (Medical): Not on file   Lack of Transportation (Non-Medical): Not on file  Physical Activity:    Days of Exercise per Week: Not on file   Minutes of Exercise per Session: Not on file  Stress:    Feeling of Stress : Not on file  Social Connections:    Frequency of Communication with Friends and Family: Not on file   Frequency of Social Gatherings with Friends and Family: Not on file   Attends Religious Services: Not on file   Active Member of Clubs or Organizations: Not on file   Attends Archivist Meetings: Not on file  Marital Status: Not on file  Intimate Partner Violence:    Fear of Current or Ex-Partner: Not on file   Emotionally Abused: Not on file   Physically Abused: Not on file   Sexually Abused: Not on file    Family History  Problem Relation Age of Onset   Diabetes Mother    Arthritis Mother    Heart disease Mother        stents   Diabetes Sister    Cancer Sister    Arthritis Maternal Grandmother     ROS: no fevers or chills, productive cough, hemoptysis, dysphasia, odynophagia, melena, hematochezia, dysuria, hematuria, rash, seizure activity, orthopnea, PND, pedal edema, claudication. Remaining systems are negative.  Physical Exam: Well-developed well-nourished in no acute distress.  Skin is warm and dry.  HEENT is normal.  Neck is supple.  Chest is clear to auscultation with normal expansion.  Cardiovascular exam is regular rate and rhythm.  Abdominal exam nontender or distended. No masses palpated. Extremities show no edema. neuro grossly intact      A/P  1 CAD-No CP; continue ASA and statin.  2 hypertension-patient's blood pressure is elevated.  However he is having orthostatic symptoms as well and I am therefore hesitant to increase antihypertensives.  I have asked him to liberalize fluids.  We will follow blood pressure at home and advance regimen  as needed.  3 hyperlipidemia-continue statin. [name], MD

## 2019-10-26 LAB — LIPID PANEL
Chol/HDL Ratio: 2.4 ratio (ref 0.0–5.0)
Cholesterol, Total: 133 mg/dL (ref 100–199)
HDL: 55 mg/dL (ref >39)
LDL Chol Calc (NIH): 66 mg/dL (ref 0–99)
Triglycerides: 52 mg/dL (ref 0–149)
VLDL Cholesterol Cal: 12 mg/dL (ref 5–40)

## 2019-10-26 LAB — COMPREHENSIVE METABOLIC PANEL
ALT: 15 IU/L (ref 0–44)
AST: 20 IU/L (ref 0–40)
Albumin/Globulin Ratio: 1.2 (ref 1.2–2.2)
Albumin: 4.1 g/dL (ref 3.8–4.9)
Alkaline Phosphatase: 90 IU/L (ref 44–121)
BUN/Creatinine Ratio: 21 — ABNORMAL HIGH (ref 9–20)
BUN: 22 mg/dL (ref 6–24)
Bilirubin Total: 0.4 mg/dL (ref 0.0–1.2)
CO2: 25 mmol/L (ref 20–29)
Calcium: 8.8 mg/dL (ref 8.7–10.2)
Chloride: 102 mmol/L (ref 96–106)
Creatinine, Ser: 1.04 mg/dL (ref 0.76–1.27)
GFR calc Af Amer: 92 mL/min/{1.73_m2} (ref >59)
GFR calc non Af Amer: 79 mL/min/{1.73_m2} (ref >59)
Globulin, Total: 3.3 g/dL (ref 1.5–4.5)
Glucose: 133 mg/dL — ABNORMAL HIGH (ref 65–99)
Potassium: 4.4 mmol/L (ref 3.5–5.2)
Sodium: 139 mmol/L (ref 134–144)
Total Protein: 7.4 g/dL (ref 6.0–8.5)

## 2019-10-29 ENCOUNTER — Encounter: Payer: Self-pay | Admitting: *Deleted

## 2019-11-01 ENCOUNTER — Other Ambulatory Visit: Payer: Self-pay

## 2019-11-01 ENCOUNTER — Encounter: Payer: Self-pay | Admitting: Cardiology

## 2019-11-01 ENCOUNTER — Ambulatory Visit (INDEPENDENT_AMBULATORY_CARE_PROVIDER_SITE_OTHER): Payer: Medicare HMO | Admitting: Cardiology

## 2019-11-01 VITALS — BP 158/82 | HR 68 | Ht 70.0 in | Wt 242.0 lb

## 2019-11-01 DIAGNOSIS — E785 Hyperlipidemia, unspecified: Secondary | ICD-10-CM | POA: Diagnosis not present

## 2019-11-01 DIAGNOSIS — Z9861 Coronary angioplasty status: Secondary | ICD-10-CM

## 2019-11-01 DIAGNOSIS — I10 Essential (primary) hypertension: Secondary | ICD-10-CM

## 2019-11-01 DIAGNOSIS — I2510 Atherosclerotic heart disease of native coronary artery without angina pectoris: Secondary | ICD-10-CM

## 2019-11-01 NOTE — Patient Instructions (Signed)
   Follow-Up: At [facility], you and your health needs are our priority.  As part of our continuing mission to provide you with exceptional heart care, we have created designated Provider Care Teams.  These Care Teams include your primary Cardiologist (physician) and Advanced Practice Providers (APPs -  Physician Assistants and Nurse Practitioners) who all work together to provide you with the care you need, when you need it.  We recommend signing up for the patient portal called "MyChart".  Sign up information is provided on this After Visit Summary.  MyChart is used to connect with patients for Virtual Visits (Telemedicine).  Patients are able to view lab/test results, encounter notes, upcoming appointments, etc.  Non-urgent messages can be sent to your provider as well.   To learn more about what you can do with MyChart, go to [email or web address].    Your next appointment:   12 month(s)  The format for your next appointment:   In Person  Provider:   You may see [name], MD or one of the following Advanced Practice Providers on your designated Care Team:    Kerin Ransom, PA-C  [place], [place]  Coletta Memos, [place]

## 2019-12-07 IMAGING — DX DG CHEST 2V
2 series · 2 of 2 positions shown · non-contrast
Comparison: 11/08/2016

CLINICAL DATA: Chest pain

EXAM:
CHEST - 2 VIEW

[chest pa]
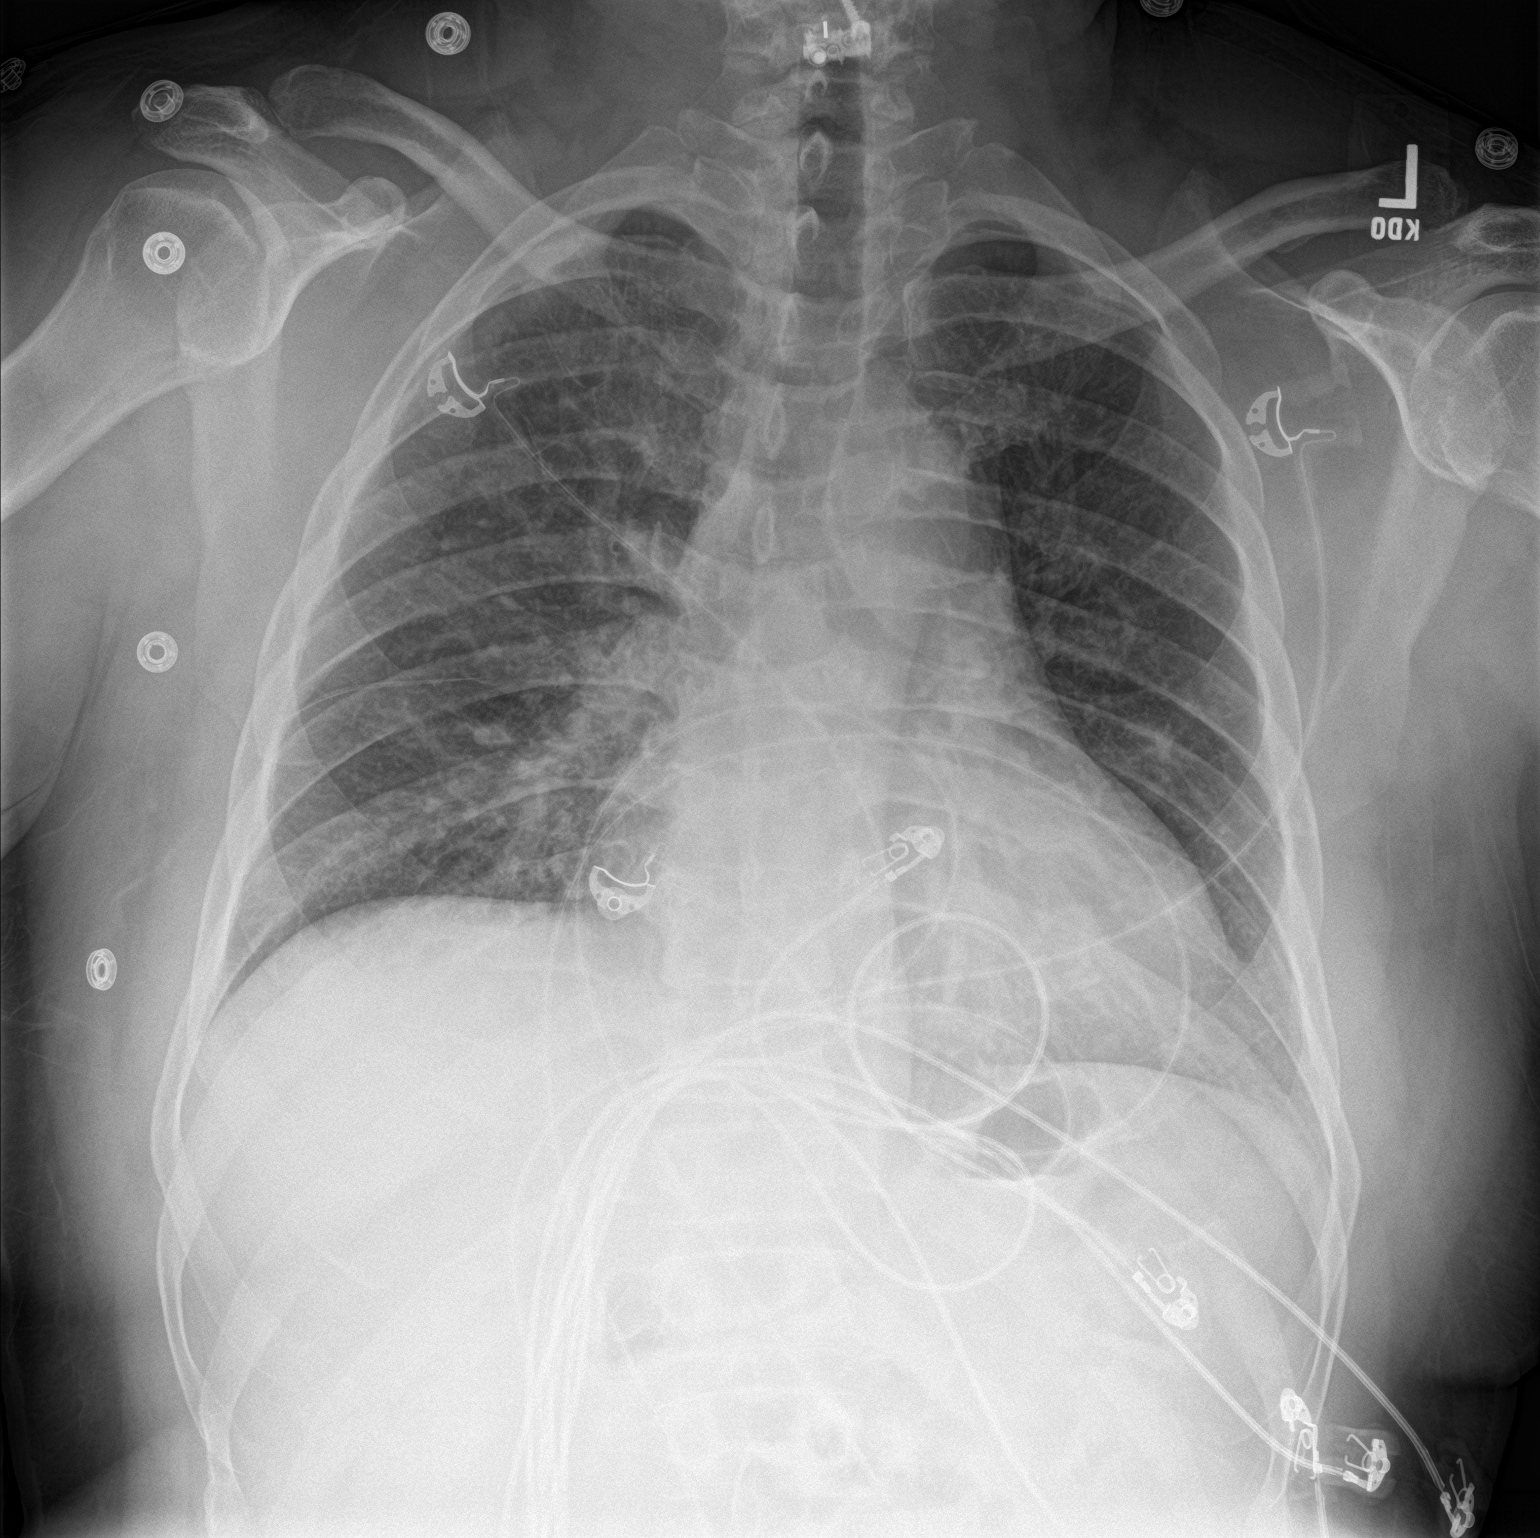

[chest lat]
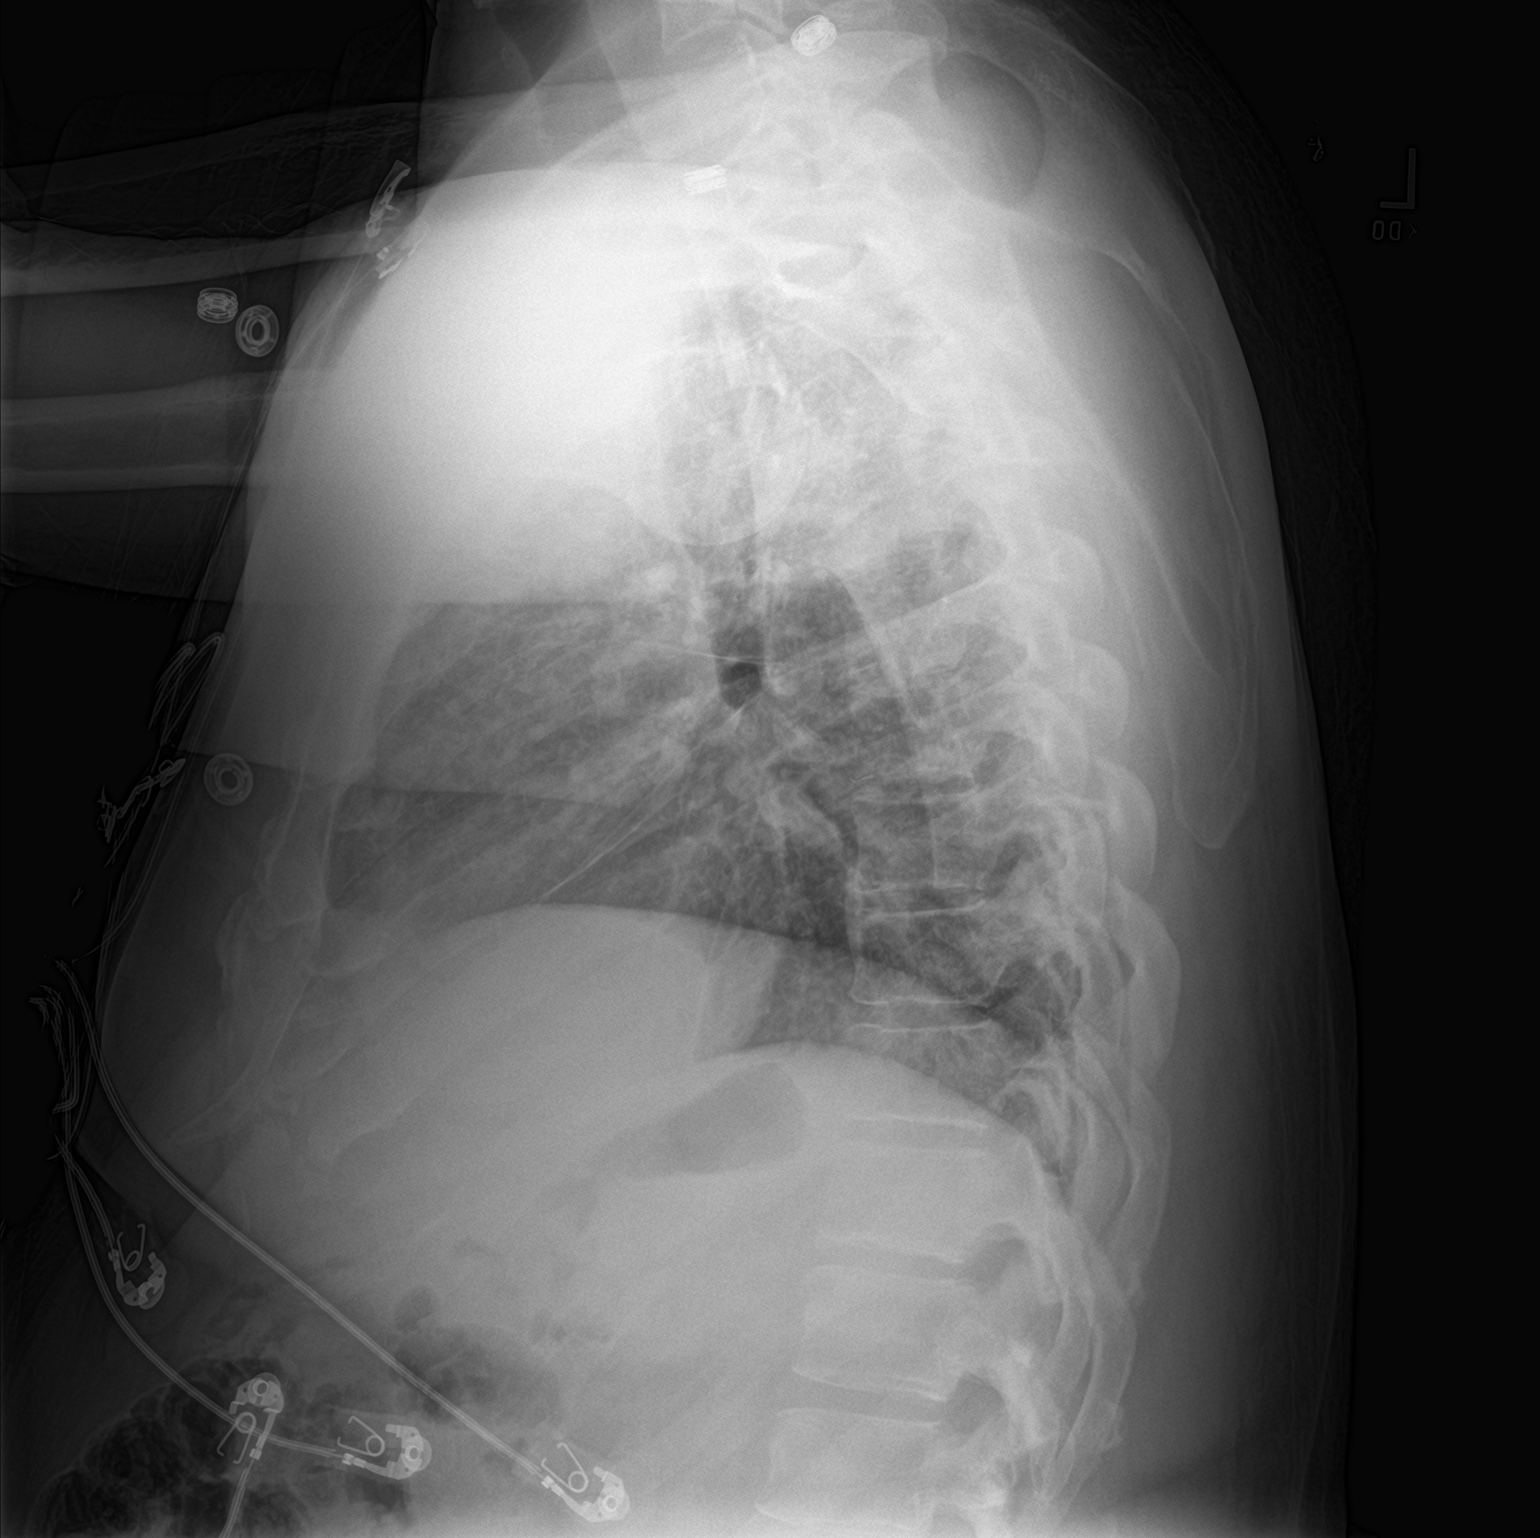

[2 of 2 positions shown; findings below may reference images not displayed]

FINDINGS: Mild cardiomegaly and pulmonary vascular congestion without overt
edema. No pleural effusion or pneumothorax. No focal airspace
consolidation.
IMPRESSION: On cardiomegaly and pulmonary vascular congestion.

## 2019-12-17 ENCOUNTER — Other Ambulatory Visit: Payer: Self-pay

## 2019-12-17 ENCOUNTER — Emergency Department (HOSPITAL_COMMUNITY)
Admission: EM | Admit: 2019-12-17 | Discharge: 2019-12-17 | Disposition: A | Discharge status: Home / Self Care | Payer: Medicare HMO | Attending: Emergency Medicine | Admitting: Emergency Medicine

## 2019-12-17 ENCOUNTER — Encounter (HOSPITAL_COMMUNITY): Payer: Self-pay

## 2019-12-17 DIAGNOSIS — Z8546 Personal history of malignant neoplasm of prostate: Secondary | ICD-10-CM | POA: Diagnosis not present

## 2019-12-17 DIAGNOSIS — Z79899 Other long term (current) drug therapy: Secondary | ICD-10-CM | POA: Insufficient documentation

## 2019-12-17 DIAGNOSIS — Z955 Presence of coronary angioplasty implant and graft: Secondary | ICD-10-CM | POA: Insufficient documentation

## 2019-12-17 DIAGNOSIS — Z7982 Long term (current) use of aspirin: Secondary | ICD-10-CM | POA: Insufficient documentation

## 2019-12-17 DIAGNOSIS — Z87891 Personal history of nicotine dependence: Secondary | ICD-10-CM | POA: Insufficient documentation

## 2019-12-17 DIAGNOSIS — Z7984 Long term (current) use of oral hypoglycemic drugs: Secondary | ICD-10-CM | POA: Insufficient documentation

## 2019-12-17 DIAGNOSIS — I2510 Atherosclerotic heart disease of native coronary artery without angina pectoris: Secondary | ICD-10-CM | POA: Diagnosis not present

## 2019-12-17 DIAGNOSIS — R339 Retention of urine, unspecified: Secondary | ICD-10-CM | POA: Diagnosis not present

## 2019-12-17 DIAGNOSIS — E119 Type 2 diabetes mellitus without complications: Secondary | ICD-10-CM | POA: Insufficient documentation

## 2019-12-17 DIAGNOSIS — I10 Essential (primary) hypertension: Secondary | ICD-10-CM | POA: Diagnosis not present

## 2019-12-17 LAB — CBC WITH DIFFERENTIAL/PLATELET
Abs Immature Granulocytes: 0.03 10*3/uL (ref 0.00–0.07)
Basophils Absolute: 0 10*3/uL (ref 0.0–0.1)
Basophils Relative: 1 %
Eosinophils Absolute: 0.4 10*3/uL (ref 0.0–0.5)
Eosinophils Relative: 6 %
HCT: 31.1 % — ABNORMAL LOW (ref 39.0–52.0)
Hemoglobin: 9.4 g/dL — ABNORMAL LOW (ref 13.0–17.0)
Immature Granulocytes: 1 %
Lymphocytes Relative: 20 %
Lymphs Abs: 1.1 10*3/uL (ref 0.7–4.0)
MCH: 25.6 pg — ABNORMAL LOW (ref 26.0–34.0)
MCHC: 30.2 g/dL (ref 30.0–36.0)
MCV: 84.7 fL (ref 80.0–100.0)
Monocytes Absolute: 0.7 10*3/uL (ref 0.1–1.0)
Monocytes Relative: 13 %
Neutro Abs: 3.4 10*3/uL (ref 1.7–7.7)
Neutrophils Relative %: 59 %
Platelets: 308 10*3/uL (ref 150–400)
RBC: 3.67 MIL/uL — ABNORMAL LOW (ref 4.22–5.81)
RDW: 15.8 % — ABNORMAL HIGH (ref 11.5–15.5)
WBC: 5.7 10*3/uL (ref 4.0–10.5)
nRBC: 0 % (ref 0.0–0.2)

## 2019-12-17 LAB — I-STAT CHEM 8, ED
BUN: 24 mg/dL — ABNORMAL HIGH (ref 6–20)
Calcium, Ion: 1.13 mmol/L — ABNORMAL LOW (ref 1.15–1.40)
Chloride: 110 mmol/L (ref 98–111)
Creatinine, Ser: 1.3 mg/dL — ABNORMAL HIGH (ref 0.61–1.24)
Glucose, Bld: 160 mg/dL — ABNORMAL HIGH (ref 70–99)
HCT: 29 % — ABNORMAL LOW (ref 39.0–52.0)
Hemoglobin: 9.9 g/dL — ABNORMAL LOW (ref 13.0–17.0)
Potassium: 3.4 mmol/L — ABNORMAL LOW (ref 3.5–5.1)
Sodium: 141 mmol/L (ref 135–145)
TCO2: 22 mmol/L (ref 22–32)

## 2019-12-17 NOTE — ED Triage Notes (Signed)
Pt sts penile implant complications and urinary retention since 1400. Pt says he was seen by PCP today for same. Pt covered in sweat.

## 2019-12-17 NOTE — ED Notes (Signed)
Went to talk to patient. Patient states that the foley was not put in with care. Patient also requested to be sedated and md says no. Patient states that it has not hurt like that before.

## 2019-12-17 NOTE — ED Notes (Signed)
Pt received discharge paperwork and left without vital signs being taken. Pt stated he was going to take this to his lawyer because we didn't do anything for him. Pt refused a foley catheter and said we weren't being caring with the coude catheter. Pt understood he left against medical advice.

## 2019-12-17 NOTE — ED Provider Notes (Signed)
[facility] DEPT Provider Note   CSN: [id number] Arrival date & time: 12/17/19  9833     History Chief Complaint  Patient presents with  . Urinary Retention    [name] is a 59 y.o. male.  The history is provided by the patient.  Illness Location:  Bladder  Quality:  Urinary retention since 2 pm  Severity:  Moderate Onset quality:  Gradual Duration:  9 hours Timing:  Constant Progression:  Unchanged Chronicity:  Recurrent Context:  Seen at urology this afternoon for same but was able to void there  Relieved by:  Nothing  Worsened by:  Time  Ineffective treatments:  None Associated symptoms: no chest pain, no congestion, no cough, no diarrhea, no ear pain, no fever, no headaches, no loss of consciousness, no myalgias, no nausea, no rash, no rhinorrhea, no shortness of breath, no sore throat, no vomiting and no wheezing   Risk factors:  Penile impact  Patient seen at urology this afternoon and was able to void but was started on Bactrim and azo.  States he has not voided since.       Past Medical History:  Diagnosis Date  . Anxiety   . Arthritis    "neck, shoulders, lower back" (09/15/2017)  . Carpal tunnel syndrome   . Chronic back pain    "neck, lower back" (09/15/2017)  . Depression   . DVT (deep venous thrombosis) ([place]) ~ 2016   following prostate surgery, treated with course of blood thinners.no blood clot found 2nd [place]  . Erectile dysfunction   . Fibromyalgia   . Former tobacco use   . Heart murmur    "born w/one but it closed"  . Hematuria   . Hyperlipidemia   . Hypertension   . Positive TB test yrs ago age 89's  . Prostate cancer ([place]) 2016   a. s/p prostate surgery.  . Sciatica   . Spinal stenosis   . STEMI (ST elevation myocardial infarction) ([place]) 09/13/2017   PCI/DESx1 to the LAD, with staged intervention DESx1 to the RCA, normal EF  . Type II diabetes mellitus [facility])     Patient Active Problem List    Diagnosis Date Noted  . CAD S/P percutaneous coronary angioplasty 10/01/2017  . NSTEMI (non-ST elevated myocardial infarction) ([place]) 09/13/2017  . Dyslipidemia, goal LDL below 70 09/13/2017  . Chronic pain 09/13/2017  . Mild anemia 09/13/2017  . ST elevation myocardial infarction involving left circumflex coronary artery ([place])   . Depression 04/07/2013  . Unspecified vitamin D deficiency 04/07/2013  . Lower back pain 02/24/2013  . Non-insulin treated type 2 diabetes mellitus ([place]) 02/24/2013    Past Surgical History:  Procedure Laterality Date  . ANTERIOR CERVICAL DECOMP/DISCECTOMY FUSION  03/2015  . APPENDECTOMY  1995  . BILATERAL CARPAL TUNNEL RELEASE Bilateral 2017   "30 days apart"  . CORONARY ANGIOGRAPHY N/A 09/15/2017   Procedure: CORONARY ANGIOGRAPHY;  Surgeon: Jettie Booze, MD;  Location: [place] [facility];  Service: Cardiovascular;  Laterality: N/A;  . CORONARY ANGIOPLASTY WITH STENT PLACEMENT  09/15/2017  . CORONARY STENT INTERVENTION N/A 09/15/2017   Procedure: CORONARY STENT INTERVENTION;  Surgeon: Jettie Booze, MD;  Location: [facility];  Service: Cardiovascular;  Laterality: N/A;  . CORONARY/GRAFT ACUTE MI REVASCULARIZATION N/A 09/13/2017   Procedure: Coronary/Graft Acute MI Revascularization;  Surgeon: Troy Sine, MD;  Location: [place] [facility];  Service: Cardiovascular;  Laterality: N/A;  . CYSTOSCOPY WITH FULGERATION N/A 07/07/2019  Procedure: [place];  Surgeon: Lucas Mallow, MD;  Location: [facility];  Service: Urology;  Laterality: N/A;  . LEFT HEART CATH AND CORONARY ANGIOGRAPHY N/A 09/13/2017   Procedure: LEFT HEART CATH AND CORONARY ANGIOGRAPHY;  Surgeon: Troy Sine, MD;  Location: [place] [facility];  Service: Cardiovascular;  Laterality: N/A;  . OTHER SURGICAL HISTORY  2003   "genital warts removed"  . PENILE PROSTHESIS IMPLANT N/A 02/22/2019   Procedure: PENILE PROTHESIS INFLATABLE  COLOPLAST;  Surgeon: Lucas Mallow, MD;  Location: [facility];  Service: Urology;  Laterality: N/A;  . PROSTATECTOMY  2016       Family History  Problem Relation Age of Onset  . Diabetes Mother   . Arthritis Mother   . Heart disease Mother        stents  . Diabetes Sister   . Cancer Sister   . Arthritis Maternal Grandmother     Social History   Tobacco Use  . Smoking status: Former Smoker    Packs/day: 0.12    Years: 3.00    Pack years: 0.36    Types: Cigarettes  . Smokeless tobacco: Never Used  . Tobacco comment: 09/15/2017 "nothing in the 200s"  Vaping Use  . Vaping Use: Never used  Substance Use Topics  . Alcohol use: Not Currently  . Drug use: Yes    Types: Marijuana    Comment: marijuana used daily    Home Medications Prior to Admission medications   Medication Sig Start Date End Date Taking? Authorizing Provider  aspirin EC 81 MG tablet Take 81 mg by mouth daily.    [provider]  atorvastatin (LIPITOR) 80 MG tablet TAKE 1 TABLET BY MOUTH ONCE DAILY AT  [facility] 08/16/19   Lelon Perla, MD  docusate sodium (COLACE) 100 MG capsule Take 100 mg by mouth 2 (two) times daily as needed. Equate brand  Patient not taking: Reported on 11/01/2019    [provider]  DULoxetine (CYMBALTA) 30 MG capsule Take 30 mg by mouth daily.    [provider]  gabapentin (NEURONTIN) 300 MG capsule TAKE 1 CAPSULE BY MOUTH THREE TIMES DAILY 07/03/18   [provider]  glipiZIDE (GLUCOTROL XL) 5 MG 24 hr tablet Take 5 mg by mouth daily. 09/14/14 09/13/25  [provider]  HYDROcodone-acetaminophen (NORCO) 5-325 MG tablet Take 1 tablet by mouth every 4 (four) hours as needed for moderate pain. Patient not taking: Reported on 11/01/2019 07/07/19   Marton Redwood III, MD  ibuprofen (ADVIL) 800 MG tablet Take 800 mg by mouth daily.    [provider]  lisinopril (ZESTRIL) 10 MG tablet Take 1 tablet (10 mg total) by mouth  daily. 08/16/19   Lelon Perla, MD  metFORMIN (GLUCOPHAGE) 500 MG tablet Take 1 tablet (500 mg total) by mouth 2 (two) times daily with a meal. 02/24/13   Advani, Vernon Prey, MD  metoprolol tartrate (LOPRESSOR) 25 MG tablet Take 0.5 tablets (12.5 mg total) by mouth 2 (two) times daily. (BETA BLOCKER) 08/16/19   Crenshaw, Denice Bors, MD  Misc Natural Products (GLUCOSAMINE CHOND CMP ADVANCED PO) Take by mouth daily.    [provider]  nitroGLYCERIN (NITROSTAT) 0.4 MG SL tablet Place 1 tablet (0.4 mg total) under the tongue every 5 (five) minutes x 3 doses as needed for chest pain. 08/16/19   Lelon Perla, MD  sulfamethoxazole-trimethoprim (BACTRIM DS) 800-160 MG tablet Take 1 tablet by mouth  2 (two) times daily. 07/07/19   Lucas Mallow, MD    Allergies    Cephalexin and Belbuca [buprenorphine hcl]  Review of Systems   Review of Systems  Constitutional: Negative for fever.  HENT: Negative for congestion, ear pain, rhinorrhea and sore throat.   Respiratory: Negative for cough, shortness of breath and wheezing.   Cardiovascular: Negative for chest pain.  Gastrointestinal: Negative for diarrhea, nausea and vomiting.  Genitourinary: Positive for difficulty urinating.  Musculoskeletal: Negative for myalgias.  Skin: Negative for rash.  Neurological: Negative for loss of consciousness and headaches.  Psychiatric/Behavioral: Negative for agitation.  All other systems reviewed and are negative.   Physical Exam Updated Vital Signs BP (!) 162/90 (BP Location: Left Arm)   Pulse 100   Temp (!) 97.5 F (36.4 C) (Oral)   Resp (!) 22   Ht 5\' 10"  (1.778 m)   Wt 113.4 kg   SpO2 98%   BMI 35.87 kg/m   Physical Exam Vitals and nursing note reviewed.  Constitutional:      General: He is not in acute distress.    Appearance: Normal appearance.  HENT:     Head: Normocephalic and atraumatic.     Nose: Nose normal.  Eyes:     Conjunctiva/sclera: Conjunctivae normal.     Pupils:  Pupils are equal, round, and reactive to light.  Cardiovascular:     Rate and Rhythm: Normal rate and regular rhythm.     Pulses: Normal pulses.     Heart sounds: Normal heart sounds.  Pulmonary:     Effort: Pulmonary effort is normal.     Breath sounds: Normal breath sounds.  Abdominal:     General: Abdomen is flat. Bowel sounds are normal.     Palpations: Abdomen is soft.     Tenderness: There is no abdominal tenderness. There is no guarding or rebound.  Musculoskeletal:        General: Normal range of motion.     Cervical back: Normal range of motion and neck supple.  Skin:    General: Skin is warm and dry.     Capillary Refill: Capillary refill takes less than 2 seconds.  Neurological:     General: No focal deficit present.     Mental Status: He is alert and oriented to person, place, and time.  Psychiatric:        Mood and Affect: Mood normal.        Behavior: Behavior normal.     ED Results / Procedures / Treatments   Labs (all labs ordered are listed, but only abnormal results are displayed) Labs Reviewed  CBC WITH DIFFERENTIAL/PLATELET  URINALYSIS, ROUTINE W REFLEX MICROSCOPIC  I-STAT CHEM 8, ED    EKG None  Radiology No results found.  Procedures Procedures (including critical care time)  Medications Ordered in ED Medications - No data to display  ED Course  I have reviewed the triage vital signs and the nursing notes.  Pertinent labs & imaging results that were available during my care of the patient were reviewed by me and considered in my medical decision making (see chart for details).    2 nurses attempted foley placement.  Patient then refused foley catheter in the middle of the procedure and decision not to have the foley catheter and is refusing lab work as well.  He states it has never hurt this bad with previous insertion and is unwilling to try again.  EDP explained the patient could go into  renal failure secondary to inability to urinate.   Patient verbalizes understanding of this and states he needs to think about it.    Nurse informed EDP that patient was leaving AMA.   [name] was evaluated in Emergency Department on 12/17/2019 for the symptoms described in the history of present illness. He was evaluated in the context of the global COVID-19 pandemic, which necessitated consideration that the patient might be at risk for infection with the SARS-CoV-2 virus that causes COVID-19. Institutional protocols and algorithms that pertain to the evaluation of patients at risk for COVID-19 are in a state of rapid change based on information released by regulatory bodies including the CDC and federal and state organizations. These policies and algorithms were followed during the patient's care in the ED.\  Final Clinical Impression(s) / ED Diagnoses Final diagnoses:  Urinary retention   Return for intractable cough, coughing up blood,fevers >100.4 unrelieved by medication, shortness of breath, intractable vomiting, chest pain, shortness of breath, weakness,numbness, changes in speech, facial asymmetry,abdominal pain, passing out,Inability to tolerate liquids or food, cough, altered mental status or any concerns. No signs of systemic illness or infection. The patient is nontoxic-appearing on exam and vital signs are within normal limits.   I have reviewed the triage vital signs and the nursing notes. Pertinent labs &imaging results that were available during my care of the patient were reviewed by me and considered in my medical decision making (see chart for details).After history, exam, and medical workup I feel the patient has beenappropriately medically screened and is safe for discharge home. Pertinent diagnoses were discussed with the patient. Patient was given return precautions.      Elzie Knisley, MD 12/17/19 [id number]

## 2019-12-17 NOTE — ED Notes (Signed)
Attempted to insert an 18F coude catheter and patient said it hurt too bad and to take it out. Pt was tensing up and catheter would not advance any farther. Dr. Randal Buba made aware.

## 2019-12-26 ENCOUNTER — Emergency Department (HOSPITAL_COMMUNITY)
Admission: EM | Admit: 2019-12-26 | Discharge: 2019-12-26 | Disposition: A | Discharge status: Home / Self Care | Payer: Medicare HMO | Attending: Emergency Medicine | Admitting: Emergency Medicine

## 2019-12-26 ENCOUNTER — Other Ambulatory Visit: Payer: Self-pay

## 2019-12-26 ENCOUNTER — Encounter (HOSPITAL_COMMUNITY): Payer: Self-pay | Admitting: Emergency Medicine

## 2019-12-26 ENCOUNTER — Telehealth: Payer: Self-pay

## 2019-12-26 DIAGNOSIS — Z7984 Long term (current) use of oral hypoglycemic drugs: Secondary | ICD-10-CM | POA: Diagnosis not present

## 2019-12-26 DIAGNOSIS — Z79899 Other long term (current) drug therapy: Secondary | ICD-10-CM | POA: Insufficient documentation

## 2019-12-26 DIAGNOSIS — R339 Retention of urine, unspecified: Secondary | ICD-10-CM | POA: Diagnosis present

## 2019-12-26 DIAGNOSIS — E119 Type 2 diabetes mellitus without complications: Secondary | ICD-10-CM | POA: Diagnosis not present

## 2019-12-26 DIAGNOSIS — Z7982 Long term (current) use of aspirin: Secondary | ICD-10-CM | POA: Diagnosis not present

## 2019-12-26 DIAGNOSIS — F1590 Other stimulant use, unspecified, uncomplicated: Secondary | ICD-10-CM | POA: Diagnosis not present

## 2019-12-26 DIAGNOSIS — Z8546 Personal history of malignant neoplasm of prostate: Secondary | ICD-10-CM | POA: Insufficient documentation

## 2019-12-26 DIAGNOSIS — Z87891 Personal history of nicotine dependence: Secondary | ICD-10-CM | POA: Diagnosis not present

## 2019-12-26 DIAGNOSIS — I119 Hypertensive heart disease without heart failure: Secondary | ICD-10-CM | POA: Diagnosis not present

## 2019-12-26 DIAGNOSIS — I2510 Atherosclerotic heart disease of native coronary artery without angina pectoris: Secondary | ICD-10-CM | POA: Diagnosis not present

## 2019-12-26 DIAGNOSIS — R3915 Urgency of urination: Secondary | ICD-10-CM

## 2019-12-26 LAB — URINALYSIS, ROUTINE W REFLEX MICROSCOPIC
Bacteria, UA: NONE SEEN
Bilirubin Urine: NEGATIVE
Glucose, UA: NEGATIVE mg/dL
Ketones, ur: NEGATIVE mg/dL
Nitrite: NEGATIVE
Protein, ur: 30 mg/dL — AB
RBC / HPF: >50 RBC/hpf — ABNORMAL HIGH (ref 0–5)
Specific Gravity, Urine: 1.017 (ref 1.005–1.030)
WBC, UA: >50 WBC/hpf — ABNORMAL HIGH (ref 0–5)
pH: 5 (ref 5.0–8.0)

## 2019-12-26 MED ORDER — CIPROFLOXACIN HCL 500 MG PO TABS
500.0000 mg | ORAL_TABLET | Freq: Once | ORAL | Status: AC
  Administered 2019-12-26: 500 mg via ORAL
  Filled 2019-12-26: qty 1

## 2019-12-26 MED ORDER — LIDOCAINE HCL URETHRAL/MUCOSAL 2 % EX GEL
1.0000 "application " | Freq: Once | CUTANEOUS | Status: AC
  Administered 2019-12-26: 1 via TOPICAL
  Filled 2019-12-26: qty 11

## 2019-12-26 NOTE — ED Triage Notes (Signed)
Pt. Stated unable to pee. Unable to pee since 130 this am.

## 2019-12-26 NOTE — Telephone Encounter (Signed)
Patient called and stated he was supposed to received a antibiotic for a UTi, looking at notes no prescription was ordered, Dr Sabra Heck gone for day, consulted with Ovidio Kin, he read over chart and confirmed that patient should have antibiotic. Called in antibiotic to [facility] and called back patient that it would be available at the pharmacy shortly.

## 2019-12-26 NOTE — Discharge Instructions (Addendum)
Please see your Urologist at your appointment tomorrow at 9;15 - drink average amount of water Take Cipro twice daily - we have given you the first dose here, the next dose will be before bed ER for worsening symptoms.  You are not to have a catheter in your penis.  According to the Urologist, If you need to have your bladder decompressed you would need to have what is called a suprapubic catheter.  Please bring this paperwork with you in the future, if this ever occurs you must tell them that or you may have a complication with your penis.

## 2019-12-26 NOTE — ED Provider Notes (Signed)
[facility] EMERGENCY DEPARTMENT Provider Note   CSN: [id number] Arrival date & time: 12/26/19  0846     History Chief Complaint  Patient presents with  . Urinary Retention    MILAD BUBLITZ is a 59 y.o. male.  HPI   This patient is a 59 year old male, he has a known history of recently placed penile implant, approximately 10 months ago by urology.  He presents to the hospital today with a complaint of urinary retention.  A review of the medical record shows that the patient had been seen in November 2021 during which time on the 12th approximately 9 days ago, he had urinary retention, had his bladder drained with a 10 [place] Foley which was placed as a straight cath but unable to be inflated.  He has done okay since that time but then again this morning was unable to urinate after 1:00 AM, at this time he has intense pressure in his lower abdomen, he has no nausea or vomiting but feels very sweaty, he states this is similar to what happened to him 9 days ago.  The patient had actually been seen by the urologist prior to his previous visit and started on Bactrim and AZO, however, he still required in and out drainage.  No fever  Past Medical History:  Diagnosis Date  . Anxiety   . Arthritis    "neck, shoulders, lower back" (09/15/2017)  . Carpal tunnel syndrome   . Chronic back pain    "neck, lower back" (09/15/2017)  . Depression   . DVT (deep venous thrombosis) ([place]) ~ 2016   following prostate surgery, treated with course of blood thinners.no blood clot found 2nd [place]  . Erectile dysfunction   . Fibromyalgia   . Former tobacco use   . Heart murmur    "born w/one but it closed"  . Hematuria   . Hyperlipidemia   . Hypertension   . Positive TB test yrs ago age 45's  . Prostate cancer ([place]) 2016   a. s/p prostate surgery.  . Sciatica   . Spinal stenosis   . STEMI (ST elevation myocardial infarction) ([place]) 09/13/2017   PCI/DESx1 to the LAD, with staged  intervention DESx1 to the RCA, normal EF  . Type II diabetes mellitus [facility])     Patient Active Problem List   Diagnosis Date Noted  . CAD S/P percutaneous coronary angioplasty 10/01/2017  . NSTEMI (non-ST elevated myocardial infarction) ([place]) 09/13/2017  . Dyslipidemia, goal LDL below 70 09/13/2017  . Chronic pain 09/13/2017  . Mild anemia 09/13/2017  . ST elevation myocardial infarction involving left circumflex coronary artery ([place])   . Depression 04/07/2013  . Unspecified vitamin D deficiency 04/07/2013  . Lower back pain 02/24/2013  . Non-insulin treated type 2 diabetes mellitus ([place]) 02/24/2013    Past Surgical History:  Procedure Laterality Date  . ANTERIOR CERVICAL DECOMP/DISCECTOMY FUSION  03/2015  . APPENDECTOMY  1995  . BILATERAL CARPAL TUNNEL RELEASE Bilateral 2017   "30 days apart"  . CORONARY ANGIOGRAPHY N/A 09/15/2017   Procedure: CORONARY ANGIOGRAPHY;  Surgeon: Jettie Booze, MD;  Location: [place] CV LAB;  Service: Cardiovascular;  Laterality: N/A;  . CORONARY ANGIOPLASTY WITH STENT PLACEMENT  09/15/2017  . CORONARY STENT INTERVENTION N/A 09/15/2017   Procedure: CORONARY STENT INTERVENTION;  Surgeon: Jettie Booze, MD;  Location: [place] CV [facility];  Service: Cardiovascular;  Laterality: N/A;  . CORONARY/GRAFT ACUTE MI REVASCULARIZATION N/A 09/13/2017   Procedure: Coronary/Graft Acute MI Revascularization;  Surgeon: Troy Sine, MD;  Location: [facility];  Service: Cardiovascular;  Laterality: N/A;  . CYSTOSCOPY WITH FULGERATION N/A 07/07/2019   Procedure: CYSTOSCOPY WITH FULGERATION;  Surgeon: Lucas Mallow, MD;  Location: [facility];  Service: Urology;  Laterality: N/A;  . LEFT HEART CATH AND CORONARY ANGIOGRAPHY N/A 09/13/2017   Procedure: LEFT HEART CATH AND CORONARY ANGIOGRAPHY;  Surgeon: Troy Sine, MD;  Location: [place] [facility];  Service: Cardiovascular;  Laterality: N/A;  . OTHER SURGICAL HISTORY  2003    "genital warts removed"  . PENILE PROSTHESIS IMPLANT N/A 02/22/2019   Procedure: PENILE PROTHESIS INFLATABLE COLOPLAST;  Surgeon: Lucas Mallow, MD;  Location: [facility];  Service: Urology;  Laterality: N/A;  . PROSTATECTOMY  2016       Family History  Problem Relation Age of Onset  . Diabetes Mother   . Arthritis Mother   . Heart disease Mother        stents  . Diabetes Sister   . Cancer Sister   . Arthritis Maternal Grandmother     Social History   Tobacco Use  . Smoking status: Former Smoker    Packs/day: 0.12    Years: 3.00    Pack years: 0.36    Types: Cigarettes  . Smokeless tobacco: Never Used  . Tobacco comment: 09/15/2017 "nothing in the 200s"  Vaping Use  . Vaping Use: Never used  Substance Use Topics  . Alcohol use: Not Currently  . Drug use: Yes    Types: Marijuana    Comment: marijuana used daily    Home Medications Prior to Admission medications   Medication Sig Start Date End Date Taking? Authorizing Provider  aspirin EC 81 MG tablet Take 81 mg by mouth daily.    [provider]  atorvastatin (LIPITOR) 80 MG tablet TAKE 1 TABLET BY MOUTH ONCE DAILY AT  [facility] 08/16/19   Lelon Perla, MD  docusate sodium (COLACE) 100 MG capsule Take 100 mg by mouth 2 (two) times daily as needed. Equate brand  Patient not taking: Reported on 11/01/2019    [provider]  DULoxetine (CYMBALTA) 30 MG capsule Take 30 mg by mouth daily.    [provider]  gabapentin (NEURONTIN) 300 MG capsule TAKE 1 CAPSULE BY MOUTH THREE TIMES DAILY 07/03/18   [provider]  glipiZIDE (GLUCOTROL XL) 5 MG 24 hr tablet Take 5 mg by mouth daily. 09/14/14 09/13/25  [provider]  HYDROcodone-acetaminophen (NORCO) 5-325 MG tablet Take 1 tablet by mouth every 4 (four) hours as needed for moderate pain. Patient not taking: Reported on 11/01/2019 07/07/19   Marton Redwood III, MD  ibuprofen (ADVIL) 800 MG tablet Take 800 mg by mouth  daily.    [provider]  lisinopril (ZESTRIL) 10 MG tablet Take 1 tablet (10 mg total) by mouth daily. 08/16/19   Lelon Perla, MD  metFORMIN (GLUCOPHAGE) 500 MG tablet Take 1 tablet (500 mg total) by mouth 2 (two) times daily with a meal. 02/24/13   Advani, Vernon Prey, MD  metoprolol tartrate (LOPRESSOR) 25 MG tablet Take 0.5 tablets (12.5 mg total) by mouth 2 (two) times daily. (BETA BLOCKER) 08/16/19   Crenshaw, Denice Bors, MD  Misc Natural Products (GLUCOSAMINE CHOND CMP ADVANCED PO) Take by mouth daily.    [provider]  nitroGLYCERIN (NITROSTAT) 0.4 MG SL tablet Place 1 tablet (0.4 mg total) under the tongue every 5 (five) minutes x  3 doses as needed for chest pain. 08/16/19   Lelon Perla, MD  sulfamethoxazole-trimethoprim (BACTRIM DS) 800-160 MG tablet Take 1 tablet by mouth 2 (two) times daily. 07/07/19   Lucas Mallow, MD    Allergies    Cephalexin and Belbuca [buprenorphine hcl]  Review of Systems   Review of Systems  Gastrointestinal: Positive for abdominal pain. Negative for nausea and vomiting.  Endocrine: Negative for polyuria.  Genitourinary: Positive for difficulty urinating.    Physical Exam Updated Vital Signs BP (!) 156/115 (BP Location: Right Arm)   Pulse 69   Temp 98.6 F (37 C) (Oral)   Resp (!) 25   SpO2 100%   Physical Exam Vitals and nursing note reviewed.  Constitutional:      Appearance: He is well-developed. He is not diaphoretic.  HENT:     Head: Normocephalic and atraumatic.  Eyes:     General:        Right eye: No discharge.        Left eye: No discharge.     Conjunctiva/sclera: Conjunctivae normal.  Pulmonary:     Effort: Pulmonary effort is normal. No respiratory distress.  Abdominal:     Comments: Lower abdominal tenderness  Genitourinary:    Comments: Normal-appearing penis, implant able to be palpated, no obvious deformities, small amount of urine at the urethral meatus, no blood visualized, normal scrotum and  testicles Skin:    General: Skin is warm and dry.     Findings: No erythema or rash.  Neurological:     Mental Status: He is alert.     Coordination: Coordination normal.     ED Results / Procedures / Treatments   Labs (all labs ordered are listed, but only abnormal results are displayed) Labs Reviewed  URINE CULTURE  URINALYSIS, ROUTINE W REFLEX MICROSCOPIC    EKG None  Radiology No results found.  Procedures Procedures (including critical care time)  Medications Ordered in ED Medications  ciprofloxacin (CIPRO) tablet 500 mg (has no administration in time range)  lidocaine (XYLOCAINE) 2 % jelly 1 application (1 application Topical Given 12/26/19 0955)    ED Course  I have reviewed the triage vital signs and the nursing notes.  Pertinent labs & imaging results that were available during my care of the patient were reviewed by me and considered in my medical decision making (see chart for details).    MDM Rules/Calculators/A&P                          Ordered lidocaine and Foley catheter, will need to check urinalysis, will need likely consultation with urology given this patient's complicated postop course with possible complicating urinary retention.  This patient is a diabetic.  Foley wouldn't pass the base of the penis.  Will consult with Urology  D/w Dr. Abner Greenspan - there is only around 50 cc in the bladder both by bladder scan and by my bedside US - he has no fever and no tachcayrdia - will do UA to r/o infection - abx if infected - Dr. Abner Greenspan suggests no Foley catheter in the future - if he had true retention would need SP catheter to prevent erosion of the prosthesis.  Pt updated and in agreement.  Pt aware of no foley in future -  Pt feels better - trickling urine now cipro given prior to d/c Has f/u tomorrow with uro in [place] at 9:15. Pt declines ditropan.   Final  Clinical Impression(s) / ED Diagnoses Final diagnoses:  Urinary urgency    Rx / DC  Orders ED Discharge Orders    None       Noemi Chapel, MD 12/26/19 1002

## 2019-12-26 NOTE — ED Notes (Signed)
Pt in obvious discomfort "unable to pee". Attempted to pass urinary catheter 14g and 12g. catheters. Unable to pass either catheter. MD aware. Bladder scan shows 41ml urine. MD on phone consulting Urology. Pt did voice "some relief after voiding while this nurse attempted to pass catheter. Urine collected for UA and culture.

## 2019-12-27 ENCOUNTER — Telehealth (HOSPITAL_COMMUNITY): Payer: Self-pay | Admitting: Emergency Medicine

## 2019-12-27 MED ORDER — CIPROFLOXACIN HCL 500 MG PO TABS: 500.0000 mg | ORAL_TABLET | Freq: Two times a day (BID) | ORAL | 0 refills | Status: DC

## 2019-12-27 NOTE — Telephone Encounter (Signed)
cipro Rx sent to [facility]

## 2019-12-29 ENCOUNTER — Emergency Department (HOSPITAL_COMMUNITY): Payer: Medicare HMO

## 2019-12-29 ENCOUNTER — Encounter (HOSPITAL_COMMUNITY): Payer: Self-pay

## 2019-12-29 ENCOUNTER — Other Ambulatory Visit: Payer: Self-pay

## 2019-12-29 ENCOUNTER — Emergency Department (HOSPITAL_COMMUNITY)
Admission: EM | Admit: 2019-12-29 | Discharge: 2019-12-29 | Disposition: A | Discharge status: Home / Self Care | Payer: Medicare HMO | Attending: Emergency Medicine | Admitting: Emergency Medicine

## 2019-12-29 DIAGNOSIS — E1169 Type 2 diabetes mellitus with other specified complication: Secondary | ICD-10-CM | POA: Insufficient documentation

## 2019-12-29 DIAGNOSIS — M545 Low back pain, unspecified: Secondary | ICD-10-CM | POA: Diagnosis not present

## 2019-12-29 DIAGNOSIS — Z87891 Personal history of nicotine dependence: Secondary | ICD-10-CM | POA: Insufficient documentation

## 2019-12-29 DIAGNOSIS — Z7982 Long term (current) use of aspirin: Secondary | ICD-10-CM | POA: Insufficient documentation

## 2019-12-29 DIAGNOSIS — N4889 Other specified disorders of penis: Secondary | ICD-10-CM | POA: Insufficient documentation

## 2019-12-29 DIAGNOSIS — Z79899 Other long term (current) drug therapy: Secondary | ICD-10-CM | POA: Diagnosis not present

## 2019-12-29 DIAGNOSIS — G8929 Other chronic pain: Secondary | ICD-10-CM | POA: Insufficient documentation

## 2019-12-29 DIAGNOSIS — Z955 Presence of coronary angioplasty implant and graft: Secondary | ICD-10-CM | POA: Insufficient documentation

## 2019-12-29 DIAGNOSIS — I2510 Atherosclerotic heart disease of native coronary artery without angina pectoris: Secondary | ICD-10-CM | POA: Diagnosis not present

## 2019-12-29 DIAGNOSIS — I10 Essential (primary) hypertension: Secondary | ICD-10-CM | POA: Diagnosis not present

## 2019-12-29 DIAGNOSIS — Z8546 Personal history of malignant neoplasm of prostate: Secondary | ICD-10-CM | POA: Insufficient documentation

## 2019-12-29 DIAGNOSIS — R339 Retention of urine, unspecified: Secondary | ICD-10-CM | POA: Diagnosis not present

## 2019-12-29 DIAGNOSIS — E785 Hyperlipidemia, unspecified: Secondary | ICD-10-CM | POA: Insufficient documentation

## 2019-12-29 DIAGNOSIS — R102 Pelvic and perineal pain: Secondary | ICD-10-CM

## 2019-12-29 DIAGNOSIS — Z7984 Long term (current) use of oral hypoglycemic drugs: Secondary | ICD-10-CM | POA: Diagnosis not present

## 2019-12-29 DIAGNOSIS — R1030 Lower abdominal pain, unspecified: Secondary | ICD-10-CM | POA: Diagnosis not present

## 2019-12-29 LAB — URINE CULTURE: Culture: 30000 — AB

## 2019-12-29 LAB — URINALYSIS, ROUTINE W REFLEX MICROSCOPIC
Bilirubin Urine: NEGATIVE
Glucose, UA: NEGATIVE mg/dL
Ketones, ur: NEGATIVE mg/dL
Nitrite: NEGATIVE
Protein, ur: 100 mg/dL — AB
Specific Gravity, Urine: 1.014 (ref 1.005–1.030)
WBC, UA: >50 WBC/hpf — ABNORMAL HIGH (ref 0–5)
pH: 5 (ref 5.0–8.0)

## 2019-12-29 MED ORDER — HYDROMORPHONE HCL 1 MG/ML IJ SOLN
1.0000 mg | Freq: Once | INTRAMUSCULAR | Status: AC
  Administered 2019-12-29: 1 mg via INTRAVENOUS
  Filled 2019-12-29: qty 1

## 2019-12-29 MED ORDER — ACETAMINOPHEN ER 650 MG PO TBCR: 650.0000 mg | EXTENDED_RELEASE_TABLET | Freq: Three times a day (TID) | ORAL | 0 refills | Status: AC | PRN

## 2019-12-29 MED ORDER — HYDROCODONE-ACETAMINOPHEN 5-325 MG PO TABS
1.0000 | ORAL_TABLET | Freq: Four times a day (QID) | ORAL | 0 refills | Status: DC | PRN
Start: 2019-12-29 — End: 2020-11-07

## 2019-12-29 MED ORDER — MORPHINE SULFATE (PF) 4 MG/ML IV SOLN
4.0000 mg | Freq: Once | INTRAVENOUS | Status: AC
  Administered 2019-12-29: 4 mg via INTRAVENOUS
  Filled 2019-12-29: qty 1

## 2019-12-29 MED ORDER — OXYBUTYNIN CHLORIDE ER 10 MG PO TB24
10.0000 mg | ORAL_TABLET | Freq: Once | ORAL | Status: AC
  Administered 2019-12-29: 10 mg via ORAL
  Filled 2019-12-29: qty 1

## 2019-12-29 MED ORDER — FENTANYL CITRATE (PF) 100 MCG/2ML IJ SOLN
50.0000 ug | Freq: Once | INTRAMUSCULAR | Status: AC
  Administered 2019-12-29: 50 ug via INTRAVENOUS
  Filled 2019-12-29: qty 2

## 2019-12-29 MED ORDER — OXYBUTYNIN CHLORIDE ER 10 MG PO TB24: 10.0000 mg | ORAL_TABLET | Freq: Every day | ORAL | 0 refills | Status: DC

## 2019-12-29 NOTE — ED Provider Notes (Signed)
[place] EMERGENCY DEPARTMENT Provider Note   CSN: [id number] Arrival date & time: 12/29/19  9767     History Chief Complaint  Patient presents with  . Urinary Retention    [name] is a 59 y.o. male with a past medical history significant for anxiety, chronic low back pain, depression, history of DVT not currently on any anticoagulants, fibromyalgia, hyperlipidemia, hypertension, history of STEMI, and type 2 diabetes who presents to the ED due to urinary retention associated with suprapubic pain. Patient describes pain as a stabbing sensation that is nonradiating in nature, located in suprapubic region and base of penis. Denies associated fever and chills. Patient recently had a penile implant placed approximately 10 months ago by urology. Patient has a scheduled transurethral resection with bladder neck incision scheduled for 01/04/2020 by Dr. Amalia Hailey with [facility] urology. Patient notes he last urinated a small amount at 2 AM. He rates his pain a 10/10. No treatment prior to arrival. Patient has been seen in the ED on 11/21 and 11/12 for the same complaint. On 11/21, patient was treated for a UTI with Ciprofloxacin which he has been compliant with. Patient saw Dr. Amalia Hailey on 11/22 for surgery consult. He has known radiation cystitis. Patient denies associated chest pain, nausea, vomiting, diarrhea, back pain, testicular pain, penile discharge, hematuria, pain with defection. Denies concern for STDs.   Chart review. While patient was seen in the ED on 11/21 Dr. Abner Greenspan with urology was consulted who suggested no future foley catheters, but if true retention would need SP catheter to prevent erosion of the prosthesis.   History obtained from patient and past medical records. No interpreter used during encounter.      Past Medical History:  Diagnosis Date  . Anxiety   . Arthritis    "neck, shoulders, lower back" (09/15/2017)  . Carpal tunnel syndrome   . Chronic  back pain    "neck, lower back" (09/15/2017)  . Depression   . DVT (deep venous thrombosis) ([place]) ~ 2016   following prostate surgery, treated with course of blood thinners.no blood clot found 2nd [place]  . Erectile dysfunction   . Fibromyalgia   . Former tobacco use   . Heart murmur    "born w/one but it closed"  . Hematuria   . Hyperlipidemia   . Hypertension   . Positive TB test yrs ago age 77's  . Prostate cancer ([place]) 2016   a. s/p prostate surgery.  . Sciatica   . Spinal stenosis   . STEMI (ST elevation myocardial infarction) ([place]) 09/13/2017   PCI/DESx1 to the LAD, with staged intervention DESx1 to the RCA, normal EF  . Type II diabetes mellitus [facility])     Patient Active Problem List   Diagnosis Date Noted  . CAD S/P percutaneous coronary angioplasty 10/01/2017  . NSTEMI (non-ST elevated myocardial infarction) ([place]) 09/13/2017  . Dyslipidemia, goal LDL below 70 09/13/2017  . Chronic pain 09/13/2017  . Mild anemia 09/13/2017  . ST elevation myocardial infarction involving left circumflex coronary artery ([place])   . Depression 04/07/2013  . Unspecified vitamin D deficiency 04/07/2013  . Lower back pain 02/24/2013  . Non-insulin treated type 2 diabetes mellitus ([place]) 02/24/2013    Past Surgical History:  Procedure Laterality Date  . ANTERIOR CERVICAL DECOMP/DISCECTOMY FUSION  03/2015  . APPENDECTOMY  1995  . BILATERAL CARPAL TUNNEL RELEASE Bilateral 2017   "30 days apart"  . CORONARY ANGIOGRAPHY N/A 09/15/2017   Procedure: CORONARY ANGIOGRAPHY;  Surgeon: Jettie Booze, MD;  Location: [place] CV LAB;  Service: Cardiovascular;  Laterality: N/A;  . CORONARY ANGIOPLASTY WITH STENT PLACEMENT  09/15/2017  . CORONARY STENT INTERVENTION N/A 09/15/2017   Procedure: CORONARY STENT INTERVENTION;  Surgeon: Jettie Booze, MD;  Location: [place] CV LAB;  Service: Cardiovascular;  Laterality: N/A;  . CORONARY/GRAFT ACUTE MI REVASCULARIZATION N/A 09/13/2017    Procedure: Coronary/Graft Acute MI Revascularization;  Surgeon: Troy Sine, MD;  Location: [place] CV LAB;  Service: Cardiovascular;  Laterality: N/A;  . CYSTOSCOPY WITH FULGERATION N/A 07/07/2019   Procedure: CYSTOSCOPY WITH FULGERATION;  Surgeon: Lucas Mallow, MD;  Location: [facility];  Service: Urology;  Laterality: N/A;  . LEFT HEART CATH AND CORONARY ANGIOGRAPHY N/A 09/13/2017   Procedure: LEFT HEART CATH AND CORONARY ANGIOGRAPHY;  Surgeon: Troy Sine, MD;  Location: [place] CV LAB;  Service: Cardiovascular;  Laterality: N/A;  . OTHER SURGICAL HISTORY  2003   "genital warts removed"  . PENILE PROSTHESIS IMPLANT N/A 02/22/2019   Procedure: PENILE PROTHESIS INFLATABLE COLOPLAST;  Surgeon: Lucas Mallow, MD;  Location: [facility];  Service: Urology;  Laterality: N/A;  . PROSTATECTOMY  2016       Family History  Problem Relation Age of Onset  . Diabetes Mother   . Arthritis Mother   . Heart disease Mother        stents  . Diabetes Sister   . Cancer Sister   . Arthritis Maternal Grandmother     Social History   Tobacco Use  . Smoking status: Former Smoker    Packs/day: 0.12    Years: 3.00    Pack years: 0.36    Types: Cigarettes  . Smokeless tobacco: Never Used  . Tobacco comment: 09/15/2017 "nothing in the 200s"  Vaping Use  . Vaping Use: Never used  Substance Use Topics  . Alcohol use: Not Currently  . Drug use: Yes    Types: Marijuana    Comment: marijuana used daily    Home Medications Prior to Admission medications   Medication Sig Start Date End Date Taking? Authorizing Provider  aspirin EC 81 MG tablet Take 81 mg by mouth daily.   Yes [provider]  atorvastatin (LIPITOR) 80 MG tablet TAKE 1 TABLET BY MOUTH ONCE DAILY AT  6PM Patient taking differently: Take 80 mg by mouth every evening.  08/16/19  Yes Lelon Perla, MD  ciprofloxacin (CIPRO) 500 MG tablet Take 1 tablet (500 mg total) by  mouth 2 (two) times daily. Patient taking differently: Take 500 mg by mouth 2 (two) times daily. Patient wasn't able to verbalize course and how many days he's supposed to take it for. 12/27/19  Yes Noemi Chapel, MD  docusate sodium (COLACE) 100 MG capsule Take 100 mg by mouth 2 (two) times daily as needed. Equate brand    Yes [provider]  DULoxetine (CYMBALTA) 30 MG capsule Take 30 mg by mouth daily.   Yes [provider]  gabapentin (NEURONTIN) 300 MG capsule Take 300 mg by mouth 3 (three) times daily.  07/03/18  Yes [provider]  glipiZIDE (GLUCOTROL XL) 5 MG 24 hr tablet Take 5 mg by mouth daily. 09/14/14 09/13/25 Yes [provider]  ibuprofen (ADVIL) 800 MG tablet Take 800 mg by mouth daily.   Yes [provider]  lisinopril (ZESTRIL) 10 MG tablet Take 1 tablet (10 mg total) by mouth daily. 08/16/19  Yes Crenshaw,  Denice Bors, MD  metFORMIN (GLUCOPHAGE) 500 MG tablet Take 1 tablet (500 mg total) by mouth 2 (two) times daily with a meal. 02/24/13  Yes Advani, Deepak, MD  metoprolol tartrate (LOPRESSOR) 25 MG tablet Take 0.5 tablets (12.5 mg total) by mouth 2 (two) times daily. (BETA BLOCKER) 08/16/19  Yes Crenshaw, Denice Bors, MD  Misc Natural Products (GLUCOSAMINE CHOND CMP ADVANCED PO) Take by mouth daily.   Yes [provider]  nitroGLYCERIN (NITROSTAT) 0.4 MG SL tablet Place 1 tablet (0.4 mg total) under the tongue every 5 (five) minutes x 3 doses as needed for chest pain. 08/16/19  Yes Lelon Perla, MD  HYDROcodone-acetaminophen (NORCO) 5-325 MG tablet Take 1 tablet by mouth every 4 (four) hours as needed for moderate pain. Patient not taking: Reported on 11/01/2019 07/07/19   Marton Redwood III, MD  oxybutynin (DITROPAN XL) 10 MG 24 hr tablet Take 1 tablet (10 mg total) by mouth at bedtime. 12/29/19   Suzy Bouchard, PA-C  sulfamethoxazole-trimethoprim (BACTRIM DS) 800-160 MG tablet Take 1 tablet by mouth 2 (two) times daily. Patient  not taking: Reported on 12/29/2019 07/07/19   Lucas Mallow, MD    Allergies    Cephalexin and Belbuca [buprenorphine hcl]  Review of Systems   Review of Systems  Constitutional: Negative for chills and fever.  Respiratory: Negative for shortness of breath.   Cardiovascular: Negative for chest pain.  Gastrointestinal: Positive for abdominal pain (suprapubic region). Negative for abdominal distention, diarrhea, nausea and vomiting.  Genitourinary: Positive for decreased urine volume, difficulty urinating, dysuria and penile pain (base of penis). Negative for flank pain, penile swelling, scrotal swelling and testicular pain.  Musculoskeletal: Negative for back pain.  Skin: Negative for rash.  Neurological: Negative for headaches.  All other systems reviewed and are negative.   Physical Exam Updated Vital Signs BP 133/88 (BP Location: Right Arm)   Pulse 74   Temp 97.9 F (36.6 C) (Oral)   Resp 19   Ht 5\' 10"  (1.778 m)   Wt 113.4 kg   SpO2 100%   BMI 35.87 kg/m   Physical Exam Vitals and nursing note reviewed.  Constitutional:      General: He is not in acute distress.    Appearance: He is not ill-appearing.     Comments: Appears to be very uncomfortable in bed  HENT:     Head: Normocephalic.  Eyes:     Pupils: Pupils are equal, round, and reactive to light.  Cardiovascular:     Rate and Rhythm: Normal rate and regular rhythm.     Pulses: Normal pulses.     Heart sounds: Normal heart sounds. No murmur heard.  No friction rub. No gallop.   Pulmonary:     Effort: Pulmonary effort is normal.     Breath sounds: Normal breath sounds.  Abdominal:     General: Abdomen is flat. Bowel sounds are normal. There is no distension.     Palpations: Abdomen is soft.     Tenderness: There is abdominal tenderness. There is no right CVA tenderness, left CVA tenderness, guarding or rebound.     Comments: Suprapubic tenderness. No rebound or guarding. No distention.    Genitourinary:    Comments: Normal appearing, circumcised penis. Palpable implant. No penile discharge or blood. No testicular edema or tenderness. No lesions.  Musculoskeletal:     Cervical back: Neck supple.     Comments: Able to move all 4 extremities without difficulty.   Skin:  General: Skin is warm and dry.  Neurological:     General: No focal deficit present.     Mental Status: He is alert.  Psychiatric:        Mood and Affect: Mood normal.        Behavior: Behavior normal.     ED Results / Procedures / Treatments   Labs (all labs ordered are listed, but only abnormal results are displayed) Labs Reviewed  URINALYSIS, ROUTINE W REFLEX MICROSCOPIC - Abnormal; Notable for the following components:      Result Value   APPearance CLOUDY (*)    Hgb urine dipstick SMALL (*)    Protein, ur 100 (*)    Leukocytes,Ua LARGE (*)    WBC, UA >50 (*)    Bacteria, UA RARE (*)    All other components within normal limits  URINE CULTURE    EKG None  Radiology No results found.  Procedures Procedures (including critical care time)  Medications Ordered in ED Medications  oxybutynin (DITROPAN-XL) 24 hr tablet 10 mg (has no administration in time range)  HYDROmorphone (DILAUDID) injection 1 mg (has no administration in time range)  fentaNYL (SUBLIMAZE) injection 50 mcg (50 mcg Intravenous Given 12/29/19 0825)  morphine 4 MG/ML injection 4 mg (4 mg Intravenous Given 12/29/19 0948)    ED Course  I have reviewed the triage vital signs and the nursing notes.  Pertinent labs & imaging results that were available during my care of the patient were reviewed by me and considered in my medical decision making (see chart for details).  Clinical Course as of Dec 28 1128  Wed Dec 29, 2019  1059 Leukocytes,Ua(!): LARGE [CA]  1100 WBC, UA(!): >50 [CA]  1100 Hgb urine dipstick(!): SMALL [CA]  1100 Protein(!): 100 [CA]    Clinical Course User Index [CA] Suzy Bouchard, PA-C    MDM Rules/Calculators/A&P                         59 year old male presents to the ED due to urinary retention associated with suprapubic pain that started around 2AM. History of same. Seen in the ED on 11/21 and 11/12 for same complaint. History of penile implant 10 months ago. Followed by Dr. Amalia Hailey with [facility] urology and has a scheduled transurethral resection with bladder neck incision on 11/30. Denies fever and chills. Denies flank pain. He is currently being treated for UTI with Ciprofloxacin which he has been compliant with.  On arrival, patient is afebrile, not tachycardic or hypoxic.  Mildly elevated BP likely due to pain.  Patient appears extremely uncomfortable on exam.  Patient in no acute distress and nontoxic-appearing.  Physical exam significant for suprapubic tenderness.  No rebound or guarding. Low suspicion for acute abdomen. Penile implant palpated.  No testicular edema or tenderness.  Doubt testicular torsion.  Bladder scan performed at bedside which demonstrated 12mL urine. Low suspicion for urinary retention. Patient denies concern for STDs. Will obtain UA and urine culture to evaluate for infection. Will give oral hydration in hope of urination here in the ED for UA/urine culture. No concern for sepsis at this time. Pain control with Fentanyl. Discussed case with Dr. Ashok Cordia who evaluated patient at bedside and agrees with assessment and plan.   Discussed case with Dr. Amalia Hailey with [facility] urology who recommended pain management and discharge with Ditropan and daily stool softener.   8:56 AM Evaluated patient at bedside who admits to slight improvement  in symptoms. Unable to urinate at this time. Will continue to monitor.  11:18 AM reassessed patient at bedside who admits to worsening pain.  Patient appears very uncomfortable in bed.  Will obtain another bladder scan to evaluate for urinary retention.  Dilaudid given for pain management.  Will give first dose of Ditropan for  Dr. Amalia Hailey with urology recommendations. Will reassess for pain management.  UA significant for proteinuria, hematuria, large leukocytes, and rare bacteria. Negative nitrites. Will continue ciprofloxacin as prescribed for possible UTI. Urine culture pending.   12:11 PM reassessed patient at bedside who admits to improvement in symptoms. RN noted patient to fill a urine cup with urine. Repeat bladder scan showed no urine. Doubt urinary retention. Suspect symptoms related to acute cystitis that he is currently be treated for vs. Radiation cystitis. Will obtain renal study to rule out urethral stone.  CT renal study personally reviewed which is negative for any acute abnormalities. Will treat patient conservative with pain management. Advised patient to save hydrocodone for severe pain and Tylenol for moderate to mild pain. Instructed patient to follow-up with urologist for further evaluation. Patient also discharged with Ditropan per Dr. Amalia Hailey and advised to take Miralax daily.  Strict ED precautions discussed with patient. Patient states understanding and agrees to plan. Patient discharged home in no acute distress and stable vitals   Final Clinical Impression(s) / ED Diagnoses Final diagnoses:  Suprapubic pain    Rx / DC Orders ED Discharge Orders         Ordered    oxybutynin (DITROPAN XL) 10 MG 24 hr tablet  Daily at bedtime        12/29/19 1127           Suzy Bouchard, PA-C 12/29/19 1541    Lajean Saver, MD 01/03/20 [phone]

## 2019-12-29 NOTE — ED Notes (Signed)
Off floor to CT 

## 2019-12-29 NOTE — Discharge Instructions (Addendum)
As discussed, your urine showed a possible UTI. Continue taking your ciprofloxacin as prescribed. Your urine culture is pending and they will call you if your antibiotic needs to be changed. I spoke to Dr. Amalia Hailey, your urologist on the phone who recommended daily Miralax and Ditropan daily which helps relax the bladder. Take as prescribed. I am also sending you home with pain medication. Take as prescribed. Save the hydrocodone for severe pain and take Tylenol for mild to moderate pain. Call Dr. Amalia Hailey office if symptoms do not improve within the next week. Return to the ER for new or worsening symptoms.

## 2019-12-29 NOTE — ED Notes (Signed)
Bladder scanned with 72mL x2

## 2019-12-29 NOTE — ED Notes (Signed)
Bladder scanner was showing 62mL

## 2019-12-29 NOTE — ED Notes (Signed)
Patient verbalizes understanding of discharge instructions. Opportunity for questioning and answers were provided. Armband removed by staff, pt discharged from ED ambulatory to home.  

## 2019-12-29 NOTE — ED Notes (Signed)
Patient placed on bedside commode stating it feels better. Was given a urine cup and advised if he urinates to get some in the cut. Patient verbalized understanding.

## 2019-12-29 NOTE — ED Triage Notes (Addendum)
Pt BIB [facility] for ongoing symptoms of urinary retention for several weeks. Pt reports he has been seen here several times in the last 3 weeks for the same. He is scheduled to have surgery next week at [facility]. Pt groaning and holding his lower abd upon arrival to ED Pt reports "very little output"   BP 164/100 HR 94 RR 20 100% RA

## 2019-12-30 ENCOUNTER — Telehealth: Payer: Self-pay | Admitting: *Deleted

## 2019-12-30 LAB — URINE CULTURE: Culture: NO GROWTH

## 2019-12-30 NOTE — Telephone Encounter (Signed)
Post ED Visit - Positive Culture Follow-up  Culture report reviewed by antimicrobial stewardship pharmacist: [place] Team []  Elenor Quinones, Pharm.D. []  Heide Guile, Pharm.D., BCPS AQ-ID []  Parks Neptune, Pharm.D., BCPS []  Alycia Rossetti, Pharm.D., BCPS []  [place], Pharm.D., BCPS, AAHIVP []  Legrand Como, Pharm.D., BCPS, AAHIVP []  Salome Arnt, PharmD, BCPS []  Johnnette Gourd, PharmD, BCPS []  Hughes Better, PharmD, BCPS []  Leeroy Cha, PharmD []  Laqueta Linden, PharmD, BCPS []  Albertina Parr, PharmD  [place] Team []  Leodis Sias, PharmD []  Lindell Spar, PharmD []  Royetta Asal, PharmD []  Graylin Shiver, Rph []  Rema Fendt) Glennon Mac, PharmD []  Arlyn Dunning, PharmD []  Netta Cedars, PharmD []  Dia Sitter, PharmD []  Leone [place], PharmD []  Gretta Arab, PharmD []  Theodis Shove, PharmD []  Peggyann Juba, PharmD []  Reuel Boom, PharmD   Positive urine culture Likely contaminant, discharged on Ciprofloxacin and no further patient follow-up is required at this time. Mercy Riding, PharmD  Harlon Flor Talley 12/30/2019, 2:55 PM

## 2020-03-27 ENCOUNTER — Telehealth: Payer: Self-pay | Admitting: *Deleted

## 2020-03-27 NOTE — Telephone Encounter (Signed)
   Primary Cardiologist: [name], MD  Chart reviewed as part of pre-operative protocol coverage.   [name] was last seen on 11/01/19 by Dr. Stanford Breed. He has a hx of CAD with NSTEMI 09/2017 s/p PCI LCx and LCx marginal and staged PCI of RCA. Called patient regarding clearance. Left VM requesting call back 03/27/20 at 12PM.  Loel Dubonnet, NP 03/27/2020, 12:06 PM

## 2020-03-27 NOTE — Telephone Encounter (Signed)
    [facility] Pre-operative Risk Assessment    HEARTCARE STAFF: - Please ensure there is not already an duplicate clearance open for this procedure. - Under Visit Info/Reason for Call, type in Other and utilize the format Clearance MM/DD/YY or Clearance TBD. Do not use dashes or single digits. - If request is for dental extraction, please clarify the # of teeth to be extracted.  Request for surgical clearance:  1. What type of surgery is being performed? History of Colon Polyps   2. When is this surgery scheduled? 04/13/2020   3. What type of clearance is required (medical clearance vs. Pharmacy clearance to hold med vs. Both)? Medical  4. Are there any medications that need to be held prior to surgery and how long?None   5. Practice name and name of physician performing surgery? [facility] Dareen Piano   6. What is the office phone number? [phone]   7.   What is the office fax number? [phone]  8.   Anesthesia type (None, local, MAC, general) ? Propofol             Sedation   Barbaraann Barthel 03/27/2020, 10:26 AM  _________________________________________________________________   (provider comments below)

## 2020-03-28 NOTE — Telephone Encounter (Signed)
Called regarding preop clearance 03/28/20 at 1:15PM. Phone went straight to VM. Left VM requesting callback.   Loel Dubonnet, NP

## 2020-04-07 NOTE — Telephone Encounter (Signed)
Called and left VM requesting call back 04/07/20 at 10:30AM. 3rd attempt to reach patient.   Loel Dubonnet, NP

## 2020-04-12 ENCOUNTER — Other Ambulatory Visit: Payer: Self-pay | Admitting: Cardiology

## 2020-04-12 DIAGNOSIS — I10 Essential (primary) hypertension: Secondary | ICD-10-CM

## 2020-04-19 NOTE — Telephone Encounter (Signed)
   Left voicemail for patient to call back for ongoing preop assessment. Appears this is the 4th attempt to contact the patient and the expected procedure date has passed, though unclear if it was completed or not.   Abigail Butts, PA-C

## 2020-04-24 NOTE — Telephone Encounter (Signed)
   I am coming on board covering preop box today. Per notes below, multiple attempts were made to contact patient without success and multiple voicemails left. Phone just goes to voicemail when called. The procedure date has now passed. I will route to our preop callback staff to call GI team to let them know we were unsuccessfully able to reach patient to clear for procedure (and to clarify if procedure was already performed or pushed back). If procedure is still pending, please find out if GI office has an alternative phone number for this patient. If procedure has passed, we can clear out of the box. Dayna Dunn PA-C

## 2020-04-24 NOTE — Telephone Encounter (Signed)
I tried x 2 to reach Dr. Lavetta Nielsen office though no one answered the phone. I did leave a message for scheduler for Dr. Paulita Fujita that our office has been trying to reach the pt for pre op assessment, though unsuccessful in reaching pt. Called GI office in hopes they may have an alternate # on file for the pt.

## 2020-05-02 NOTE — Telephone Encounter (Signed)
Dr. Erlinda Hong office confirmed this procedure was canceled on March 7 and has not been rescheduled. I asked them to reach back out to [place] if he is rescheduled and if they still need preop clearance. She will let Dr. Erlinda Hong nurse know. I will remove from preop box.

## 2020-07-12 ENCOUNTER — Ambulatory Visit (INDEPENDENT_AMBULATORY_CARE_PROVIDER_SITE_OTHER): Payer: Medicare HMO

## 2020-07-12 ENCOUNTER — Other Ambulatory Visit: Payer: Self-pay

## 2020-07-12 ENCOUNTER — Ambulatory Visit (INDEPENDENT_AMBULATORY_CARE_PROVIDER_SITE_OTHER): Payer: Medicare HMO | Admitting: Podiatry

## 2020-07-12 DIAGNOSIS — Q828 Other specified congenital malformations of skin: Secondary | ICD-10-CM | POA: Diagnosis not present

## 2020-07-12 DIAGNOSIS — M216X1 Other acquired deformities of right foot: Secondary | ICD-10-CM

## 2020-07-12 DIAGNOSIS — Z01818 Encounter for other preprocedural examination: Secondary | ICD-10-CM

## 2020-07-13 ENCOUNTER — Encounter: Payer: Self-pay | Admitting: Podiatry

## 2020-07-13 NOTE — Progress Notes (Signed)
Subjective:  Patient ID: [name], male    DOB: 1960-12-21,  MRN: [id number]  Chief Complaint  Patient presents with   Callouses    Right hallux callus     60 y.o. male presents with the above complaint.  Patient presents with primary complaint of right submetatarsal 5 porokeratotic/benign skin lesion with plantarflexed fifth metatarsal.  Patient states is very painful to touch is causing pain discomfort.  He is tried various different conservative treatment options include shoe gear modification and offloading protecting padding none of which has helped.  He is in a lot of pain.  He would like to discuss surgical options at this time.  He denies any other acute complaints.  His pain scale is 10 out of 10 is dull achy in nature hurts with ambulation.   Review of Systems: Negative except as noted in the HPI. Denies N/V/F/Ch.  Past Medical History:  Diagnosis Date   Anxiety    Arthritis    "neck, shoulders, lower back" (09/15/2017)   Carpal tunnel syndrome    Chronic back pain    "neck, lower back" (09/15/2017)   Depression    DVT (deep venous thrombosis) ([place]) ~ 2016   following prostate surgery, treated with course of blood thinners.no blood clot found 2nd [place]   Erectile dysfunction    Fibromyalgia    Former tobacco use    Heart murmur    "born w/one but it closed"   Hematuria    Hyperlipidemia    Hypertension    Positive TB test yrs ago age 85's   Prostate cancer ([place]) 2016   a. s/p prostate surgery.   Sciatica    Spinal stenosis    STEMI (ST elevation myocardial infarction) ([place]) 09/13/2017   PCI/DESx1 to the LAD, with staged intervention DESx1 to the RCA, normal EF   Type II diabetes mellitus ([place])     Current Outpatient Medications:    acetaminophen (TYLENOL 8 HOUR) 650 MG CR tablet, Take 1 tablet (650 mg total) by mouth every 8 (eight) hours as needed for pain., Disp: 30 tablet, Rfl: 0   aspirin EC 81 MG tablet, Take 81 mg by mouth daily., Disp: , Rfl:     atorvastatin (LIPITOR) 80 MG tablet, TAKE 1 TABLET BY MOUTH ONCE DAILY AT  6PM (Patient taking differently: Take 80 mg by mouth every evening. ), Disp: 90 tablet, Rfl: 2   ciprofloxacin (CIPRO) 500 MG tablet, Take 1 tablet (500 mg total) by mouth 2 (two) times daily. (Patient taking differently: Take 500 mg by mouth 2 (two) times daily. Patient wasn't able to verbalize course and how many days he's supposed to take it for.), Disp: 14 tablet, Rfl: 0   docusate sodium (COLACE) 100 MG capsule, Take 100 mg by mouth 2 (two) times daily as needed. Equate brand , Disp: , Rfl:    DULoxetine (CYMBALTA) 30 MG capsule, Take 30 mg by mouth daily., Disp: , Rfl:    gabapentin (NEURONTIN) 300 MG capsule, Take 300 mg by mouth 3 (three) times daily. , Disp: , Rfl:    glipiZIDE (GLUCOTROL XL) 5 MG 24 hr tablet, Take 5 mg by mouth daily., Disp: , Rfl:    HYDROcodone-acetaminophen (NORCO) 5-325 MG tablet, Take 1 tablet by mouth every 4 (four) hours as needed for moderate pain. (Patient not taking: Reported on 11/01/2019), Disp: 6 tablet, Rfl: 0   HYDROcodone-acetaminophen (NORCO/VICODIN) 5-325 MG tablet, Take 1 tablet by mouth every 6 (six) hours as needed., Disp: 8 tablet,  Rfl: 0   ibuprofen (ADVIL) 800 MG tablet, Take 800 mg by mouth daily., Disp: , Rfl:    lisinopril (ZESTRIL) 10 MG tablet, TAKE 1 TABLET EVERY DAY, Disp: 90 tablet, Rfl: 2   metFORMIN (GLUCOPHAGE) 500 MG tablet, Take 1 tablet (500 mg total) by mouth 2 (two) times daily with a meal., Disp: 180 tablet, Rfl: 3   metoprolol tartrate (LOPRESSOR) 25 MG tablet, TAKE 1/2 TABLET TWICE DAILY, Disp: 90 tablet, Rfl: 2   Misc Natural Products (GLUCOSAMINE CHOND CMP ADVANCED PO), Take by mouth daily., Disp: , Rfl:    nitroGLYCERIN (NITROSTAT) 0.4 MG SL tablet, PLACE 1 TABLET (0.4 MG TOTAL) UNDER THE TONGUE EVERY 5 (FIVE) MINUTES FOR 3 DOSES AS NEEDED FOR CHEST PAIN., Disp: 75 tablet, Rfl: 1   oxybutynin (DITROPAN XL) 10 MG 24 hr tablet, Take 1 tablet (10 mg total) by  mouth at bedtime., Disp: 30 tablet, Rfl: 0   sulfamethoxazole-trimethoprim (BACTRIM DS) 800-160 MG tablet, Take 1 tablet by mouth 2 (two) times daily. (Patient not taking: Reported on 12/29/2019), Disp: 14 tablet, Rfl: 0  Social History   Tobacco Use  Smoking Status Former   Packs/day: 0.12   Years: 3.00   Pack years: 0.36   Types: Cigarettes  Smokeless Tobacco Never  Tobacco Comments   09/15/2017 "nothing in the 200s"    Allergies  Allergen Reactions   Cephalexin Hives and Swelling   Belbuca [Buprenorphine Hcl]     dizzy   Objective:  There were no vitals filed for this visit. There is no height or weight on file to calculate BMI. Constitutional Well developed. Well nourished.  Vascular Dorsalis pedis pulses palpable bilaterally. Posterior tibial pulses palpable bilaterally. Capillary refill normal to all digits.  No cyanosis or clubbing noted. Pedal hair growth normal.  Neurologic Normal speech. Oriented to person, place, and time. Epicritic sensation to light touch grossly present bilaterally.  Dermatologic Nails well groomed and normal in appearance. No open wounds. No skin lesions.  Orthopedic: Pain on palpation to the skin lesion on right submetatarsal 5.  Pain on palpation to the porokeratotic lesion.  Mild tailor's bunion noted.  Mild plantarflexed metatarsal noted.  No other bony abnormalities identified   Radiographs: 3 views of skeletally mature adult right foot: Mild tailor's bunion noted with plantarflexed metatarsal.  No other bony abnormalities identified.  No fractures noted.  Osteoarthritic changes with flattening of the metatarsal head noted at the first MPJ joint. Assessment:   1. Plantar flexed metatarsal bone of right foot   2. Porokeratosis   3. Preoperative examination    Plan:  Patient was evaluated and treated and all questions answered.  Right fifth metatarsal mild tailor's bunion with underlying plantarflexed metatarsal noted -I explained  to patient the etiology of his bone and various treatment options were extensively discussed.  Given that this is causing him a lot of discomfort especially with ambulation with porokeratotic lesion submetatarsal 5 I believe patient would benefit from a floating osteotomy to allow the body weight to dictate the appropriate position of the fifth metatarsal head.  This will also help alleviate the pressure on the submetatarsal 5.  I discussed my surgical planning my preoperative planning and my intraoperative planning in extensive detail.  Patient states understand would like to proceed with that.  He can be weightbearing as tolerated with a surgical shoe after the procedure. -Surgical shoe was dispensed Informed surgical risk consent was reviewed and read aloud to the patient.  I reviewed the films.  I have discussed my findings with the patient in great detail.  I have discussed all risks including but not limited to infection, stiffness, scarring, limp, disability, deformity, damage to blood vessels and nerves, numbness, poor healing, need for braces, arthritis, chronic pain, amputation, death.  All benefits and realistic expectations discussed in great detail.  I have made no promises as to the outcome.  I have provided realistic expectations.  I have offered the patient a 2nd opinion, which they have declined and assured me they preferred to proceed despite the risks   No follow-ups on file.

## 2020-08-09 NOTE — Progress Notes (Signed)
[name], [name] ([id number]) Visit Report for 12/28/2018 Problem List Details Patient Name: Date of Service: A RNO LD, DO NA LD 12/28/2018 7:00 A M Medical Record Number: [id number] Patient Account Number: [id number] Date of Birth/Sex: Treating RN: 03-02-1960 (60 y.o. [name] Primary Care Provider: Henri Medal, [place] Other Clinician: Referring Provider: Treating Provider/Extender: Leonides Cave, JA NE Weeks in Treatment: 9 Active Problems ICD-10 Evaluated Encounter Code Description Active Date Today Diagnosis N30.41 Irradiation cystitis with hematuria 10/20/2018 No Yes Z85.46 Personal history of malignant neoplasm of prostate 10/20/2018 No Yes Inactive Problems Resolved Problems Electronic Signature(s) Signed: 12/28/2018 10:15:45 PM By: [name] PA-C Entered By: [name] on 12/28/2018 22:10:25 -------------------------------------------------------------------------------- SuperBill Details Patient Name: Date of Service: A RNO LD, DO NA LD 12/28/2018 Medical Record Number: [id number] Patient Account Number: [id number] Date of Birth/Sex: Treating RN: 08-Feb-1960 (60 y.o. [name] Primary Care Provider: Henri Medal, Greggory Brandy [place] Other Clinician: Referring Provider: Treating Provider/Extender: Leonides Cave, JA [place] Weeks in Treatment: 9 Diagnosis Coding ICD-10 Codes Code Description N30.41 Irradiation cystitis with hematuria Z85.46 Personal history of malignant neoplasm of prostate Facility Procedures CPT4 Code: 62836629 Description: G0277-(Facility Use Only) HBOT full body chamber, 13min , Modifier: Quantity: 4 Physician Procedures : CPT4 Code Description Modifier [id number] - WC PHYS HYPERBARIC OXYGEN THERAPY ICD-10 Diagnosis Description N30.41 Irradiation cystitis with hematuria Quantity: 1 Electronic Signature(s) Signed: 12/28/2018 10:15:45 PM By: [name] PA-C Previous Signature: 12/28/2018 2:18:02 PM Version By: Deon Pilling Entered By: [name] on 12/28/2018 22:10:21

## 2020-08-21 ENCOUNTER — Telehealth: Payer: Self-pay

## 2020-08-21 NOTE — Telephone Encounter (Signed)
DOS 09/11/2020  METATARSAL OSTEOTOMY 5TH RT - 28308  [facility]   Procedure Foot Surgeries: Bunionectomy and Hammertoe Code Description 28308 Osteotomy, with or without lengthening, shortening or angular correction, metatarsal; other than Mrst metatarsal, each  Conformation date 09-11-2020 - 11-07/2020 fuNber oM service dates . [place] authorization number [id number]

## 2020-08-23 ENCOUNTER — Other Ambulatory Visit: Payer: Self-pay | Admitting: Cardiology

## 2020-09-06 ENCOUNTER — Telehealth: Payer: Self-pay

## 2020-09-06 NOTE — Telephone Encounter (Signed)
[name] called and left me a voicemail that he wanted to cancel his surgery with Dr. Posey Pronto on 09/11/2020. I tried calling him back but only got his voicemail, which I left a message. Notified Dr. Posey Pronto and Caren Griffins at [facility] of cancellation.

## 2020-09-20 ENCOUNTER — Encounter: Payer: Medicare HMO | Admitting: Podiatry

## 2020-10-04 ENCOUNTER — Encounter: Payer: Medicare HMO | Admitting: Podiatry

## 2020-10-14 ENCOUNTER — Other Ambulatory Visit: Payer: Self-pay | Admitting: Cardiology

## 2020-10-14 DIAGNOSIS — I10 Essential (primary) hypertension: Secondary | ICD-10-CM

## 2020-10-19 ENCOUNTER — Other Ambulatory Visit: Payer: Self-pay | Admitting: Cardiology

## 2020-11-03 NOTE — Progress Notes (Signed)
HPI: Follow-up coronary artery disease.  Admitted August 2019 with non-ST elevation myocardial infarction.  Cardiac catheterization at that time showed an 85% distal LAD, 80% proximal RCA, 90% mid RCA, occluded circumflex, occluded marginal.  Patient had PCI of his circumflex and circumflex marginal.  He subsequently had staged PCI of his right coronary artery.  Echocardiogram August 2019 showed ejection fraction 62%, grade 2 diastolic dysfunction.  Laboratories September 2022 showed total cholesterol 229, LDL 153, normal liver functions.  Since last seen, patient has an occasional pain in the right chest area that lasts several minutes and resolve spontaneously.  It is described as an ache.  It is not related to exertion.  It is unlike his previous cardiac pain.  He denies exertional chest pain, dyspnea on exertion.  He has some dizziness with bending over and standing back up.  He has not had syncope.  Current Outpatient Medications  Medication Sig Dispense Refill   acetaminophen (TYLENOL 8 HOUR) 650 MG CR tablet Take 1 tablet (650 mg total) by mouth every 8 (eight) hours as needed for pain. 30 tablet 0   aspirin EC 81 MG tablet Take 81 mg by mouth daily.     atorvastatin (LIPITOR) 40 MG tablet Take 40 mg by mouth daily.     citalopram (CELEXA) 40 MG tablet Take 1 tablet by mouth daily.     docusate sodium (COLACE) 100 MG capsule Take 100 mg by mouth 2 (two) times daily as needed. Equate brand      ergocalciferol (VITAMIN D2) 1.25 MG (50000 UT) capsule Take 1 capsule by mouth once a week.     gabapentin (NEURONTIN) 300 MG capsule Take 300 mg by mouth 3 (three) times daily.      glipiZIDE (GLUCOTROL XL) 5 MG 24 hr tablet Take 5 mg by mouth daily.     glipiZIDE (GLUCOTROL XL) 5 MG 24 hr tablet Take 1 tablet by mouth daily.     HYDROcodone-acetaminophen (NORCO) 5-325 MG tablet Take 1 tablet by mouth every 4 (four) hours as needed for moderate pain. 6 tablet 0   ibuprofen (ADVIL) 800 MG tablet  Take 800 mg by mouth daily.     lisinopril (ZESTRIL) 10 MG tablet TAKE 1 TABLET EVERY DAY 90 tablet 2   metFORMIN (GLUCOPHAGE) 500 MG tablet Take 1 tablet (500 mg total) by mouth 2 (two) times daily with a meal. 180 tablet 3   metoprolol tartrate (LOPRESSOR) 25 MG tablet TAKE 1/2 TABLET TWICE DAILY 90 tablet 2   Misc Natural Products (GLUCOSAMINE CHOND CMP ADVANCED PO) Take by mouth daily.     nitroGLYCERIN (NITROSTAT) 0.4 MG SL tablet PLACE 1 TABLET (0.4 MG TOTAL) UNDER THE TONGUE EVERY 5 (FIVE) MINUTES FOR 3 DOSES AS NEEDED FOR CHEST PAIN. 75 tablet 2   No current facility-administered medications for this visit.     Past Medical History:  Diagnosis Date   Anxiety    Arthritis    "neck, shoulders, lower back" (09/15/2017)   Carpal tunnel syndrome    Chronic back pain    "neck, lower back" (09/15/2017)   Depression    DVT (deep venous thrombosis) ([place]) ~ 2016   following prostate surgery, treated with course of blood thinners.no blood clot found 2nd [place]   Erectile dysfunction    Fibromyalgia    Former tobacco use    Heart murmur    "born w/one but it closed"   Hematuria    Hyperlipidemia    Hypertension  Positive TB test yrs ago age 8's   Prostate cancer ([place]) 2016   a. s/p prostate surgery.   Sciatica    Spinal stenosis    STEMI (ST elevation myocardial infarction) ([place]) 09/13/2017   PCI/DESx1 to the LAD, with staged intervention DESx1 to the RCA, normal EF   Type II diabetes mellitus [facility])     Past Surgical History:  Procedure Laterality Date   ANTERIOR CERVICAL DECOMP/DISCECTOMY FUSION  03/2015   APPENDECTOMY  1995   BILATERAL CARPAL TUNNEL RELEASE Bilateral 2017   "30 days apart"   CORONARY ANGIOGRAPHY N/A 09/15/2017   Procedure: CORONARY ANGIOGRAPHY;  Surgeon: Jettie Booze, MD;  Location: [place] [facility];  Service: Cardiovascular;  Laterality: N/A;   CORONARY ANGIOPLASTY WITH STENT PLACEMENT  09/15/2017   CORONARY STENT INTERVENTION N/A 09/15/2017    Procedure: CORONARY STENT INTERVENTION;  Surgeon: Jettie Booze, MD;  Location: [place] [facility];  Service: Cardiovascular;  Laterality: N/A;   CORONARY/GRAFT ACUTE MI REVASCULARIZATION N/A 09/13/2017   Procedure: Coronary/Graft Acute MI Revascularization;  Surgeon: Troy Sine, MD;  Location: [place] [facility];  Service: Cardiovascular;  Laterality: N/A;   CYSTOSCOPY WITH FULGERATION N/A 07/07/2019   Procedure: CYSTOSCOPY WITH FULGERATION;  Surgeon: Lucas Mallow, MD;  Location: [facility];  Service: Urology;  Laterality: N/A;   LEFT HEART CATH AND CORONARY ANGIOGRAPHY N/A 09/13/2017   Procedure: LEFT HEART CATH AND CORONARY ANGIOGRAPHY;  Surgeon: Troy Sine, MD;  Location: [place] [facility];  Service: Cardiovascular;  Laterality: N/A;   OTHER SURGICAL HISTORY  2003   "genital warts removed"   PENILE PROSTHESIS IMPLANT N/A 02/22/2019   Procedure: PENILE PROTHESIS INFLATABLE COLOPLAST;  Surgeon: Lucas Mallow, MD;  Location: [place];  Service: Urology;  Laterality: N/A;   PROSTATECTOMY  2016    Social History   Socioeconomic History   Marital status: Married    Spouse name: Not on file   Number of children: 8   Years of education: Not on file   Highest education level: Not on file  Occupational History   Occupation: truck driver  Tobacco Use   Smoking status: Former    Packs/day: 0.12    Years: 3.00    Pack years: 0.36    Types: Cigarettes   Smokeless tobacco: Never   Tobacco comments:    09/15/2017 "nothing in the 200s"  Vaping Use   Vaping Use: Never used  Substance and Sexual Activity   Alcohol use: Not Currently   Drug use: Yes    Types: Marijuana    Comment: marijuana used daily   Sexual activity: Not Currently  Other Topics Concern   Not on file  Social History Narrative   He lives with wife and daughter.   He is currently not working since October 2014.  He was working as a Administrator, previously was  doing long distance, but now only locally.    Social Determinants of Health   Financial Resource Strain: Not on file  Food Insecurity: Not on file  Transportation Needs: Not on file  Physical Activity: Not on file  Stress: Not on file  Social Connections: Not on file  Intimate Partner Violence: Not on file    Family History  Problem Relation Age of Onset   Diabetes Mother    Arthritis Mother    Heart disease Mother        stents   Diabetes Sister  Cancer Sister    Arthritis Maternal Grandmother     ROS: no fevers or chills, productive cough, hemoptysis, dysphasia, odynophagia, melena, hematochezia, rash, seizure activity, orthopnea, PND, pedal edema, claudication. Remaining systems are negative.  Physical Exam: Well-developed well-nourished in no acute distress.  Skin is warm and dry.  HEENT is normal.  Neck is supple.  Chest is clear to auscultation with normal expansion.  Cardiovascular exam is regular rate and rhythm.  Abdominal exam nontender or distended. No masses palpated. Extremities show no edema. neuro grossly intact  ECG-sinus bradycardia with PACs, no significant ST changes.  Personally reviewed  A/P  1 coronary artery disease-Plan to continue medical therapy with aspirin and statin.  Occasional vague chest pain that does not sound cardiac.  Electrocardiogram shows no ST changes.  Will not pursue further ischemia evaluation at this point.  2 hypertension-patient's blood pressure is elevated; he has occasional dizziness when changing from bending over to standing.  I have asked him to stay hydrated.  I would like for his blood pressure to be better controlled and I will increase lisinopril to 20 mg daily.  We will follow and adjust regimen as needed.  3 hyperlipidemia-recent LDL 153.  However at that time he was not taking Lipitor.  This was resumed at 40 mg daily as he was not tolerating 80 mg.  We will check lipids and liver in approximately 6 weeks.  If  LDL not at goal we will add Zetia.  [name], MD

## 2020-11-07 ENCOUNTER — Ambulatory Visit: Payer: Medicare HMO | Admitting: Cardiology

## 2020-11-07 ENCOUNTER — Encounter: Payer: Self-pay | Admitting: Cardiology

## 2020-11-07 ENCOUNTER — Other Ambulatory Visit: Payer: Self-pay

## 2020-11-07 VITALS — BP 156/85 | HR 48 | Resp 20 | Ht 70.0 in | Wt 233.2 lb

## 2020-11-07 DIAGNOSIS — I2510 Atherosclerotic heart disease of native coronary artery without angina pectoris: Secondary | ICD-10-CM

## 2020-11-07 DIAGNOSIS — R072 Precordial pain: Secondary | ICD-10-CM

## 2020-11-07 DIAGNOSIS — Z9861 Coronary angioplasty status: Secondary | ICD-10-CM

## 2020-11-07 DIAGNOSIS — I10 Essential (primary) hypertension: Secondary | ICD-10-CM | POA: Diagnosis not present

## 2020-11-07 DIAGNOSIS — E785 Hyperlipidemia, unspecified: Secondary | ICD-10-CM | POA: Diagnosis not present

## 2020-11-07 MED ORDER — LISINOPRIL 20 MG PO TABS
20.0000 mg | ORAL_TABLET | Freq: Every day | ORAL | 3 refills | Status: DC
Start: 2020-11-07 — End: 2020-11-07

## 2020-11-07 MED ORDER — LISINOPRIL 20 MG PO TABS: 20.0000 mg | ORAL_TABLET | Freq: Every day | ORAL | 3 refills | Status: DC

## 2020-11-07 NOTE — Patient Instructions (Signed)
Medication Instructions:   INCREASE LISINOPRIL TO 20 MG ONCE DAILY= 2 OF THE 10 MG TABLETS ONCE DAILY  *If you need a refill on your cardiac medications before your next appointment, please call your pharmacy*   Lab Work:  Your physician recommends that you return for lab work in: 6-8 WEEKS=FASTING  If you have labs (blood work) drawn today and your tests are completely normal, you will receive your results only by: [place] (if you have MyChart) OR A paper copy in the mail If you have any lab test that is abnormal or we need to change your treatment, we will call you to review the results   Follow-Up: At [facility], you and your health needs are our priority.  As part of our continuing mission to provide you with exceptional heart care, we have created designated Provider Care Teams.  These Care Teams include your primary Cardiologist (physician) and Advanced Practice Providers (APPs -  Physician Assistants and Nurse Practitioners) who all work together to provide you with the care you need, when you need it.  We recommend signing up for the patient portal called "MyChart".  Sign up information is provided on this After Visit Summary.  MyChart is used to connect with patients for Virtual Visits (Telemedicine).  Patients are able to view lab/test results, encounter notes, upcoming appointments, etc.  Non-urgent messages can be sent to your provider as well.   To learn more about what you can do with MyChart, go to [email or web address].    Your next appointment:   12 month(s)  The format for your next appointment:   In Person  Provider:   [name], MD

## 2020-11-07 NOTE — Addendum Note (Signed)
Addended by: Cristopher Estimable on: 11/07/2020 08:15 AM   Modules accepted: Orders

## 2021-01-14 IMAGING — DX DG CHEST 2V
2 series · 2 of 2 positions shown · non-contrast
Comparison: 09/13/2017

CLINICAL DATA: Hypertension

EXAM:
CHEST - 2 VIEW

[chest pa]
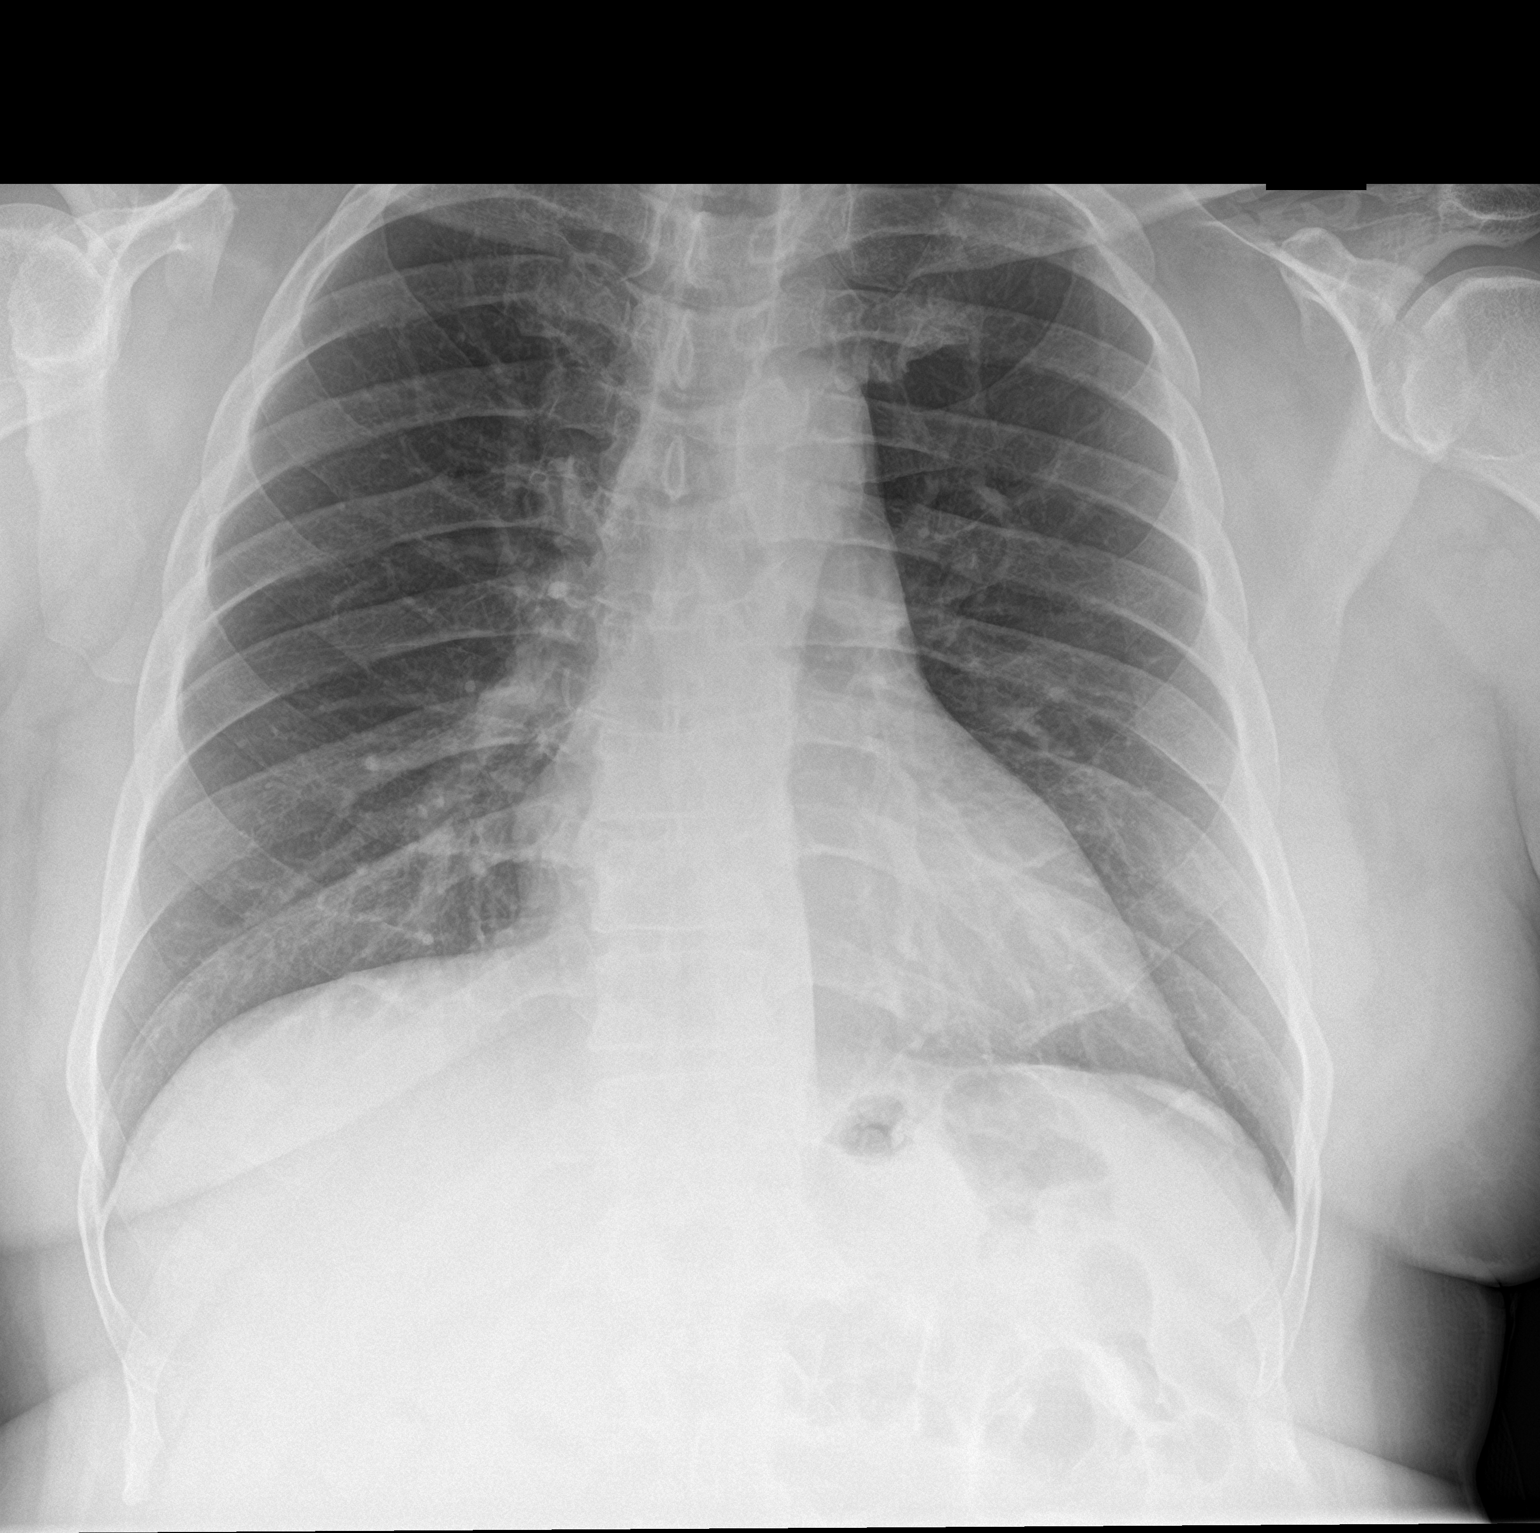

[chest lat]
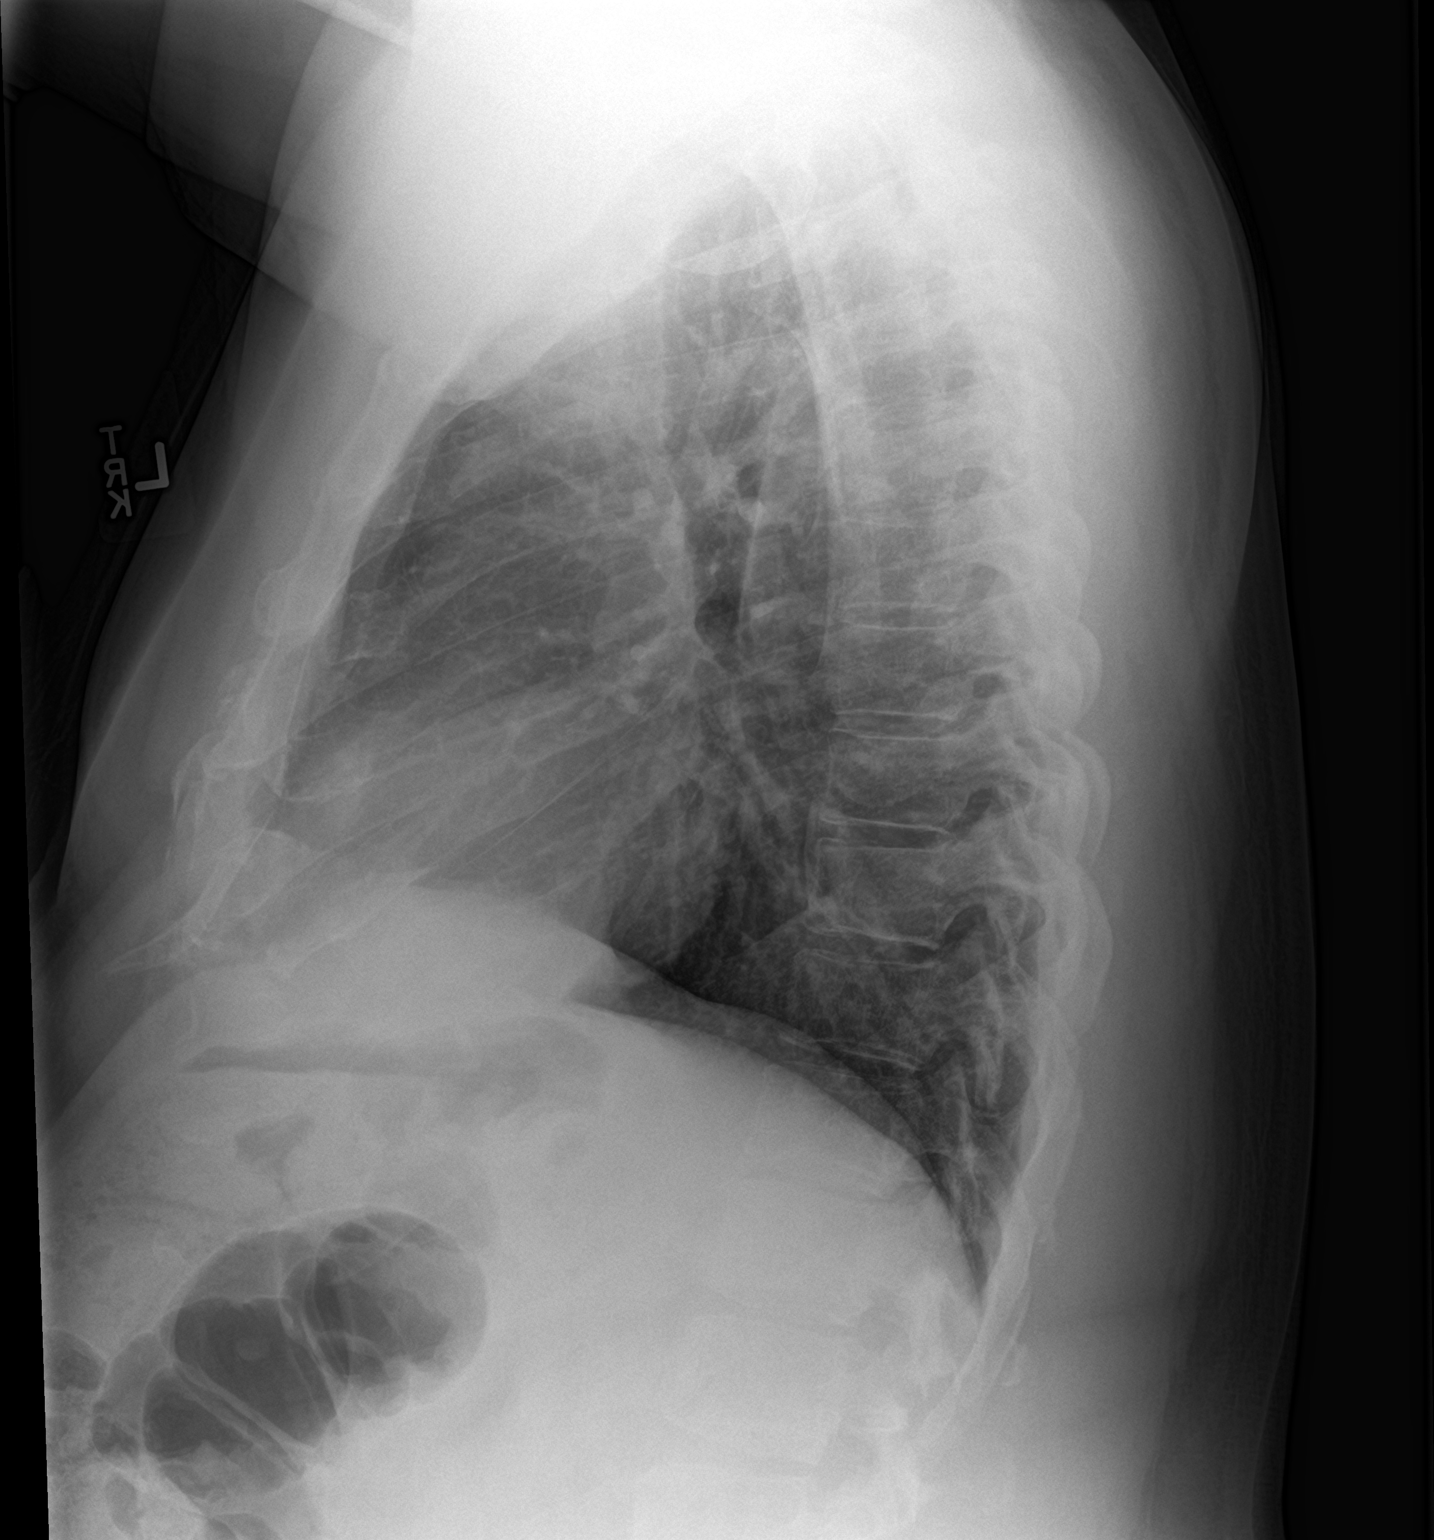

[2 of 2 positions shown; findings below may reference images not displayed]

FINDINGS: The heart size and mediastinal contours are within normal limits.
Both lungs are clear. The visualized skeletal structures are
unremarkable.
IMPRESSION: No acute abnormality of the lungs.

## 2021-02-06 ENCOUNTER — Telehealth: Payer: Self-pay | Admitting: Cardiology

## 2021-02-06 NOTE — Telephone Encounter (Signed)
° °  Pre-operative Risk Assessment    Patient Name: [name]  DOB: 05-Dec-1960 MRN: [id number]      Request for Surgical Clearance    Procedure:  Bladder Biopsy   Date of Surgery:  Clearance 02/13/21                                 Surgeon:  Dr. Lenoria Chime Surgeon's Group or Practice Name:  [facility] Phone number:  [phone] Fax number:  [phone]   Type of Clearance Requested:   - Pharmacy:  Hold Aspirin 3 days    Type of Anesthesia:  General    Additional requests/questions:    [name]   02/06/2021, 12:01 PM

## 2021-02-07 NOTE — Telephone Encounter (Signed)
Left a message for the patient to call back and speak to the preop APP of today

## 2021-02-15 NOTE — Telephone Encounter (Signed)
Please confirms with requesting office to see if clearance is still needed. The patient never called [place] back and it appears he "no showed" for preop admission as well. The date of surgery has already passed, if still needed, what is the new surgery date?

## 2021-02-15 NOTE — Telephone Encounter (Signed)
I left a message for Dr. Earle Gell surgery scheduler to please call the office [phone] and ask to s/w a member of the pre op team. See notes from Almyra Deforest, [facility] pre op provider today. I will also fax these notes to requesting office as FYI to please call our office back to discuss further.

## 2021-02-15 NOTE — Telephone Encounter (Signed)
Spoke with the surgery scheduler of urology service.  They says they did not request the cardiac clearance on this patient nor do they need one.  This is a very low risk procedure.  Therefore, I will remove this cardiac clearance from our pool.

## 2021-03-14 ENCOUNTER — Telehealth: Payer: Self-pay | Admitting: *Deleted

## 2021-03-14 NOTE — Telephone Encounter (Signed)
Left message for pt to call, recent labs from [facility] reviewed by dr Stanford Breed and the LDL of 84 is not at goal for that number. Dr Stanford Breed would like the patient to start ezetimibe 10 mg once daily and repeat lab work in 8 weeks.

## 2021-03-15 ENCOUNTER — Telehealth: Payer: Self-pay | Admitting: Cardiology

## 2021-03-15 DIAGNOSIS — E785 Hyperlipidemia, unspecified: Secondary | ICD-10-CM

## 2021-03-15 MED ORDER — EZETIMIBE 10 MG PO TABS
10.0000 mg | ORAL_TABLET | Freq: Every day | ORAL | 3 refills | Status: DC
Start: 2021-03-15 — End: 2022-03-24

## 2021-03-15 NOTE — Telephone Encounter (Signed)
[name] Estimable, RN      3:07 PM Note Left message for pt to call, recent labs from [facility] reviewed by dr Stanford Breed and the LDL of 84 is not at goal for that number. Dr Stanford Breed would like the patient to start ezetimibe 10 mg once daily and repeat lab work in 8 weeks.     Above copied from 03/14/21 note

## 2021-03-15 NOTE — Telephone Encounter (Signed)
Patient called w/results Rx(s) sent to pharmacy electronically. Lab order mailed

## 2021-03-15 NOTE — Telephone Encounter (Signed)
Patient was returning call for results 

## 2021-03-15 NOTE — Telephone Encounter (Signed)
See telephone note from 2/9

## 2021-04-02 ENCOUNTER — Telehealth: Payer: Self-pay

## 2021-04-02 NOTE — Telephone Encounter (Signed)
° °  Pre-operative Risk Assessment    Patient Name: [name]  DOB: 27-Aug-1960 MRN: [id number]      Request for Surgical Clearance    Procedure:   Artifical Urinary Sphincter Placement   Date of Surgery:  Clearance 05/15/21                                 Surgeon:  Dr. Odis Luster Surgeon's Group or Practice Name:  [facility] Phone number:  [phone] Fax number:  [phone]   Type of Clearance Requested:   - Medical  - Pharmacy:  Hold Aspirin 10 days prior to procedure    Type of Anesthesia:  Not Indicated   Additional requests/questions:    Tana Conch   04/02/2021, 3:32 PM

## 2021-04-04 NOTE — Telephone Encounter (Signed)
? ?  Primary Cardiologist: [name], MD ? ?Chart reviewed as part of pre-operative protocol coverage. Given past medical history and time since last visit, based on ACC/AHA guidelines, TEONDRE JAROSZ would be at acceptable risk for the planned procedure without further cardiovascular testing.  ? ?Mr. Schroeter has not had any cardiovascular issues since his last office appointment 11/07/2020.  Per our conversation today he is able to meet the required 4 METS.  He is able to walk 1-2 blocks and go up and down stairs. ? ?Per Dr. Stanford Breed okay to hold aspirin x10 days prior to his procedure and resume soon as the surgeon deems safe to do so. ? ?Patient was advised that if he develops new symptoms prior to surgery to contact our office to arrange a follow-up appointment.  He verbalized understanding. ? ?I will route this recommendation to the requesting party via Epic fax function and remove from pre-op pool. ? ?Please call with questions. ?Elgie Collard, PA-C ? ?04/04/2021, 8:05 AM ? ? ? ?

## 2021-05-23 LAB — LIPID PANEL
Chol/HDL Ratio: 2.4 ratio (ref 0.0–5.0)
Cholesterol, Total: 116 mg/dL (ref 100–199)
HDL: 48 mg/dL (ref >39)
LDL Chol Calc (NIH): 55 mg/dL (ref 0–99)
Triglycerides: 61 mg/dL (ref 0–149)
VLDL Cholesterol Cal: 13 mg/dL (ref 5–40)

## 2022-03-24 ENCOUNTER — Other Ambulatory Visit: Payer: Self-pay | Admitting: Cardiology

## 2022-03-25 MED ORDER — EZETIMIBE 10 MG PO TABS: 10.0000 mg | ORAL_TABLET | Freq: Every day | ORAL | 3 refills | Status: DC

## 2022-06-12 NOTE — Progress Notes (Signed)
[id number] coronary artery disease.  Admitted August 2019 with non-ST elevation myocardial infarction.  Cardiac catheterization at that time showed an 85% distal LAD, 80% proximal RCA, 90% mid RCA, occluded circumflex, occluded marginal.  Patient had PCI of his circumflex and circumflex marginal.  He subsequently had staged PCI of his right coronary artery.  Echocardiogram August 2019 showed ejection fraction 50%, grade 2 diastolic dysfunction. Since last seen, the patient denies any dyspnea on exertion, orthopnea, PND, pedal edema, palpitations, syncope or chest pain.   Current Outpatient Medications  Medication Sig Dispense Refill   acetaminophen (TYLENOL 8 HOUR) 650 MG CR tablet Take 1 tablet (650 mg total) by mouth every 8 (eight) hours as needed for pain. 30 tablet 0   aspirin EC 81 MG tablet Take 81 mg by mouth daily.     atorvastatin (LIPITOR) 40 MG tablet Take 40 mg by mouth daily.     ergocalciferol (VITAMIN D2) 1.25 MG (50000 UT) capsule Take 1 capsule by mouth once a week.     ezetimibe (ZETIA) 10 MG tablet Take 1 tablet (10 mg total) by mouth daily. 90 tablet 3   gabapentin (NEURONTIN) 300 MG capsule Take 300 mg by mouth 3 (three) times daily.      glipiZIDE (GLUCOTROL XL) 5 MG 24 hr tablet Take 1 tablet by mouth daily.     lisinopril (ZESTRIL) 20 MG tablet Take 1 tablet (20 mg total) by mouth daily. 90 tablet 3   metFORMIN (GLUCOPHAGE) 500 MG tablet Take 1 tablet (500 mg total) by mouth 2 (two) times daily with a meal. 180 tablet 3   metoprolol tartrate (LOPRESSOR) 25 MG tablet TAKE 1/2 TABLET TWICE DAILY 90 tablet 2   Misc Natural Products (GLUCOSAMINE CHOND CMP ADVANCED PO) Take by mouth daily.     nitroGLYCERIN (NITROSTAT) 0.4 MG SL tablet PLACE 1 TABLET (0.4 MG TOTAL) UNDER THE TONGUE EVERY 5 (FIVE) MINUTES FOR 3 DOSES AS NEEDED FOR CHEST PAIN. 75 tablet 2   No current facility-administered medications for this visit.     Past Medical History:  Diagnosis Date    Anxiety    Arthritis    "neck, shoulders, lower back" (09/15/2017)   Carpal tunnel syndrome    Chronic back pain    "neck, lower back" (09/15/2017)   Depression    DVT (deep venous thrombosis) (HCC) ~ 2016   following prostate surgery, treated with course of blood thinners.no blood clot found 2nd [place]   Erectile dysfunction    Fibromyalgia    Former tobacco use    Heart murmur    "born w/one but it closed"   Hematuria    Hyperlipidemia    Hypertension    Positive TB test yrs ago age 70's   Prostate cancer (HCC) 2016   a. s/p prostate surgery.   Sciatica    Spinal stenosis    STEMI (ST elevation myocardial infarction) (HCC) 09/13/2017   PCI/DESx1 to the LAD, with staged intervention DESx1 to the RCA, normal EF   Type II diabetes mellitus [facility])     Past Surgical History:  Procedure Laterality Date   ANTERIOR CERVICAL DECOMP/DISCECTOMY FUSION  03/2015   APPENDECTOMY  1995   BILATERAL CARPAL TUNNEL RELEASE Bilateral 2017   "30 days apart"   CORONARY ANGIOGRAPHY N/A 09/15/2017   Procedure: CORONARY ANGIOGRAPHY;  Surgeon: Corky Crafts, MD;  Location[facility] INVASIVE CV LAB;  Service: Cardiovascular;  Laterality: N/A;   CORONARY ANGIOPLASTY WITH STENT PLACEMENT  09/15/2017  CORONARY STENT INTERVENTION N/A 09/15/2017   Procedure: CORONARY STENT INTERVENTION;  Surgeon: Corky Crafts, MD;  Location: [facility] INVASIVE CV LAB;  Service: Cardiovascular;  Laterality: N/A;   CORONARY/GRAFT ACUTE MI REVASCULARIZATION N/A 09/13/2017   Procedure: Coronary/Graft Acute MI Revascularization;  Surgeon: Lennette Bihari, MD;  Location: [facility] INVASIVE CV LAB;  Service: Cardiovascular;  Laterality: N/A;   CYSTOSCOPY WITH FULGERATION N/A 07/07/2019   Procedure: CYSTOSCOPY WITH FULGERATION;  Surgeon: Crista Elliot, MD;  Location: [facility];  Service: Urology;  Laterality: N/A;   LEFT HEART CATH AND CORONARY ANGIOGRAPHY N/A 09/13/2017   Procedure: LEFT HEART CATH AND CORONARY  ANGIOGRAPHY;  Surgeon: Lennette Bihari, MD;  Location: [facility] INVASIVE CV LAB;  Service: Cardiovascular;  Laterality: N/A;   OTHER SURGICAL HISTORY  2003   "genital warts removed"   PENILE PROSTHESIS IMPLANT N/A 02/22/2019   Procedure: PENILE PROTHESIS INFLATABLE COLOPLAST;  Surgeon: Crista Elliot, MD;  Location: [facility] ;  Service: Urology;  Laterality: N/A;   PROSTATECTOMY  2016    Social History   Socioeconomic History   Marital status: Married    Spouse name: Not on file   Number of children: 8   Years of education: Not on file   Highest education level: Not on file  Occupational History   Occupation: truck driver  Tobacco Use   Smoking status: Former    Packs/day: 0.12    Years: 3.00    Additional pack years: 0.00    Total pack years: 0.36    Types: Cigarettes   Smokeless tobacco: Never   Tobacco comments:    09/15/2017 "nothing in the 200s"  Vaping Use   Vaping Use: Never used  Substance and Sexual Activity   Alcohol use: Not Currently   Drug use: Yes    Types: Marijuana    Comment: marijuana used daily   Sexual activity: Not Currently  Other Topics Concern   Not on file  Social History Narrative   He lives with wife and daughter.   He is currently not working since October 2014.  He was working as a Naval architect, previously was doing long distance, but now only locally.    Social Determinants of Health   Financial Resource Strain: Not on file  Food Insecurity: Not on file  Transportation Needs: Not on file  Physical Activity: Not on file  Stress: Not on file  Social Connections: Not on file  Intimate Partner Violence: Not on file    Family History  Problem Relation Age of Onset   Diabetes Mother    Arthritis Mother    Heart disease Mother        stents   Diabetes Sister    Cancer Sister    Arthritis Maternal Grandmother     ROS: no fevers or chills, productive cough, hemoptysis, dysphasia, odynophagia, melena, hematochezia,  dysuria, hematuria, rash, seizure activity, orthopnea, PND, pedal edema, claudication. Remaining systems are negative.  Physical Exam: Well-developed well-nourished in no acute distress.  Skin is warm and dry.  HEENT is normal.  Neck is supple.  Chest is clear to auscultation with normal expansion.  Cardiovascular exam is regular rate and rhythm.  Abdominal exam nontender or distended. No masses palpated. Extremities show no edema. neuro grossly intact  ECG-sinus bradycardia at a rate of 50, nonspecific ST changes.  Personally reviewed  A/P  1 coronary artery disease-patient denies chest pain.  Continue aspirin and statin.  2 hypertension-patient's blood pressure  is elevated; increase lisinopril to 40 mg daily.  In 1 week check potassium and renal function.  Follow blood pressure and advance medications as needed.  3 hyperlipidemia-continue lipitor and zetia; most recent LDL 55.  Olga Millers, MD

## 2022-06-21 ENCOUNTER — Ambulatory Visit: Payer: 59 | Attending: Cardiology | Admitting: Cardiology

## 2022-06-21 ENCOUNTER — Encounter: Payer: Self-pay | Admitting: Cardiology

## 2022-06-21 VITALS — BP 150/82 | HR 50 | Ht 70.0 in | Wt 245.0 lb

## 2022-06-21 DIAGNOSIS — E785 Hyperlipidemia, unspecified: Secondary | ICD-10-CM

## 2022-06-21 DIAGNOSIS — I10 Essential (primary) hypertension: Secondary | ICD-10-CM

## 2022-06-21 DIAGNOSIS — I2510 Atherosclerotic heart disease of native coronary artery without angina pectoris: Secondary | ICD-10-CM | POA: Diagnosis not present

## 2022-06-21 DIAGNOSIS — Z9861 Coronary angioplasty status: Secondary | ICD-10-CM | POA: Diagnosis not present

## 2022-06-21 MED ORDER — LISINOPRIL 40 MG PO TABS: 40.0000 mg | ORAL_TABLET | Freq: Every day | ORAL | 3 refills | Status: DC

## 2022-06-21 NOTE — Patient Instructions (Signed)
Medication Instructions:  Increase Lisinopril to 40 mg daily Continue all other medications *If you need a refill on your cardiac medications before your next appointment, please call your pharmacy*   Lab Work: Bmet in 1 week    Testing/Procedures: None ordered   Follow-Up: At [facility], you and your health needs are our priority.  As part of our continuing mission to provide you with exceptional heart care, we have created designated Provider Care Teams.  These Care Teams include your primary Cardiologist (physician) and Advanced Practice Providers (APPs -  Physician Assistants and Nurse Practitioners) who all work together to provide you with the care you need, when you need it.  We recommend signing up for the patient portal called "MyChart".  Sign up information is provided on this After Visit Summary.  MyChart is used to connect with patients for Virtual Visits (Telemedicine).  Patients are able to view lab/test results, encounter notes, upcoming appointments, etc.  Non-urgent messages can be sent to your provider as well.   To learn more about what you can do with MyChart, go to [email or web address].    Your next appointment:  1 year   Call in Feb to schedule May appointment    Provider:  Winfield Rast

## 2022-06-29 LAB — BASIC METABOLIC PANEL WITH GFR
BUN/Creatinine Ratio: 17 (ref 10–24)
BUN: 21 mg/dL (ref 8–27)
CO2: 22 mmol/L (ref 20–29)
Calcium: 9.1 mg/dL (ref 8.6–10.2)
Chloride: 105 mmol/L (ref 96–106)
Creatinine, Ser: 1.23 mg/dL (ref 0.76–1.27)
Glucose: 139 mg/dL — ABNORMAL HIGH (ref 70–99)
Potassium: 4.8 mmol/L (ref 3.5–5.2)
Sodium: 140 mmol/L (ref 134–144)
eGFR: 67 mL/min/1.73

## 2022-07-02 ENCOUNTER — Encounter: Payer: Self-pay | Admitting: *Deleted

## 2022-07-22 ENCOUNTER — Encounter: Payer: 59 | Admitting: *Deleted

## 2022-07-22 DIAGNOSIS — Z006 Encounter for examination for normal comparison and control in clinical research program: Secondary | ICD-10-CM

## 2022-07-22 NOTE — Research (Addendum)
AA HEART  Informed Consent   Subject Name: [name]  Subject met inclusion and exclusion criteria.  The informed consent form, study requirements and expectations were reviewed with the subject and questions and concerns were addressed prior to the signing of the consent form.  The subject verbalized understanding of the trial requirements.  The subject agreed to participate in the AA HEART trial and signed the informed consent at 10:15 on 07-22-2022.  The informed consent was obtained prior to performance of any protocol-specific procedures for the subject.  A copy of the signed informed consent was given to the subject and a copy was placed in the subject's medical record.   [place] Lilymarie Scroggins, Research Coordinator    Are there any labs that are clinically significant?  Yes []  OR No[]    ACCESSION NO. [id number]                                             Page 1 of 1                                                        INVESTIGATOR: ([id number])                          PROTOCOL   [id number]                     Thomasene Ripple, M.D.                              INVESTIGATOR NO.: [id number]                     c/o [facility]                         SUBJECT NUMBER: [id number]                     [place]. [facility]                 SUBJECT INITIALS NOT COLLECTED:                     [id number]                          VISIT: D0                     [place], [place] [address]                   SPONSOR REPORT TO:                 COLLECTION TIME:11:00 DATE:22-Jul-2022                     Cruzita Lederer                 DATE RECEIVED IN LABORATORY: 23-Jul-2022  c/o Sponsor [employer] access         DATE REPORTED BY LABORATORY: 23-Jul-2022                     [place]                          SEX: M  AGE: [id number]                     [id number]                   [place], [place] [phone]                                                                                         Ref. Ranges               Clinical    Comments                                                                          Significance                                                                            Yes*  No                    HEMATOLOGY&DIFFERENTIAL PANEL                      HGB            10.5    L    12.5-17.0 g/dL                      HCT            35      L    37-51 %                      RBC            3.9     L    4.0-5.8 x106/uL                      MCH            27           26-34 pg                      MCHC  30      L    31-38 g/dL                      RDW            14.2         12.0-15.0 %                      RBC Morph      No Review Required                        MCV            90           80-100 fL                      WBC            3.57    L    3.80-10.70 x103/uL                      Neutrophil     1.90    L    1.96-7.23 x103/uL                      Lymphocyte     1.29         0.80-3.00 x103/uL                      Monocytes      0.21         0.12-0.92 x103/uL                      Eosinophil     0.15         0.00-0.57 x103/uL                      Basophils      0.02         0.00-0.20 x103/uL                      Neutrophil     53.2         40.5-75.0 %                      Lymphocyte     36.2         15.4-48.5%                      Monocytes      5.8          2.6-10.1 %                      Eosinophil     4.2          0.0-6.8 %                      Basophils      0.6          0.0-2.0 %                      Platelets      297          130-394 x103/uL  RETICULOCYTES                      Retic %        1.6          0.6-2.5 %                      Retic Abs      0.060        0.030-0.130 x106/uL   [id number] LPA COLLECTION D/T                      Untimed D      22-Jul-2022                        Untimed T      11:00                            SM/PLASMA PROTEO & BMS D/T                      Untimed  D      22-Jul-2022                        Untimed T      11:00                            SM/WB DNA COLLECTION D/T                      Untimed D      22-Jul-2022                        Untimed T      11:00                            SM/WB PAXGENE RNA COLLECT D/T                      Untimed D      22-Jul-2022                        Untimed T      11:00                            SUBJECT FASTED AT LEAST 9 HRS?                      Pt fast 9h     No                                 H(High) or L(Low) =Values above or below [facility] CLS reference range                          [id number]           T=Telephone P="Panic"                                                                                                                                  (  INV)                     H(High) or L(Low) =Values above or below [facility] CLS reference range                          [id number]           T=Telephone P="Panic"                                                                                                                                  (INV

## 2022-09-13 ENCOUNTER — Other Ambulatory Visit: Payer: Self-pay

## 2022-09-13 MED ORDER — LISINOPRIL 40 MG PO TABS: 40.0000 mg | ORAL_TABLET | Freq: Every day | ORAL | 2 refills | Status: DC

## 2022-09-20 ENCOUNTER — Ambulatory Visit (INDEPENDENT_AMBULATORY_CARE_PROVIDER_SITE_OTHER): Payer: 59 | Admitting: Podiatry

## 2022-09-20 DIAGNOSIS — M2141 Flat foot [pes planus] (acquired), right foot: Secondary | ICD-10-CM | POA: Diagnosis not present

## 2022-09-20 DIAGNOSIS — M2142 Flat foot [pes planus] (acquired), left foot: Secondary | ICD-10-CM

## 2022-09-20 DIAGNOSIS — M19071 Primary osteoarthritis, right ankle and foot: Secondary | ICD-10-CM

## 2022-09-20 DIAGNOSIS — E119 Type 2 diabetes mellitus without complications: Secondary | ICD-10-CM

## 2022-09-20 DIAGNOSIS — M19072 Primary osteoarthritis, left ankle and foot: Secondary | ICD-10-CM

## 2022-09-20 NOTE — Progress Notes (Signed)
  Subjective:  Patient ID: [name], male    DOB: 1960-07-01,  MRN: [id number]  Chief Complaint  Patient presents with   Diabetes    DIABETIC EXAM INTERESTED IN SHOES A1C - 5.5    62 y.o. male presents with the above complaint. History confirmed with patient. Patient presenting for routine DM exam.  Patient does have have a history of T2DM well-controlled on oral medication.  Patient denies any neuropathy.  Objective:  Physical Exam: warm, good capillary refill nail exam normal nails without lesions DP pulses palpable, PT pulses palpable, and protective sensation intact Left Foot:  Pain with palpation of nails due to elongation and dystrophic growth.  Right Foot: Pain with palpation of nails due to elongation and dystrophic growth.   Assessment:  No diagnosis found.   Plan:  Patient was evaluated and treated and all questions answered.  #  No follow-ups on file.         Corinna Gab, DPM [facility] / [facility]

## 2022-09-20 NOTE — Progress Notes (Signed)
  Subjective:  Patient ID: [name], male    DOB: 1960-04-04,  MRN: [id number]  Chief Complaint  Patient presents with   Diabetes    DIABETIC EXAM INTERESTED IN SHOES A1C - 5.5    62 y.o. male presents with the above complaint. History confirmed with patient. Patient presenting for routine DM exam.  Patient does have have a history of T2DM well-controlled on oral medication.  Patient denies any neuropathy.  Objective:  Physical Exam: warm, good capillary refill nail exam normal nails without lesions DP pulses palpable, PT pulses palpable, and protective sensation intact Left Foot:  Pain with palpation of nails due to elongation and dystrophic growth.  Right Foot: Pain with palpation of nails due to elongation and dystrophic growth.   Assessment:   1. Comprehensive diabetic foot examination, type 2 DM, encounter for (HCC)   2. Pes planus of both feet   3. Arthritis of both midfeet      Plan:  Patient was evaluated and treated and all questions answered.  # DM2 with no significant complication, pes planus and arthritis -Patient educated on diabetes. Discussed proper diabetic foot care and discussed risks and complications of disease. Educated patient in depth on reasons to return to the office immediately should he/she discover anything concerning or new on the feet. All questions answered. Discussed proper shoes as well.  -Discussed patient would benefit from custom-made diabetic shoes and liners to prevent pressure lesions from forming given concern for midfoot arthritis and pes planus deformity bilateral foot  Return if symptoms worsen or fail to improve.         Corinna Gab, DPM [facility] / [facility]

## 2022-09-26 ENCOUNTER — Other Ambulatory Visit: Payer: 59

## 2022-09-26 NOTE — Progress Notes (Signed)
Patient presents to the office today for diabetic shoe and insole measuring.  Patient was measured with brannock device to determine size and width for 1 pair of extra depth shoes and foam casted for 3 pair of insoles.   Documentation of medical necessity will be sent to patient's treating diabetic doctor to verify and sign.   Patient's diabetic provider: Marcheta Grammes and insoles will be ordered at that time and patient will be notified for an appointment for fitting when they arrive.  Shoe size (per patient): 12 Brannock measurement: 11.5 Patient shoe selection- Shoe choice:   [id number] New Bal  2nd choice [employer] [phone] Shoe size ordered: 12W  ABN and Financial obtained  Addison Bailey Cped CFo, CFm

## 2022-10-30 ENCOUNTER — Ambulatory Visit (INDEPENDENT_AMBULATORY_CARE_PROVIDER_SITE_OTHER): Payer: 59

## 2022-10-30 DIAGNOSIS — M19072 Primary osteoarthritis, left ankle and foot: Secondary | ICD-10-CM | POA: Diagnosis not present

## 2022-10-30 DIAGNOSIS — M2142 Flat foot [pes planus] (acquired), left foot: Secondary | ICD-10-CM

## 2022-10-30 DIAGNOSIS — E119 Type 2 diabetes mellitus without complications: Secondary | ICD-10-CM | POA: Diagnosis not present

## 2022-10-30 DIAGNOSIS — M19071 Primary osteoarthritis, right ankle and foot: Secondary | ICD-10-CM | POA: Diagnosis not present

## 2022-10-30 DIAGNOSIS — M2141 Flat foot [pes planus] (acquired), right foot: Secondary | ICD-10-CM

## 2022-10-30 NOTE — Progress Notes (Signed)

## 2022-12-12 ENCOUNTER — Encounter: Payer: Self-pay | Admitting: *Deleted

## 2022-12-12 DIAGNOSIS — Z006 Encounter for examination for normal comparison and control in clinical research program: Secondary | ICD-10-CM

## 2022-12-12 NOTE — Research (Signed)
Participant ID: [id number]  LP(a) Result: 36.5 nmol/L  Date Resulted: October 23, 2022  Date of Coaching: 12-12-2022   [x]  (Please check once complete) Discussed Lp(a) result with participant:    What is Lipoprotein(a) Lp(a)?  What is a "normal" Lp(a) level, and when should I be concerned?  How is Lp(a) related to "bad cholesterol?"  Does a high Lp(a) level increase my risk of heart disease?  How are Lp(a) levels inherited?  Do Lp(a) levels vary by race or ethnicity?  Diagnosing High Lp(a)  What are the signs of high Lp(a)?  How to get an Lp(a) test?  What Lp(a) results are considered high? How do I get a diagnosis of elevated lipoprotein(a)?  How can I lower my Lp(a)?    During the return of results coaching session, all the topics listed above were reviewed with the participant as per the documents: Guidance for Clinician-Patient Lp(a) Risk Assessment Discussion African American Heart Study and Micro-Learning Session: High Lipoprotein(a) 101.  Participant verbalized understanding of test results and what to do next: to ask their healthcare practitioner to test Lp(a) level, advise first-degree relatives to be screened, and work to reduce all cardiovascular risk factors within their control, especially LDL cholesterol.

## 2023-02-11 ENCOUNTER — Other Ambulatory Visit: Payer: Self-pay

## 2023-02-11 MED ORDER — METOPROLOL TARTRATE 25 MG PO TABS: 12.5000 mg | ORAL_TABLET | Freq: Two times a day (BID) | ORAL | 1 refills | Status: DC

## 2023-02-25 ENCOUNTER — Telehealth: Payer: Self-pay | Admitting: Cardiology

## 2023-02-25 NOTE — Telephone Encounter (Signed)
*  STAT* If patient is at the pharmacy, call can be transferred to refill team.   1. Which medications need to be refilled? (please list name of each medication and dose if known)   metoprolol tartrate (LOPRESSOR) 25 MG tablet (completely out for a week now) nitroGLYCERIN (NITROSTAT) 0.4 MG SL tablet (medication expired)  2. Would you like to learn more about the convenience, safety, & potential cost savings by using the [facility]?   3. Are you open to using the [facility] (Type [facility]. ).  4. Which pharmacy/location (including street and city if local pharmacy) is medication to be sent to?  [id number] - [place] ([place]), [place] - [address]   5. Do they need a 30 day or 90 day supply?   90 day

## 2023-02-26 MED ORDER — METOPROLOL TARTRATE 25 MG PO TABS: 12.5000 mg | ORAL_TABLET | Freq: Two times a day (BID) | ORAL | 1 refills | Status: DC

## 2023-02-26 MED ORDER — NITROGLYCERIN 0.4 MG SL SUBL: 0.4000 mg | SUBLINGUAL_TABLET | SUBLINGUAL | 1 refills | Status: AC | PRN

## 2023-03-13 ENCOUNTER — Other Ambulatory Visit: Payer: Self-pay | Admitting: Cardiology

## 2023-05-18 ENCOUNTER — Other Ambulatory Visit: Payer: Self-pay | Admitting: Cardiology

## 2023-07-25 ENCOUNTER — Other Ambulatory Visit: Payer: Self-pay | Admitting: Cardiology

## 2023-08-04 NOTE — Progress Notes (Signed)
 HPI: Follow-up coronary artery disease.  Admitted August 2019 with non-ST elevation myocardial infarction.  Cardiac catheterization at that time showed an 85% distal LAD, 80% proximal RCA, 90% mid RCA, occluded circumflex, occluded marginal.  Patient had PCI of his circumflex and circumflex marginal.  He subsequently had staged PCI of his right coronary artery.  Echocardiogram August 2019 showed ejection fraction 50%, grade 2 diastolic dysfunction. Since last seen, patient denies dyspnea on exertion, orthopnea, PND, pedal edema.  He has lost 30 pounds recently and notes dizziness with standing.  Current Outpatient Medications  Medication Sig Dispense Refill   acetaminophen  (TYLENOL  8 HOUR) 650 MG CR tablet Take 1 tablet (650 mg total) by mouth every 8 (eight) hours as needed for pain. 30 tablet 0   aspirin  EC 81 MG tablet Take 81 mg by mouth daily.     ergocalciferol  (VITAMIN D2) 1.25 MG (50000 UT) capsule Take 1 capsule by mouth once a week.     ezetimibe  (ZETIA ) 10 MG tablet Take 1 tablet (10 mg total) by mouth daily. 90 tablet 3   gabapentin  (NEURONTIN ) 300 MG capsule Take 300 mg by mouth 3 (three) times daily.      lisinopril  (ZESTRIL ) 40 MG tablet TAKE 1 TABLET BY MOUTH DAILY 90 tablet 0   metoprolol  tartrate (LOPRESSOR ) 25 MG tablet Take 0.5 tablets (12.5 mg total) by mouth 2 (two) times daily. 90 tablet 1   Misc Natural Products (GLUCOSAMINE CHOND CMP ADVANCED PO) Take by mouth daily.     nitroGLYCERIN  (NITROSTAT ) 0.4 MG SL tablet Place 1 tablet (0.4 mg total) under the tongue every 5 (five) minutes as needed for chest pain. 75 tablet 1   No current facility-administered medications for this visit.     Past Medical History:  Diagnosis Date   Anxiety    Arthritis    neck, shoulders, lower back (09/15/2017)   Carpal tunnel syndrome    Chronic back pain    neck, lower back (09/15/2017)   Depression    DVT (deep venous thrombosis) (HCC) ~ 2016   following prostate surgery,  treated with course of blood thinners.no blood clot found 2nd us    Erectile dysfunction    Fibromyalgia    Former tobacco use    Heart murmur    born w/one but it closed   Hematuria    Hyperlipidemia    Hypertension    Positive TB test yrs ago age 36's   Prostate cancer (HCC) 2016   a. s/p prostate surgery.   Sciatica    Spinal stenosis    STEMI (ST elevation myocardial infarction) (HCC) 09/13/2017   PCI/DESx1 to the LAD, with staged intervention DESx1 to the RCA, normal EF   Type II diabetes mellitus [facility])     Past Surgical History:  Procedure Laterality Date   ANTERIOR CERVICAL DECOMP/DISCECTOMY FUSION  03/2015   APPENDECTOMY  1995   BILATERAL CARPAL TUNNEL RELEASE Bilateral 2017   30 days apart   CORONARY ANGIOGRAPHY N/A 09/15/2017   Procedure: CORONARY ANGIOGRAPHY;  Surgeon: Dann Candyce RAMAN, MD;  Location: [facility];  Service: Cardiovascular;  Laterality: N/A;   CORONARY ANGIOPLASTY WITH STENT PLACEMENT  09/15/2017   CORONARY STENT INTERVENTION N/A 09/15/2017   Procedure: CORONARY STENT INTERVENTION;  Surgeon: Dann Candyce RAMAN, MD;  Location: [facility];  Service: Cardiovascular;  Laterality: N/A;   CORONARY/GRAFT ACUTE MI REVASCULARIZATION N/A 09/13/2017   Procedure: Coronary/Graft Acute MI Revascularization;  Surgeon: Burnard Debby LABOR, MD;  Location: [facility];  Service: Cardiovascular;  Laterality: N/A;   CYSTOSCOPY WITH FULGERATION N/A 07/07/2019   Procedure: CYSTOSCOPY WITH FULGERATION;  Surgeon: Carolee Sherwood JONETTA DOUGLAS, MD;  Location: [facility];  Service: Urology;  Laterality: N/A;   LEFT HEART CATH AND CORONARY ANGIOGRAPHY N/A 09/13/2017   Procedure: LEFT HEART CATH AND CORONARY ANGIOGRAPHY;  Surgeon: Burnard Debby LABOR, MD;  Location: [facility];  Service: Cardiovascular;  Laterality: N/A;   OTHER SURGICAL HISTORY  2003   genital warts removed   PENILE PROSTHESIS IMPLANT N/A 02/22/2019   Procedure: PENILE PROTHESIS  INFLATABLE COLOPLAST;  Surgeon: Carolee Sherwood JONETTA DOUGLAS, MD;  Location: [facility];  Service: Urology;  Laterality: N/A;   PROSTATECTOMY  2016    Social History   Socioeconomic History   Marital status: Married    Spouse name: Not on file   Number of children: 8   Years of education: Not on file   Highest education level: Not on file  Occupational History   Occupation: truck driver  Tobacco Use   Smoking status: Former    Current packs/day: 0.12    Average packs/day: 0.1 packs/day for 3.0 years (0.4 ttl pk-yrs)    Types: Cigarettes   Smokeless tobacco: Never   Tobacco comments:    09/15/2017 nothing in the 200s  Vaping Use   Vaping status: Never Used  Substance and Sexual Activity   Alcohol use: Not Currently   Drug use: Yes    Types: Marijuana    Comment: marijuana used daily   Sexual activity: Not Currently  Other Topics Concern   Not on file  Social History Narrative   He lives with wife and daughter.   He is currently not working since October 2014.  He was working as a Naval architect, previously was doing long distance, but now only locally.    Social Drivers of Corporate investment banker Strain: Low Risk  (07/18/2022)   Received from [facility]   Overall Financial Resource Strain (CARDIA)    Difficulty of Paying Living Expenses: Not hard at all  Food Insecurity: No Food Insecurity (04/23/2023)   [facility]   Hunger Vital Sign    Within the past 12 months, you worried that your food would run out before you got the money to buy more.: Never true    Within the past 12 months, the food you bought just didn't last and you didn't have money to get more.: Never true  Transportation Needs: No Transportation Needs (04/23/2023)   Received from [facility]   PRAPARE - Transportation    Lack of Transportation (Medical): No    Lack of Transportation (Non-Medical): No  Physical Activity: Unknown (02/13/2021)   Received from [facility] visits prior to 04/06/2022.   Exercise Vital Sign    Days of Exercise per Week: 7 days    Minutes of Exercise per Session: Not on file  Stress: No Stress Concern Present (02/13/2021)   Received from [facility] visits prior to 04/06/2022.   [employer] of Occupational Health - Occupational Stress Questionnaire    Feeling of Stress : Only a little  Social Connections: Moderately Integrated (02/13/2021)   Received from [facility] visits prior to 04/06/2022.   Social Connection and Isolation Panel    Frequency of Communication with Friends and Family: More than three times a week  Frequency of Social Gatherings with Friends and Family: Once a week    Attends Religious Services: 1 to 4 times per year    Active Member of [employer] or Organizations: No    Attends Banker Meetings: Never    Marital Status: Married  Catering manager Violence: Not At Risk (02/13/2021)   Received from [facility] visits prior to 04/06/2022.   Humiliation, Afraid, Rape, and Kick questionnaire    Fear of Current or Ex-Partner: No    Emotionally Abused: No    Physically Abused: No    Sexually Abused: No    Family History  Problem Relation Age of Onset   Diabetes Mother    Arthritis Mother    Heart disease Mother        stents   Diabetes Sister    Cancer Sister    Arthritis Maternal Grandmother     ROS: no fevers or chills, productive cough, hemoptysis, dysphasia, odynophagia, melena, hematochezia, dysuria, hematuria, rash, seizure activity, orthopnea, PND, pedal edema, claudication. Remaining systems are negative.  Physical Exam: Well-developed well-nourished in no acute distress.  Skin is warm and dry.  HEENT is normal.  Neck is supple.  Chest is clear to auscultation with normal expansion.  Cardiovascular exam is regular rate and rhythm.  Abdominal exam nontender or distended. No masses  palpated. Extremities show no edema. neuro grossly intact  EKG Interpretation Date/Time:  Monday August 11 2023 09:27:51 EDT Ventricular Rate:  52 PR Interval:  172 QRS Duration:  88 QT Interval:  422 QTC Calculation: 392 R Axis:   24  Text Interpretation: Sinus bradycardia Confirmed by Pietro Rogue ([id number]) on 08/11/2023 9:34:37 AM    A/P  1 coronary artery disease-patient doing well with no chest pain.  Continue medical therapy with aspirin  and resume statin.  2 hyperlipidemia-continue Zetia .  He discontinued Lipitor  previously due to rash.  Will try Crestor  20 mg daily.  Check lipids and liver in 8 weeks.  Goal LDL less than 55.  3 hypertension-blood pressure controlled.  However he has lost weight recently and he is developing orthostatic symptoms.  Decrease lisinopril  to 20 mg daily and follow.  Rogue Pietro, MD

## 2023-08-11 ENCOUNTER — Encounter: Payer: Self-pay | Admitting: Cardiology

## 2023-08-11 ENCOUNTER — Encounter: Payer: Self-pay | Admitting: *Deleted

## 2023-08-11 ENCOUNTER — Ambulatory Visit: Attending: Cardiology | Admitting: Cardiology

## 2023-08-11 VITALS — BP 100/50 | HR 70 | Resp 16 | Ht 70.0 in | Wt 223.4 lb

## 2023-08-11 DIAGNOSIS — I2510 Atherosclerotic heart disease of native coronary artery without angina pectoris: Secondary | ICD-10-CM | POA: Diagnosis not present

## 2023-08-11 DIAGNOSIS — I10 Essential (primary) hypertension: Secondary | ICD-10-CM | POA: Diagnosis not present

## 2023-08-11 DIAGNOSIS — E785 Hyperlipidemia, unspecified: Secondary | ICD-10-CM

## 2023-08-11 DIAGNOSIS — Z9861 Coronary angioplasty status: Secondary | ICD-10-CM | POA: Diagnosis not present

## 2023-08-11 MED ORDER — EZETIMIBE 10 MG PO TABS: 10.0000 mg | ORAL_TABLET | Freq: Every day | ORAL | 3 refills | Status: AC

## 2023-08-11 MED ORDER — ROSUVASTATIN CALCIUM 20 MG PO TABS: 20.0000 mg | ORAL_TABLET | Freq: Every day | ORAL | 3 refills | Status: AC

## 2023-08-11 MED ORDER — LISINOPRIL 20 MG PO TABS
20.0000 mg | ORAL_TABLET | Freq: Every day | ORAL | 3 refills | Status: AC
Start: 2023-08-11 — End: ?

## 2023-08-11 MED ORDER — METOPROLOL TARTRATE 25 MG PO TABS: 12.5000 mg | ORAL_TABLET | Freq: Two times a day (BID) | ORAL | 3 refills | Status: AC

## 2023-08-11 NOTE — Patient Instructions (Signed)
 Medication Instructions:   START ROSUVASTATIN  20 MG ONCE DAILY  DECREASE LISINOPRIL  TO 20 MG ONCE DAILY= 1/2 OF THE 40 MG TABLET ONCE DAILY  *If you need a refill on your cardiac medications before your next appointment, please call your pharmacy*  Lab Work:  Your physician recommends that you return for lab work in: 8 [facility]  If you have labs (blood work) drawn today and your tests are completely normal, you will receive your results only by: MyChart Message (if you have MyChart) OR A paper copy in the mail If you have any lab test that is abnormal or we need to change your treatment, we will call you to review the results.   Follow-Up: At [facility], you and your health needs are our priority.  As part of our continuing mission to provide you with exceptional heart care, our providers are all part of one team.  This team includes your primary Cardiologist (physician) and Advanced Practice Providers or APPs (Physician Assistants and Nurse Practitioners) who all work together to provide you with the care you need, when you need it.  Your next appointment:   12 month(s)  Provider:   Redell Shallow, MD

## 2023-09-25 ENCOUNTER — Ambulatory Visit (INDEPENDENT_AMBULATORY_CARE_PROVIDER_SITE_OTHER): Payer: 59 | Admitting: Podiatry

## 2023-09-25 ENCOUNTER — Encounter: Payer: Self-pay | Admitting: Podiatry

## 2023-09-25 DIAGNOSIS — M79675 Pain in left toe(s): Secondary | ICD-10-CM

## 2023-09-25 DIAGNOSIS — L84 Corns and callosities: Secondary | ICD-10-CM | POA: Diagnosis not present

## 2023-09-25 DIAGNOSIS — M2142 Flat foot [pes planus] (acquired), left foot: Secondary | ICD-10-CM

## 2023-09-25 DIAGNOSIS — B351 Tinea unguium: Secondary | ICD-10-CM | POA: Diagnosis not present

## 2023-09-25 DIAGNOSIS — M79674 Pain in right toe(s): Secondary | ICD-10-CM

## 2023-09-25 DIAGNOSIS — M2141 Flat foot [pes planus] (acquired), right foot: Secondary | ICD-10-CM

## 2023-09-25 DIAGNOSIS — E1140 Type 2 diabetes mellitus with diabetic neuropathy, unspecified: Secondary | ICD-10-CM

## 2023-09-25 NOTE — Progress Notes (Signed)
  Subjective:  Patient ID: [name], male    DOB: [birth year],  MRN: [id number]  Chief Complaint  Patient presents with   Follow-up    Patient states that everything has been going good since last visit, other then the Callous on his right foot lateral upper side is some what painful. No medication for pain     63 y.o. male presents with the above complaint. History confirmed with patient.  He comes in for annual diabetic foot exam.  He reports some painful calluses right subfifth metatarsal head and bilateral first toes plantar medial aspect.  Patient presenting with pain related to dystrophic thickened elongated nails. Patient is unable to trim own nails related to nail dystrophy and mobility issues due to back pain. Patient does have a history of T2DM last A1c 5.6.  He has been on Ozempic.  He does report neuropathy and takes gabapentin . Objective:  Physical Exam: warm, good capillary refill, pedal hair growth absent, some atrophic changes to pedal skin nail exam onychomycosis of the toenails, onycholysis, and dystrophic nails DP pulses palpable, PT pulses palpable, protective sensation decreased, and vibratory sensation diminished, subjective paresthesias Left Foot:  Pain with palpation of nails due to elongation and dystrophic growth.  Painful callus present plantar medial aspect of the first there phalangeal joint Right Foot: Pain with palpation of nails due to elongation and dystrophic growth. Painful callus present plantar medial aspect of the first there phalangeal joint and subfifth metatarsal head. Pes planus foot type noted bilaterally.  Muscle strength 5/5 for all major muscle groups.  Assessment:   1. Pes planus of both feet   2. Callus of foot   3. Controlled type 2 diabetes mellitus with neuropathy (HCC)   4. Pain due to onychomycosis of toenails of both feet      Plan:  Patient was evaluated and treated and all questions answered.  #Pre ulcerative calluses present  right subfifth metatarsal head and bilateral first toe plantar medial interphalangeal joint level All symptomatic hyperkeratoses x 3 separate lesions were safely debrided with a sterile #312 blade to patient's level of comfort without incident. We discussed preventative and palliative care of these lesions including supportive and accommodative shoegear, padding, prefabricated and custom molded accommodative orthoses, use of a pumice stone and lotions/creams daily.  #Onychomycosis with pain  -Nails palliatively debrided as below. -Educated on self-care  Procedure: Nail Debridement Rationale: Pain Type of Debridement: manual, sharp debridement. Instrumentation: Nail nipper, rotary burr. Number of Nails: 10  # Diabetes with neuropathy # Bilateral pes planus -Patient educated on diabetes. Discussed proper diabetic foot care and discussed risks and complications of disease. Educated patient in depth on reasons to return to the office immediately should he/she discover anything concerning or new on the feet. All questions answered. Discussed proper shoes as well.  - Patient would benefit from custom diabetic shoes with inserts to offload the areas forming callus.  Sending in prescription to [facility] -I certify that this diagnosis represents a distinct and separate diagnosis that requires evaluation and treatment separate from other procedures or diagnosis    Return in about 3 months (around 12/26/2023) for Diabetic Foot Care.         Ethan Saddler, DPM [facility]

## 2023-10-13 ENCOUNTER — Telehealth: Payer: Self-pay | Admitting: Podiatry

## 2023-10-13 NOTE — Telephone Encounter (Signed)
 Prentice handed me [facility], ppwk needs to be signed by Dr. Lamount it is in his mailbox

## 2023-10-27 LAB — LIPID PANEL

## 2023-10-28 ENCOUNTER — Ambulatory Visit: Payer: Self-pay | Admitting: Cardiology

## 2023-10-28 LAB — HEPATIC FUNCTION PANEL
ALT: 15 IU/L (ref 0–44)
AST: 28 IU/L (ref 0–40)
Albumin: 3.9 g/dL (ref 3.9–4.9)
Alkaline Phosphatase: 80 IU/L (ref 47–123)
Bilirubin Total: 0.3 mg/dL (ref 0.0–1.2)
Bilirubin, Direct: 0.13 mg/dL (ref 0.00–0.40)
Total Protein: 6.9 g/dL (ref 6.0–8.5)

## 2023-10-28 LAB — LIPID PANEL
Cholesterol, Total: 119 mg/dL (ref 100–199)
HDL: 59 mg/dL (ref >39)
LDL CALC COMMENT:: 2 ratio (ref 0.0–5.0)
LDL Chol Calc (NIH): 47 mg/dL (ref 0–99)
Triglycerides: 58 mg/dL (ref 0–149)
VLDL Cholesterol Cal: 13 mg/dL (ref 5–40)

## 2023-11-10 ENCOUNTER — Telehealth: Payer: Self-pay | Admitting: Podiatry

## 2023-11-10 NOTE — Telephone Encounter (Signed)
 Faxed standard written order for shoes and inserts  and statement of certifying physician to [facility] 11/10/23

## 2023-12-26 ENCOUNTER — Ambulatory Visit (INDEPENDENT_AMBULATORY_CARE_PROVIDER_SITE_OTHER): Payer: Self-pay | Admitting: Podiatry

## 2023-12-26 DIAGNOSIS — Z91199 Patient's noncompliance with other medical treatment and regimen due to unspecified reason: Secondary | ICD-10-CM

## 2023-12-29 NOTE — Progress Notes (Signed)
Patient did not show for scheduled appointment today.
